# Patient Record
Sex: Female | Born: 1945 | Race: White | Hispanic: No | Smoking: Former smoker
Health system: Southern US, Community
[De-identification: ages and names within clinical notes are randomized; demographics above are authoritative.]

## PROBLEM LIST (undated history)

## (undated) DIAGNOSIS — G500 Trigeminal neuralgia: Secondary | ICD-10-CM

## (undated) DIAGNOSIS — R7303 Prediabetes: Secondary | ICD-10-CM

## (undated) DIAGNOSIS — I10 Essential (primary) hypertension: Secondary | ICD-10-CM

---

## 2018-08-02 HISTORY — PX: CEREBRAL MICROVASCULAR DECOMPRESSION: SHX1328

## 2021-12-30 ENCOUNTER — Emergency Department (HOSPITAL_COMMUNITY): Payer: Medicare Other

## 2021-12-30 ENCOUNTER — Inpatient Hospital Stay (HOSPITAL_COMMUNITY)
Admission: EM | Admit: 2021-12-30 | Discharge: 2022-01-03 | DRG: 025 | Disposition: A | Discharge status: Home / Self Care | Payer: Medicare Other | Attending: Family Medicine | Admitting: Family Medicine

## 2021-12-30 ENCOUNTER — Encounter (HOSPITAL_COMMUNITY): Payer: Self-pay | Admitting: Emergency Medicine

## 2021-12-30 DIAGNOSIS — R9431 Abnormal electrocardiogram [ECG] [EKG]: Secondary | ICD-10-CM | POA: Diagnosis present

## 2021-12-30 DIAGNOSIS — Z885 Allergy status to narcotic agent status: Secondary | ICD-10-CM

## 2021-12-30 DIAGNOSIS — Z833 Family history of diabetes mellitus: Secondary | ICD-10-CM

## 2021-12-30 DIAGNOSIS — F40240 Claustrophobia: Secondary | ICD-10-CM | POA: Diagnosis present

## 2021-12-30 DIAGNOSIS — C8519 Unspecified B-cell lymphoma, extranodal and solid organ sites: Secondary | ICD-10-CM | POA: Diagnosis not present

## 2021-12-30 DIAGNOSIS — R7303 Prediabetes: Secondary | ICD-10-CM | POA: Diagnosis present

## 2021-12-30 DIAGNOSIS — G936 Cerebral edema: Secondary | ICD-10-CM | POA: Diagnosis present

## 2021-12-30 DIAGNOSIS — Z9181 History of falling: Secondary | ICD-10-CM

## 2021-12-30 DIAGNOSIS — G9389 Other specified disorders of brain: Principal | ICD-10-CM

## 2021-12-30 DIAGNOSIS — I471 Supraventricular tachycardia: Secondary | ICD-10-CM | POA: Diagnosis present

## 2021-12-30 DIAGNOSIS — I4891 Unspecified atrial fibrillation: Secondary | ICD-10-CM | POA: Diagnosis present

## 2021-12-30 DIAGNOSIS — I10 Essential (primary) hypertension: Secondary | ICD-10-CM | POA: Diagnosis present

## 2021-12-30 DIAGNOSIS — Z20822 Contact with and (suspected) exposure to covid-19: Secondary | ICD-10-CM | POA: Diagnosis present

## 2021-12-30 DIAGNOSIS — Z8249 Family history of ischemic heart disease and other diseases of the circulatory system: Secondary | ICD-10-CM

## 2021-12-30 DIAGNOSIS — Z79899 Other long term (current) drug therapy: Secondary | ICD-10-CM

## 2021-12-30 DIAGNOSIS — I4719 Other supraventricular tachycardia: Secondary | ICD-10-CM | POA: Diagnosis present

## 2021-12-30 DIAGNOSIS — Z7983 Long term (current) use of bisphosphonates: Secondary | ICD-10-CM

## 2021-12-30 DIAGNOSIS — M25551 Pain in right hip: Secondary | ICD-10-CM | POA: Diagnosis present

## 2021-12-30 DIAGNOSIS — Z87891 Personal history of nicotine dependence: Secondary | ICD-10-CM

## 2021-12-30 DIAGNOSIS — G939 Disorder of brain, unspecified: Secondary | ICD-10-CM | POA: Diagnosis present

## 2021-12-30 DIAGNOSIS — M25511 Pain in right shoulder: Secondary | ICD-10-CM | POA: Diagnosis present

## 2021-12-30 DIAGNOSIS — Z888 Allergy status to other drugs, medicaments and biological substances status: Secondary | ICD-10-CM

## 2021-12-30 HISTORY — DX: Prediabetes: R73.03

## 2021-12-30 HISTORY — DX: Trigeminal neuralgia: G50.0

## 2021-12-30 HISTORY — DX: Essential (primary) hypertension: I10

## 2021-12-30 LAB — TROPONIN I (HIGH SENSITIVITY)
Troponin I (High Sensitivity): 9 ng/L (ref <18)
Troponin I (High Sensitivity): 14 ng/L (ref <18)

## 2021-12-30 LAB — CBC WITH DIFFERENTIAL/PLATELET
Abs Immature Granulocytes: 0.06 10*3/uL (ref 0.00–0.07)
Basophils Absolute: 0 10*3/uL (ref 0.0–0.1)
Basophils Relative: 0 %
Eosinophils Absolute: 0 10*3/uL (ref 0.0–0.5)
Eosinophils Relative: 0 %
HCT: 43.6 % (ref 36.0–46.0)
Hemoglobin: 13.6 g/dL (ref 12.0–15.0)
Immature Granulocytes: 1 %
Lymphocytes Relative: 6 %
Lymphs Abs: 0.7 10*3/uL (ref 0.7–4.0)
MCH: 27.8 pg (ref 26.0–34.0)
MCHC: 31.2 g/dL (ref 30.0–36.0)
MCV: 89 fL (ref 80.0–100.0)
Monocytes Absolute: 0.7 10*3/uL (ref 0.1–1.0)
Monocytes Relative: 6 %
Neutro Abs: 10.2 10*3/uL — ABNORMAL HIGH (ref 1.7–7.7)
Neutrophils Relative %: 87 %
Platelets: 265 10*3/uL (ref 150–400)
RBC: 4.9 MIL/uL (ref 3.87–5.11)
RDW: 12.1 % (ref 11.5–15.5)
WBC: 11.7 10*3/uL — ABNORMAL HIGH (ref 4.0–10.5)
nRBC: 0 % (ref 0.0–0.2)

## 2021-12-30 LAB — COMPREHENSIVE METABOLIC PANEL
ALT: 38 U/L (ref 0–44)
AST: 31 U/L (ref 15–41)
Albumin: 3.6 g/dL (ref 3.5–5.0)
Alkaline Phosphatase: 59 U/L (ref 38–126)
Anion gap: 10 (ref 5–15)
BUN: 13 mg/dL (ref 8–23)
CO2: 24 mmol/L (ref 22–32)
Calcium: 8.6 mg/dL — ABNORMAL LOW (ref 8.9–10.3)
Chloride: 102 mmol/L (ref 98–111)
Creatinine, Ser: 0.82 mg/dL (ref 0.44–1.00)
GFR, Estimated: >60 mL/min (ref >60)
Glucose, Bld: 152 mg/dL — ABNORMAL HIGH (ref 70–99)
Potassium: 3.6 mmol/L (ref 3.5–5.1)
Sodium: 136 mmol/L (ref 135–145)
Total Bilirubin: 1.1 mg/dL (ref 0.3–1.2)
Total Protein: 6.3 g/dL — ABNORMAL LOW (ref 6.5–8.1)

## 2021-12-30 LAB — RESP PANEL BY RT-PCR (FLU A&B, COVID) ARPGX2
Influenza A by PCR: NEGATIVE
Influenza B by PCR: NEGATIVE
SARS Coronavirus 2 by RT PCR: NEGATIVE

## 2021-12-30 LAB — CBG MONITORING, ED: Glucose-Capillary: 148 mg/dL — ABNORMAL HIGH (ref 70–99)

## 2021-12-30 LAB — CK: Total CK: 105 U/L (ref 38–234)

## 2021-12-30 IMAGING — MR MR HEAD W/O CM
11 of 12 series · 43 of 48 positions shown · non-contrast
Comparison: Prior CT from earlier the same day as well as previous
MRI from [DATE].

CLINICAL DATA: Follow-up examination for abnormal CT, possible
intracranial mass.

EXAM:
MRI HEAD WITHOUT CONTRAST
TECHNIQUE: Multiplanar, multiecho pulse sequences of the brain and surrounding
structures were obtained without intravenous contrast.

[Series 11: DWI · axial · 3.0mm · 0.88mm/px · z∈[-94,+51]mm · 7 of 100 slices shown (1 of 4)]
[im 1/100]
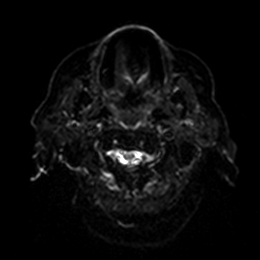
[im 17/100]
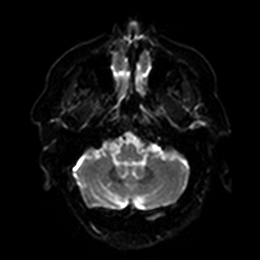
[im 34/100]
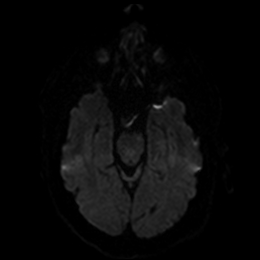
[im 50/100]
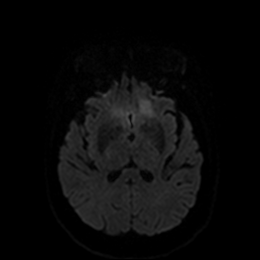
[im 67/100]
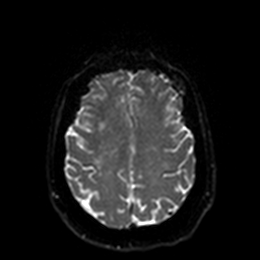
[im 83/100]
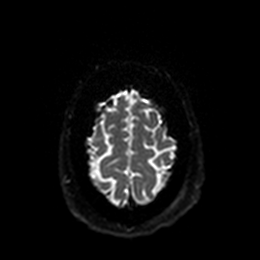
[im 100/100]
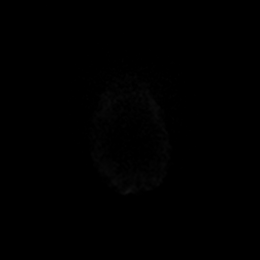

[Series 12: DWI · axial · 3.0mm · 0.88mm/px · z∈[-94,+51]mm · 4 of 50 slices shown (2 of 4)]
[im 1/50]
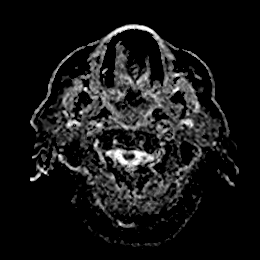
[im 17/50]
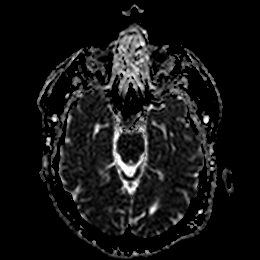
[im 33/50]
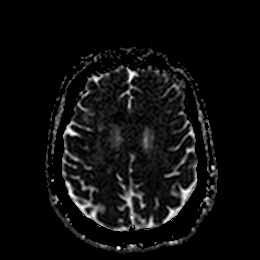
[im 50/50]
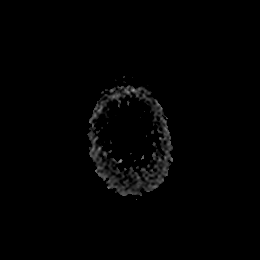

[Series 13: DWI · coronal · 4.0mm · 0.88mm/px · 5 of 68 slices shown (3 of 4)]
[im 1/68]
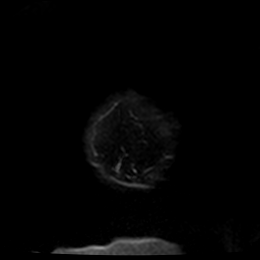
[im 17/68]
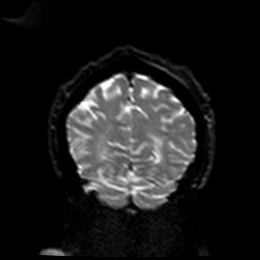
[im 34/68]
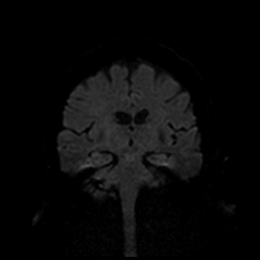
[im 51/68]
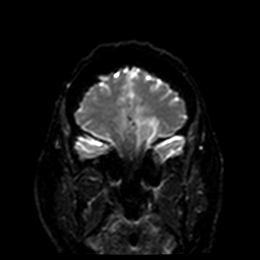
[im 68/68]
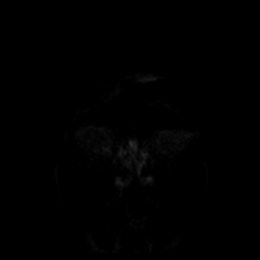

[Series 14: DWI · coronal · 4.0mm · 0.88mm/px · 3 of 34 slices shown (4 of 4)]
[im 1/34]
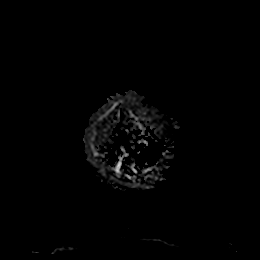
[im 17/34]
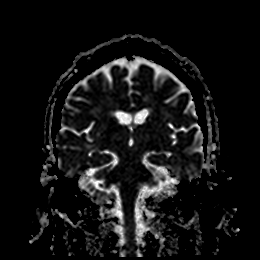
[im 34/34]
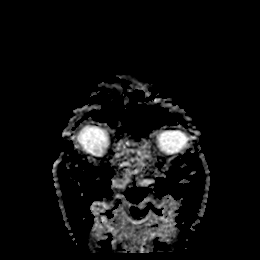

[Series 15: T1 · sagittal · 5.0mm · 0.75mm/px · 2 of 26 slices shown]
[im 1/26]
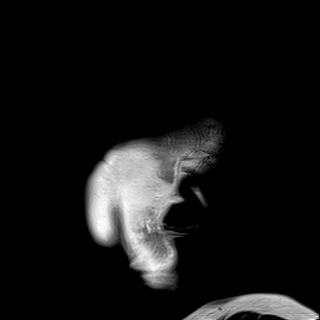
[im 26/26]
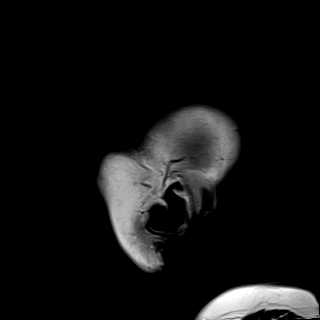

[Series 16: T2 · axial · 5.0mm · 0.72mm/px · z∈[-110,+51]mm · 2 of 28 slices shown]
[im 1/28]
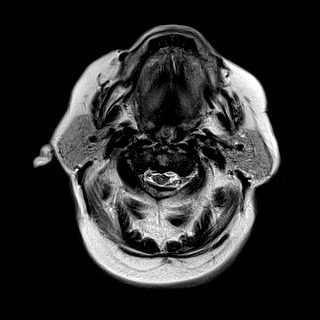
[im 28/28]
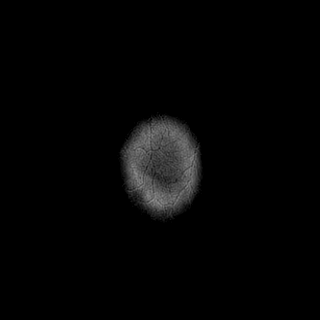

[Series 17: FLAIR · axial · 5.0mm · 0.45mm/px · z∈[-111,+51]mm · 2 of 28 slices shown]
[im 1/28]
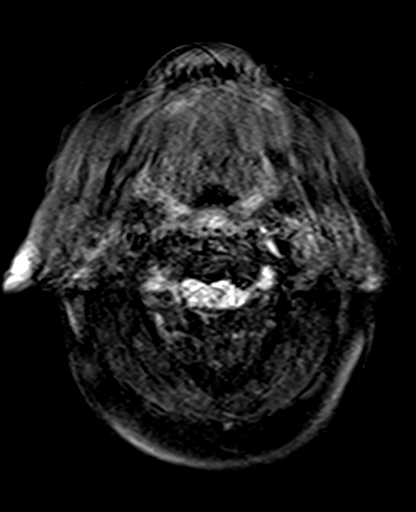
[im 28/28]
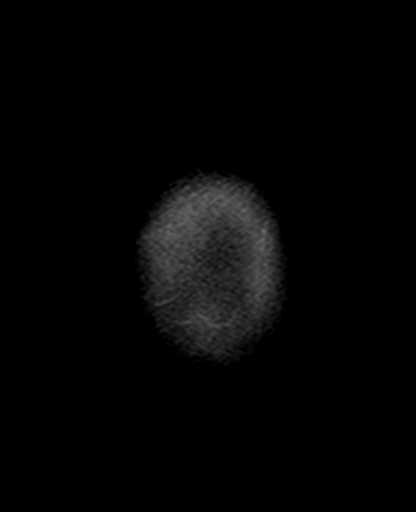

[Series 18: mag_images · axial · 3.0mm · 0.90mm/px · z∈[-116,+60]mm · 5 of 60 slices shown]
[im 1/60]
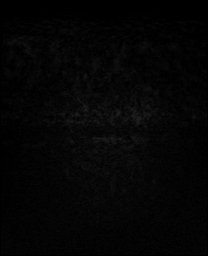
[im 15/60]
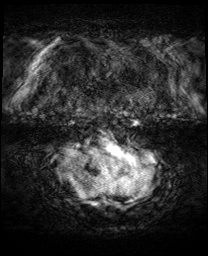
[im 30/60]
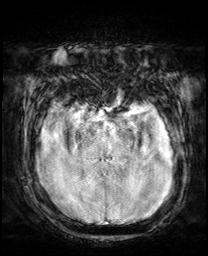
[im 45/60]
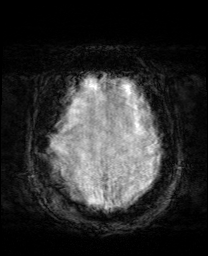
[im 60/60]
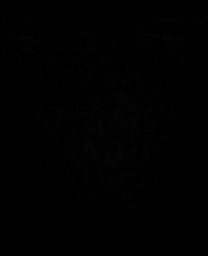

[Series 19: pha_images · axial · 3.0mm · 0.90mm/px · z∈[-96,+57]mm · 4 of 52 slices shown]
[im 1/52]
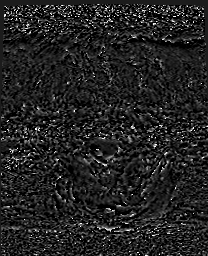
[im 18/52]
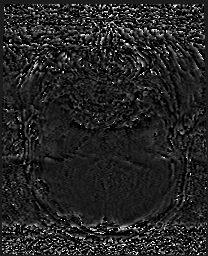
[im 35/52]
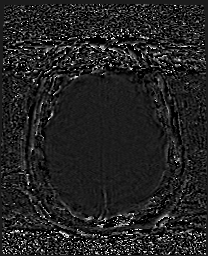
[im 52/52]
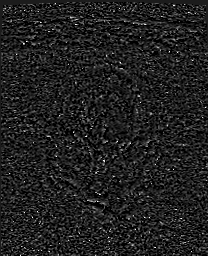

[Series 20: swi_images · axial · 3.0mm · 0.90mm/px · z∈[-116,+60]mm · 5 of 60 slices shown]
[im 1/60]
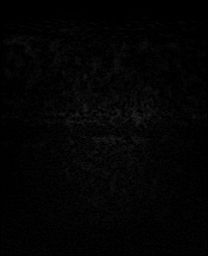
[im 15/60]
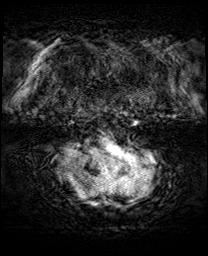
[im 30/60]
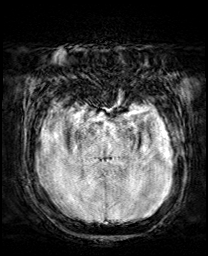
[im 45/60]
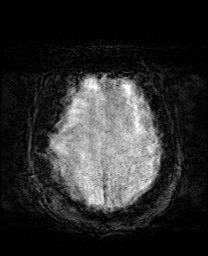
[im 60/60]
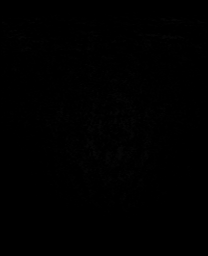

[Series 21: mip_images(sw) · axial · 24.0mm · 0.90mm/px · z∈[-106,+49]mm · 4 of 53 slices shown]
[im 1/53]
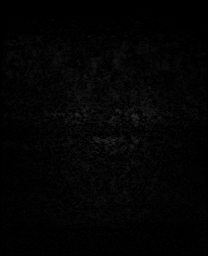
[im 18/53]
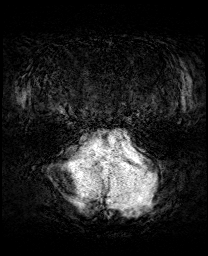
[im 35/53]
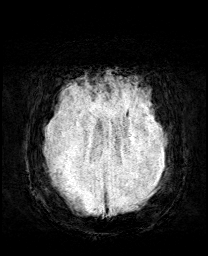
[im 53/53]
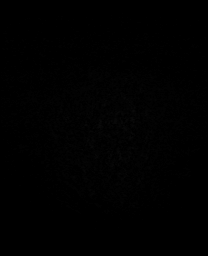

[43 of 48 positions shown; findings below may reference images not displayed]

FINDINGS: Brain: Examination technically limited as the patient was unable to
tolerate the full length of the exam, postcontrast sequences were
not performed. Additionally, images provided are moderately to
severely intermittently degraded by motion artifact.

Diffuse prominence of the CSF containing spaces compatible
generalized cerebral atrophy. Patchy and confluent T2/FLAIR
hyperintensity involving the periventricular deep white matter both
cerebral hemispheres most consistent with chronic small vessel
ischemic disease, moderate in nature. Small remote right cerebellar
infarct noted.

There is suggestion of a focal intraparenchymal abnormality centered
at the anterior/inferior left frontal lobe at the left gyrus rectus,
corresponding with abnormality on prior CT. This measures
approximately 2.3 x 1.3 cm (series 16, image 16). Surrounding
T2/FLAIR signal abnormality consistent with vasogenic edema. Edema
extends along the genu of the corpus callosum, crossing the midline
to involve the contralateral right frontal lobe (series 17, image
17). No definite intrinsic T1 hyperintensity. No visible associated
susceptibility artifact, although evaluation markedly limited by
motion. Finding is indeterminate, and could reflect an
intraparenchymal mass, possibly a primary CNS neoplasm or solitary
intracranial metastasis. A possible subacute hematoma is also
considered, although evaluation limited given artifact on this exam.

No other visible mass lesion, mass effect, or midline shift. No
hydrocephalus or extra-axial fluid collection. No other foci of
diffusion abnormality to suggest acute or subacute ischemia.
Gray-white matter differentiation otherwise maintained.

Pituitary gland suprasellar region normal. Midline structures
intact.

Vascular: Major intracranial vascular flow voids are grossly
maintained at the skull base.

Skull and upper cervical spine: Prominent degenerative
thickening/pannus formation noted at the tectorial membrane and
dens. Craniocervical junction otherwise unremarkable. Bone marrow
signal intensity within normal limits. No focal marrow replacing
lesion. Hyperostosis frontalis interna noted. Prior right
retrosigmoid craniotomy noted. No acute scalp soft tissue
abnormality.

Sinuses/Orbits: Patient status post bilateral ocular lens
replacement. Scattered mucosal thickening noted within the ethmoidal
air cells. Paranasal sinuses are otherwise clear. No mastoid
effusion.

Other: None.
IMPRESSION: 1. Technically limited exam due to motion artifact and the patient's
inability to tolerate the full length of the study. Post-contrast
sequences were not performed.
2. Approximate 2.3 x 1.3 cm intraparenchymal abnormality centered at
the anterior/inferior left frontal lobe, corresponding with
abnormality on prior CT. Surrounding vasogenic edema without midline
shift. Finding is indeterminate, and could reflect an
intraparenchymal mass, possibly a primary CNS neoplasm or solitary
intracranial metastasis. A possible evolving subacute hematoma is
also considered. Further assessment with postcontrast imaging as the
patient is able to tolerate is recommended. Additionally, a short
interval follow-up MRI to evaluate for evolutionary changes may be
helpful as well.
3. No other acute intracranial abnormality.
4. Underlying atrophy with moderate chronic small vessel ischemic
disease.

## 2021-12-30 IMAGING — CT CT HEAD W/O CM
4 series · 15 of 47 positions shown, 17 images · non-contrast
Comparison: MRI head [DATE]

CLINICAL DATA: Status post fall

EXAM:
CT HEAD WITHOUT CONTRAST
CT CERVICAL SPINE WITHOUT CONTRAST
TECHNIQUE: Multidetector CT imaging of the head and cervical spine was
performed following the standard protocol without intravenous
contrast. Multiplanar CT image reconstructions of the cervical spine
were also generated.

[Series 3: head bone · axial · 0.49mm/px · z∈[-104,-88]mm · 2 of 78 slices shown]
[im 8/78  bone]
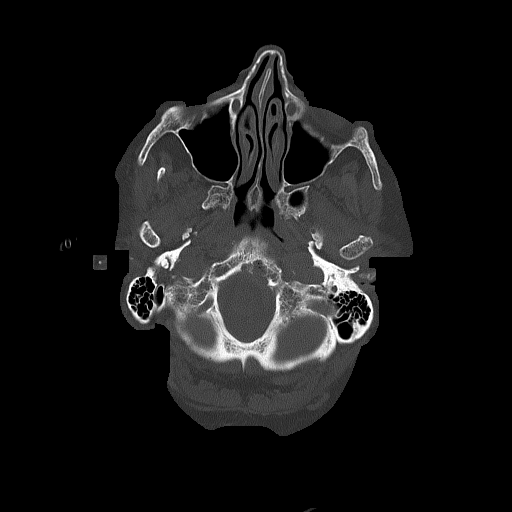
[im 16/78  bone]
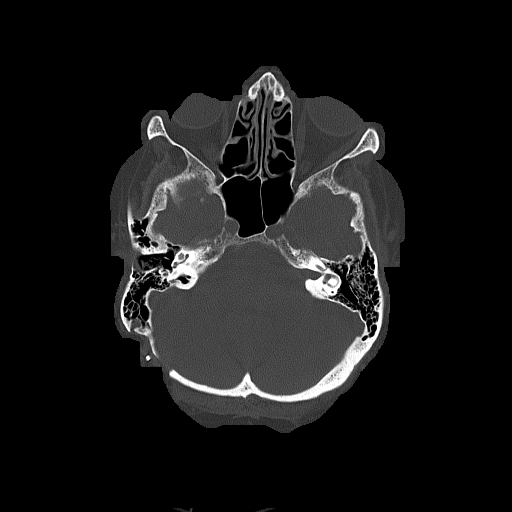

[Series 4: head without · axial · non-contrast · 0.49mm/px · z∈[-103,+17]mm · 7 of 32 slices shown, 9 images]
[im 4/32  brain]
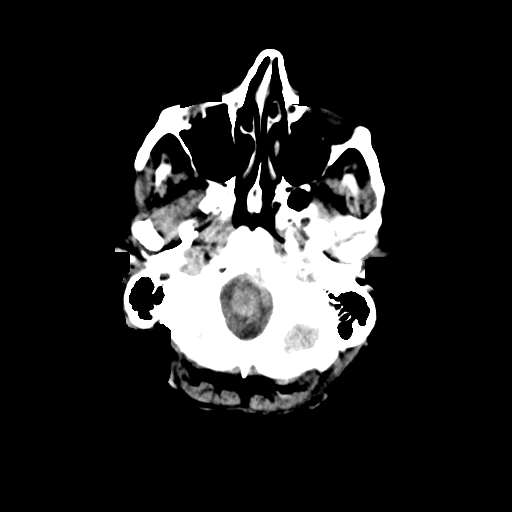
[im 4/32  bone]
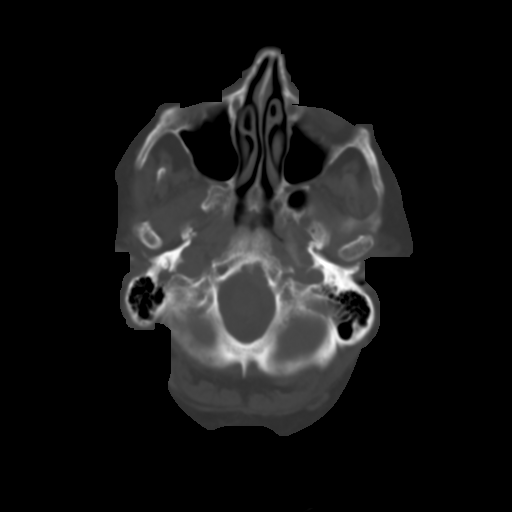
[im 8/32  brain]
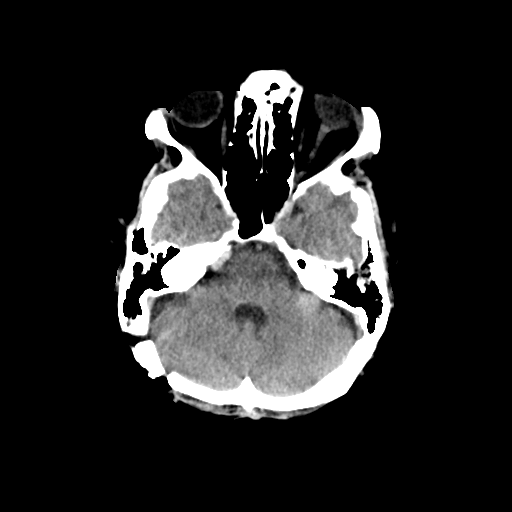
[im 12/32  brain]
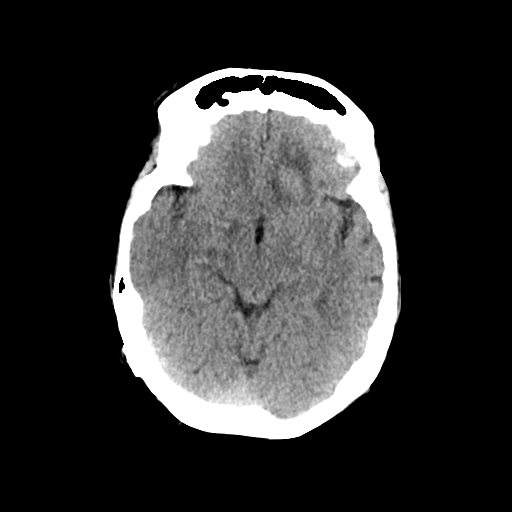
[im 16/32  brain]
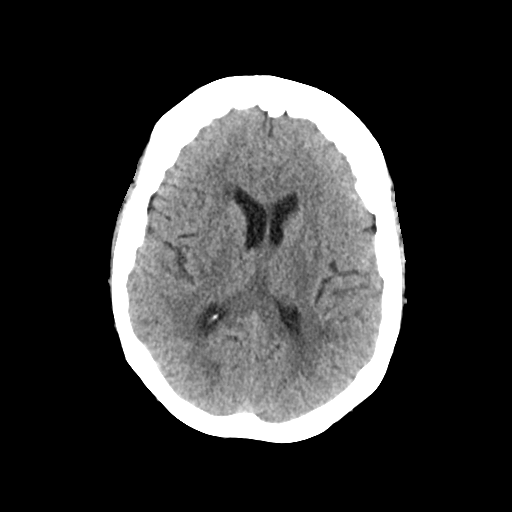
[im 20/32  brain]
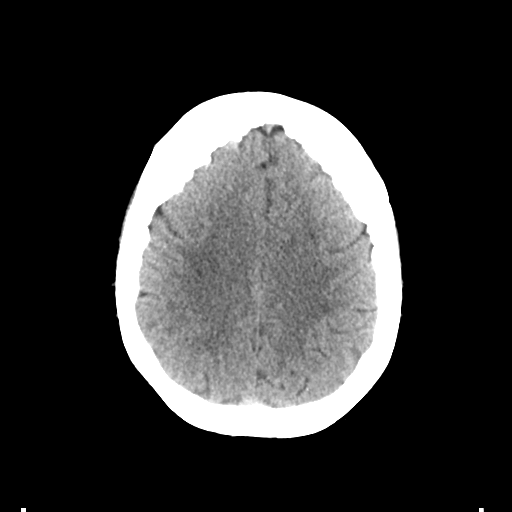
[im 20/32  bone]
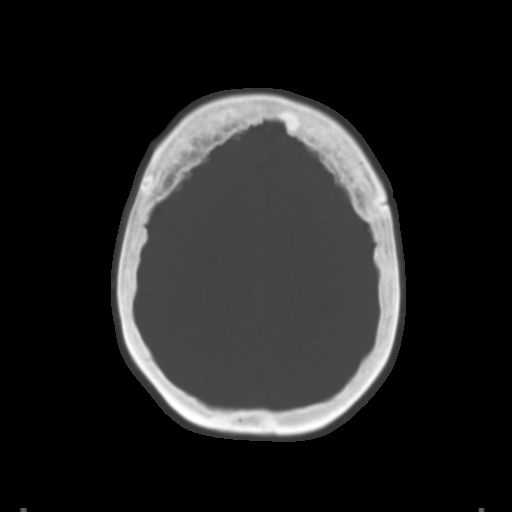
[im 24/32  brain]
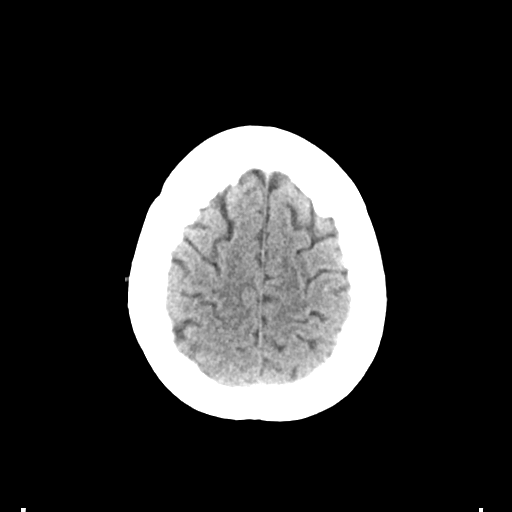
[im 28/32  brain]
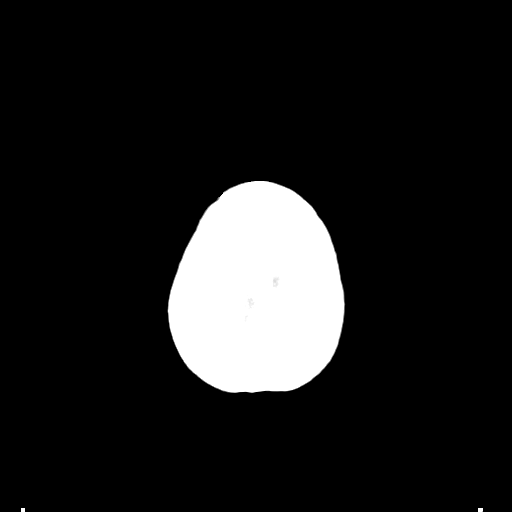

[Series 5: head without cor · coronal · non-contrast · 0.32mm/px · 3 of 67 slices shown]
[im 23/67  brain]
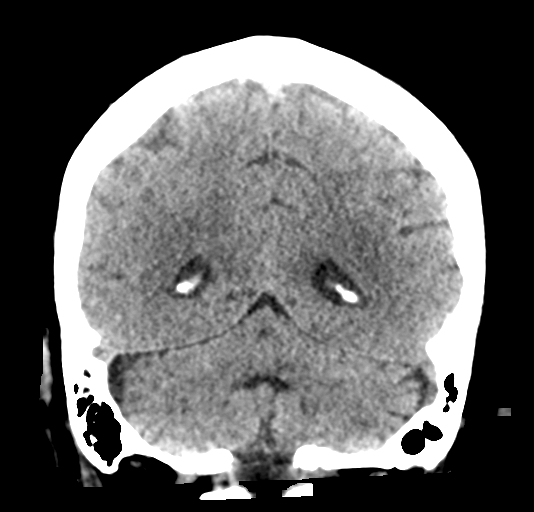
[im 30/67  brain]
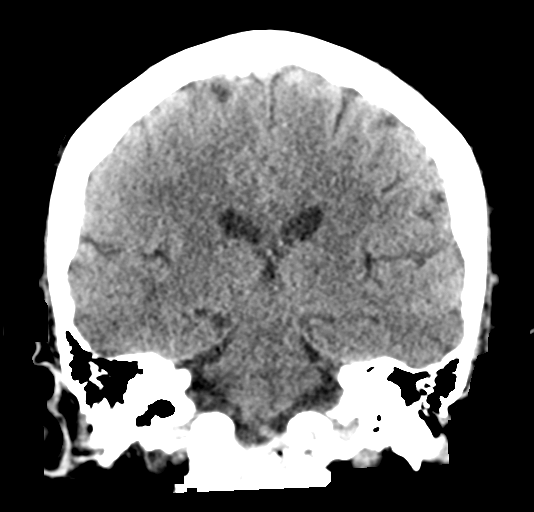
[im 37/67  brain]
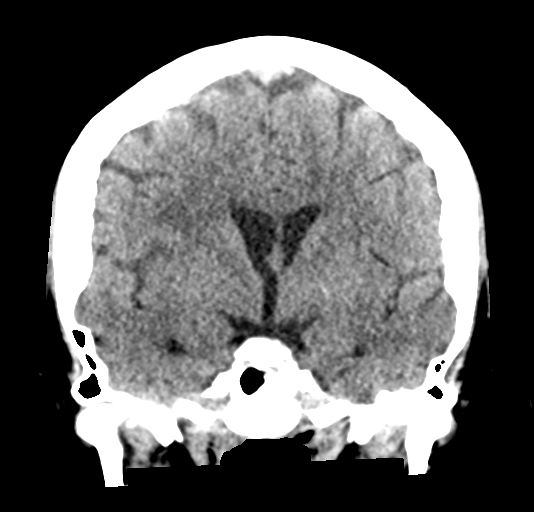

[Series 6: head without sag · sagittal · non-contrast · 0.31mm/px · 3 of 67 slices shown]
[im 23/67  brain]
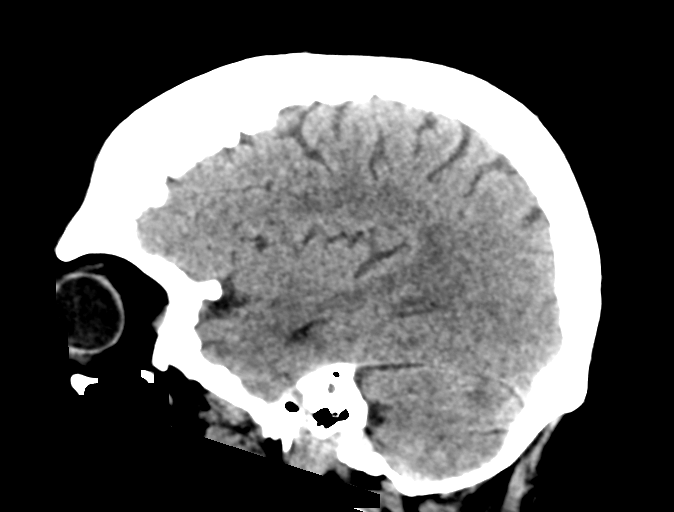
[im 34/67  brain]
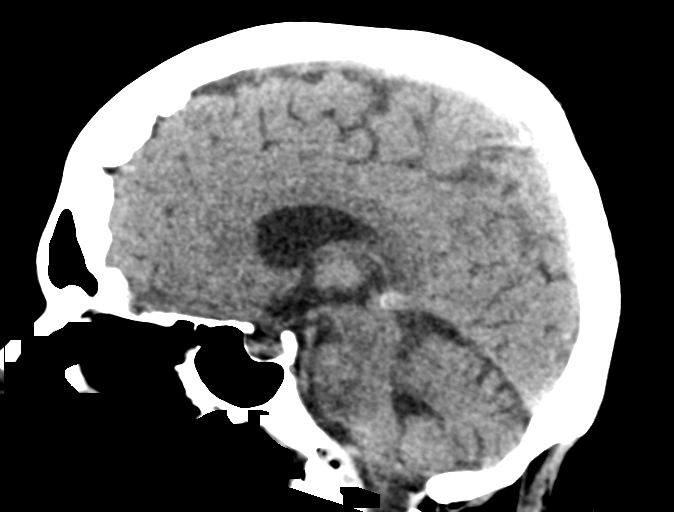
[im 45/67  brain]
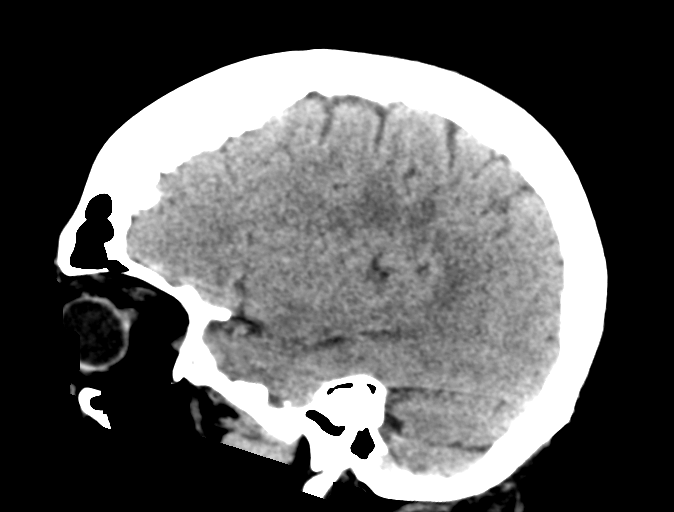

[15 of 47 positions shown; findings below may reference images not displayed]

FINDINGS: CT HEAD FINDINGS

BRAIN:
BRAIN
Patchy and confluent areas of decreased attenuation are noted
throughout the deep and periventricular white matter of the cerebral
hemispheres bilaterally, compatible with chronic microvascular
ischemic disease.

No evidence of large-territorial acute infarction. No parenchymal
hemorrhage. Suggestion of a 1.9 x 1.2 x 2cm left inferior frontal
lobe lesion with surrounding vasogenic edema. No extra-axial
collection.

No mass effect or midline shift. No hydrocephalus. Basilar cisterns
are patent.

Vascular: No hyperdense vessel.

Skull: No acute fracture or focal lesion.

Sinuses/Orbits: Paranasal sinuses and mastoid air cells are clear.
The orbits are unremarkable.

Other: None.

CT CERVICAL SPINE FINDINGS

Alignment: Normal.

Skull base and vertebrae: Pannus at the C1-C2 level with associated
erosive changes of the skull base/anterior foramen magnum.
Multilevel degenerative changes of the spine. Lytic lesion along the
base of the dens of unclear etiology. No acute fracture. No
aggressive appearing focal osseous lesion or focal pathologic
process.

Soft tissues and spinal canal: No prevertebral fluid or swelling. No
visible canal hematoma.

Upper chest: Unremarkable.

Other: None.
IMPRESSION: 1. In 1.9 x 1.2 x 2 cm left inferior frontal lobe lesion with
surrounding vasogenic edema. Recommend MRI brain with and without
contrast for further evaluation.
2. No acute displaced fracture or traumatic listhesis of the
cervical spine.
3. Lytic lesion along the base of the dens of unclear etiology.

## 2021-12-30 IMAGING — DX DG HIP (WITH OR WITHOUT PELVIS) 2-3V*L*
3 series · 3 of 3 positions shown · non-contrast
Comparison: None.

CLINICAL DATA: fall, back/hip pain

EXAM:
DG HIP (WITH OR WITHOUT PELVIS) 2-3V RIGHT; DG HIP (WITH OR WITHOUT
PELVIS) 2-3V LEFT

[hip lat]
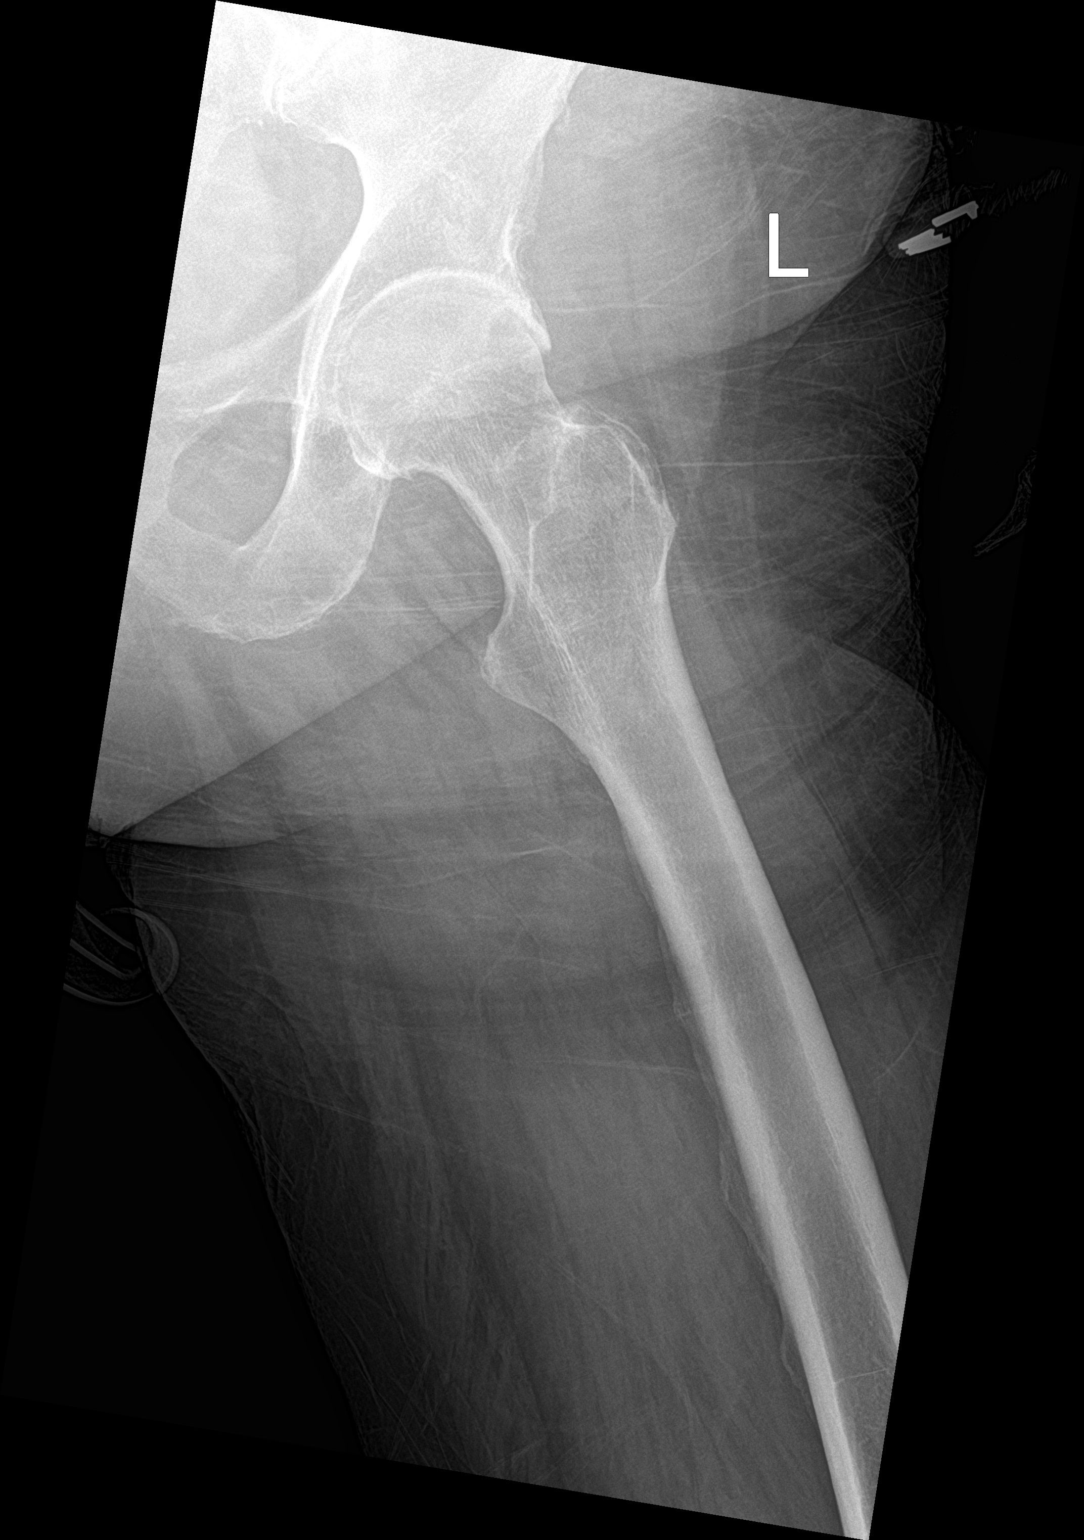

[pelvis ap]
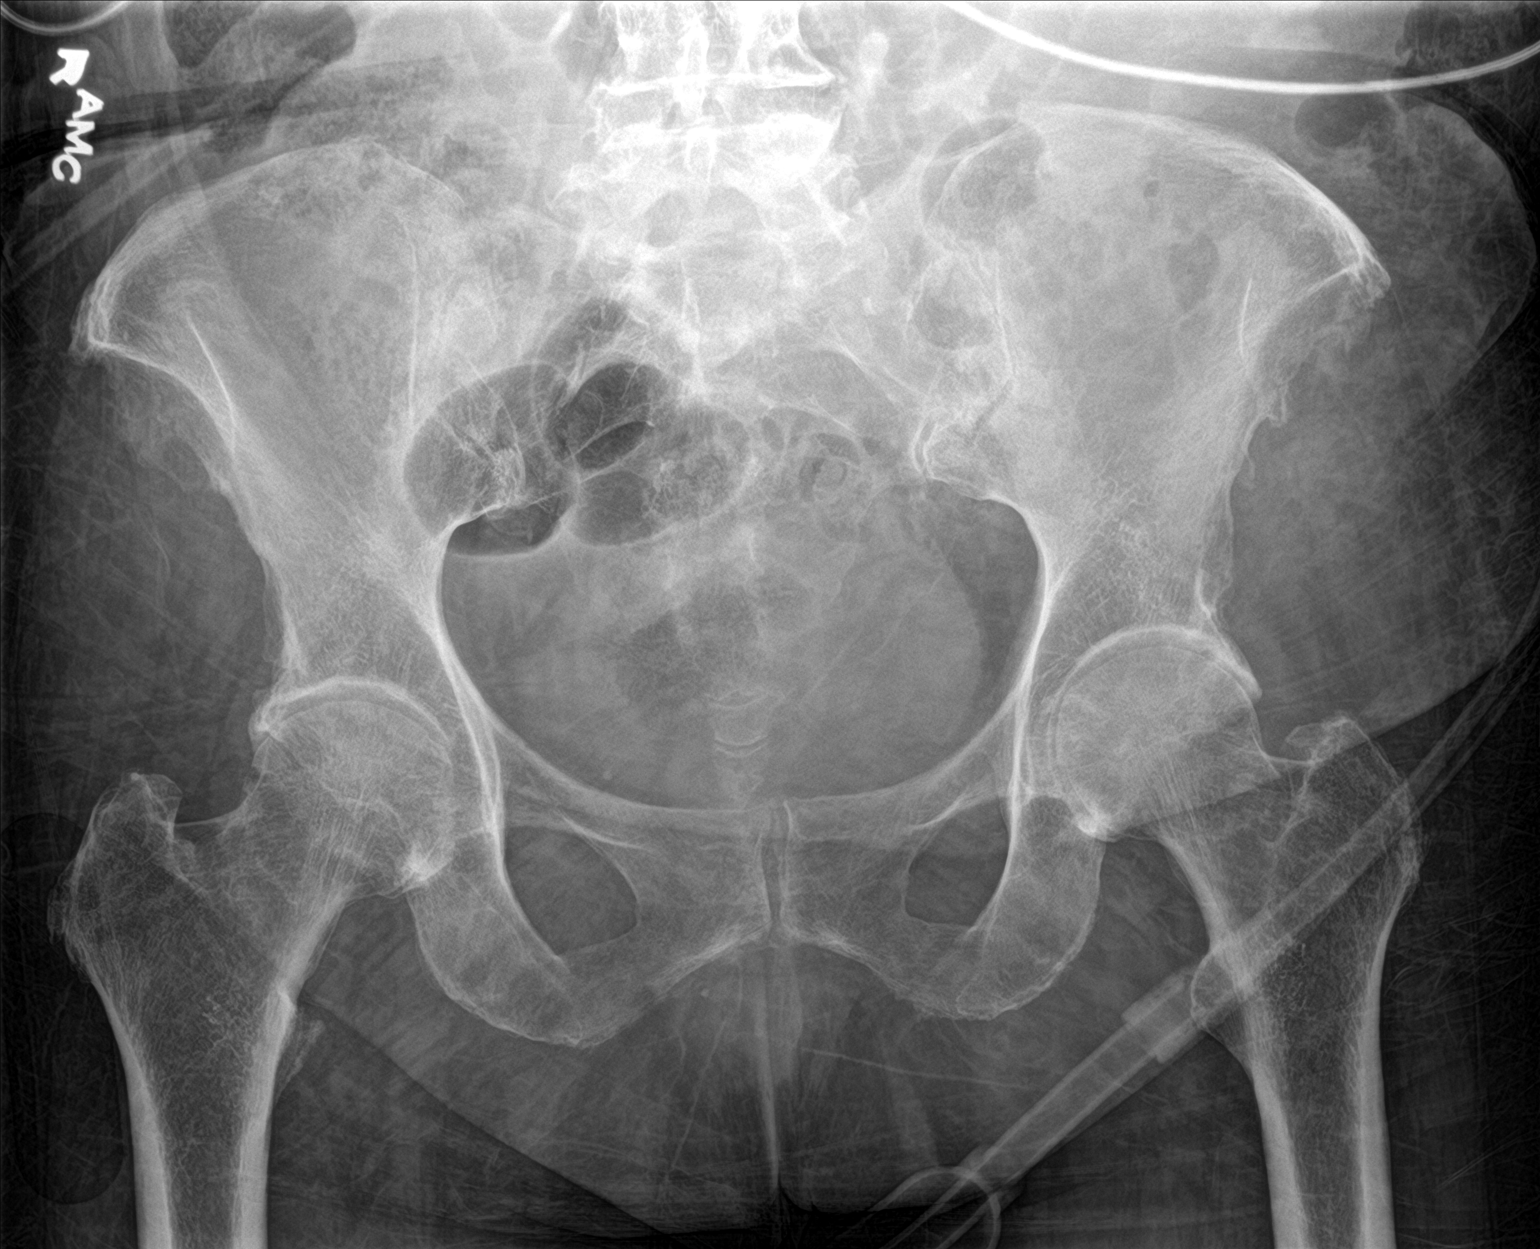

[hip ap]
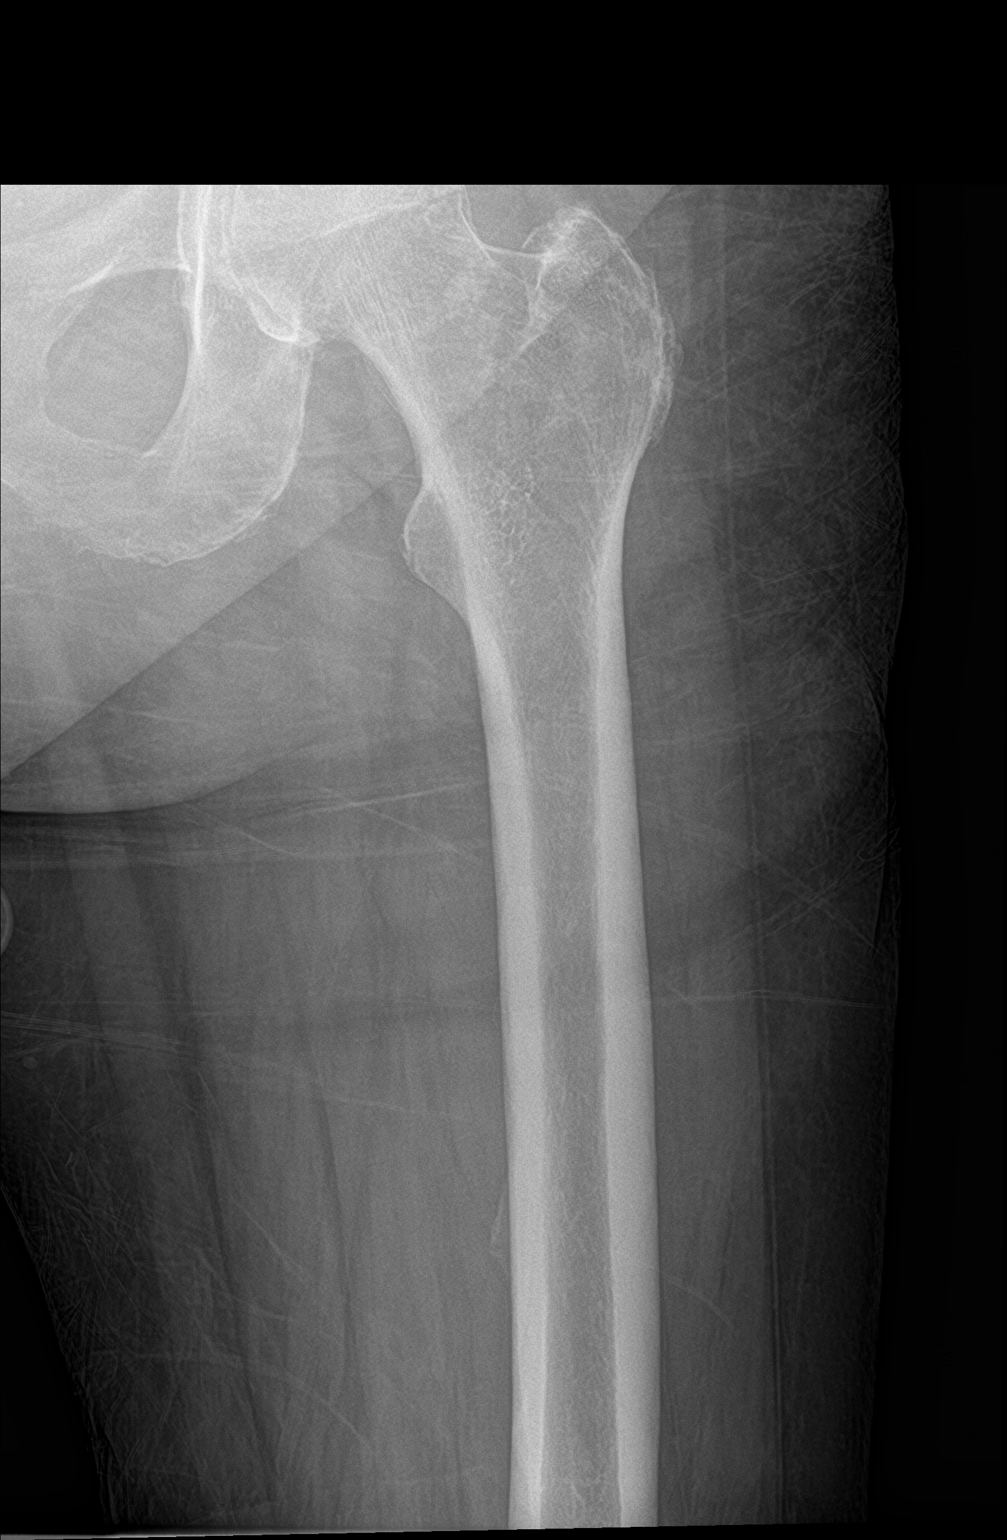

[3 of 3 positions shown; findings below may reference images not displayed]

FINDINGS: Markedly limited evaluation due to overlapping osseous structures
and overlying soft tissues. There is no evidence of hip fracture or
dislocation bilaterally. No acute displaced fracture or diastasis of
the bones of the pelvis. Visualized lower lumbar spine demonstrates
degenerative changes. Limited evaluation of the sacrum due to
overlying bowel. There is no evidence of severe arthropathy or other
focal bone abnormality.
IMPRESSION: Negative for acute traumatic injury.

## 2021-12-30 IMAGING — CT CT PELVIS W/O CM
2 of 3 series · 16 of 46 positions shown, 18 images · non-contrast
Comparison: X-ray bilateral hips [DATE]

CLINICAL DATA: Hip trauma, fracture suspected, xray done 3 status
post fall

EXAM:
CT PELVIS WITHOUT CONTRAST
TECHNIQUE: Multidetector CT imaging of the pelvis was performed following the
standard protocol without intravenous contrast.

[Series 5: pelvis 2.0 st · axial · 0.80mm/px · z∈[+776,+1018]mm · 13 of 139 slices shown, 15 images]
[im 9/139  soft-tissue]
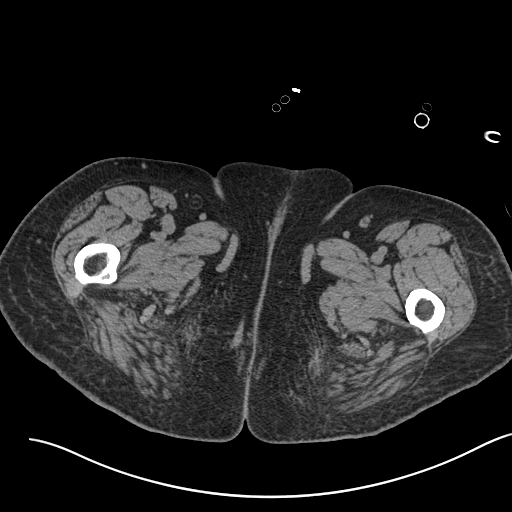
[im 9/139  bone]
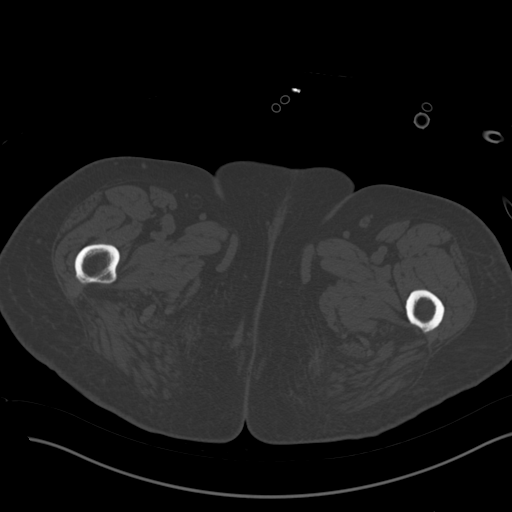
[im 18/139  soft-tissue]
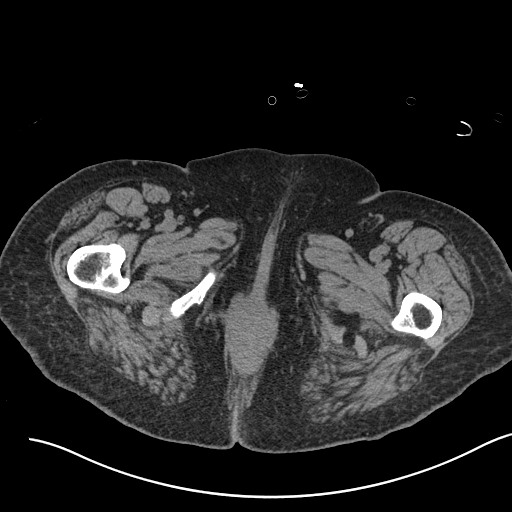
[im 27/139  soft-tissue]
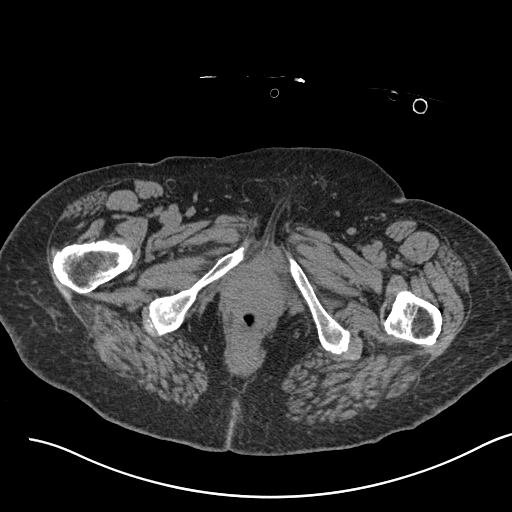
[im 41/139  soft-tissue]
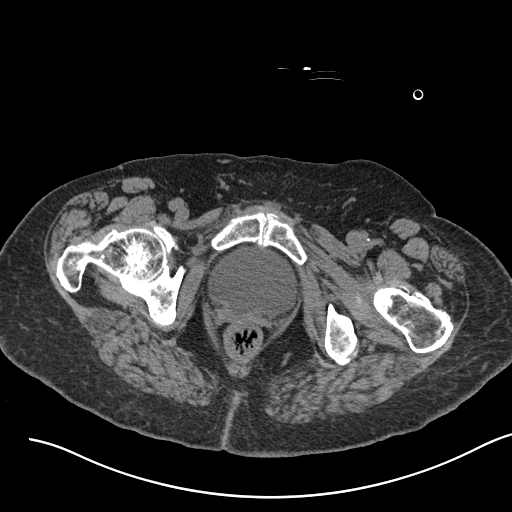
[im 49/139  soft-tissue]
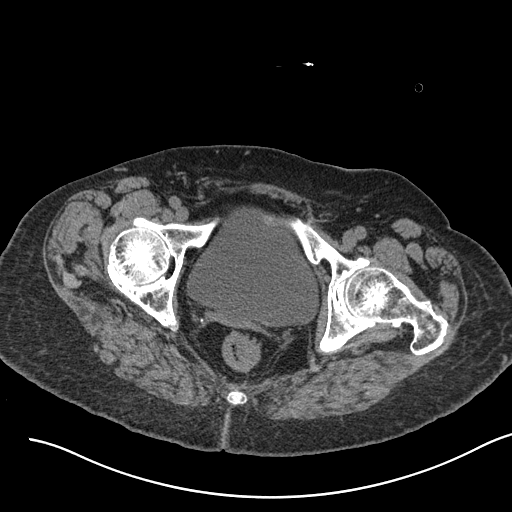
[im 58/139  soft-tissue]
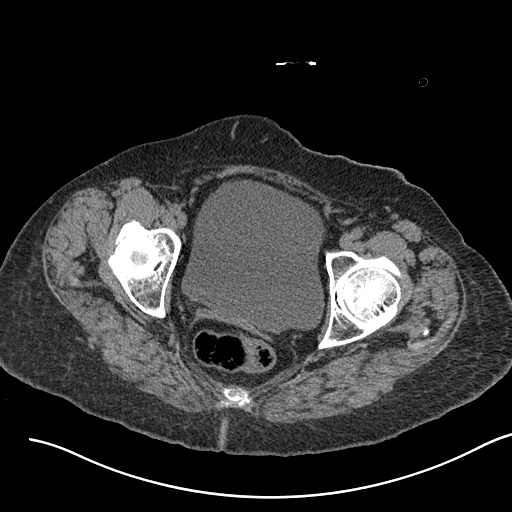
[im 72/139  soft-tissue]
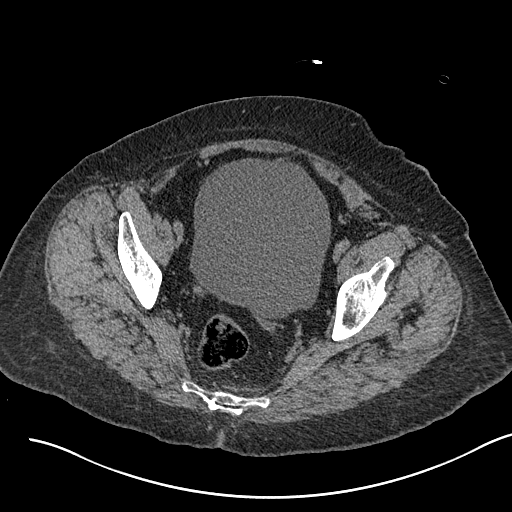
[im 81/139  soft-tissue]
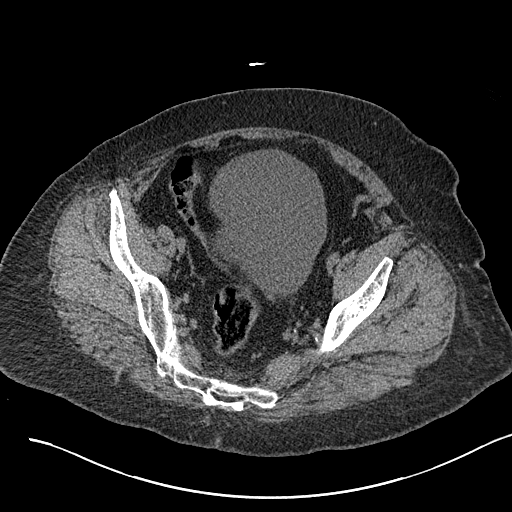
[im 90/139  soft-tissue]
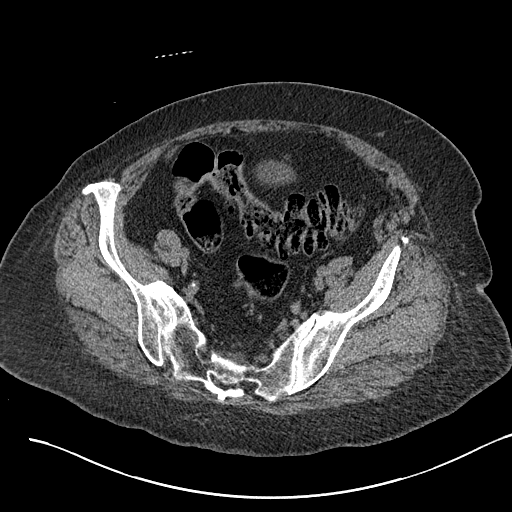
[im 90/139  bone]
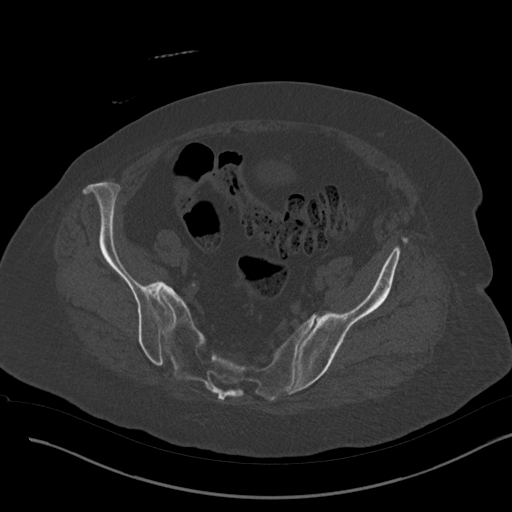
[im 98/139  soft-tissue]
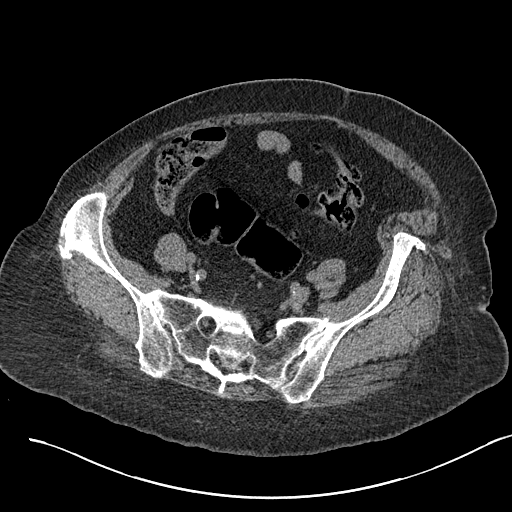
[im 112/139  soft-tissue]
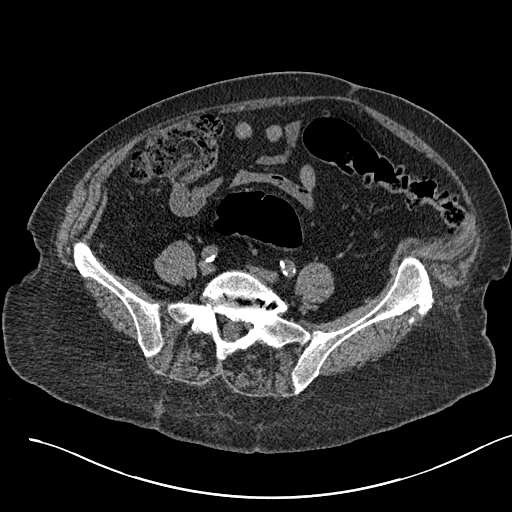
[im 121/139  soft-tissue]
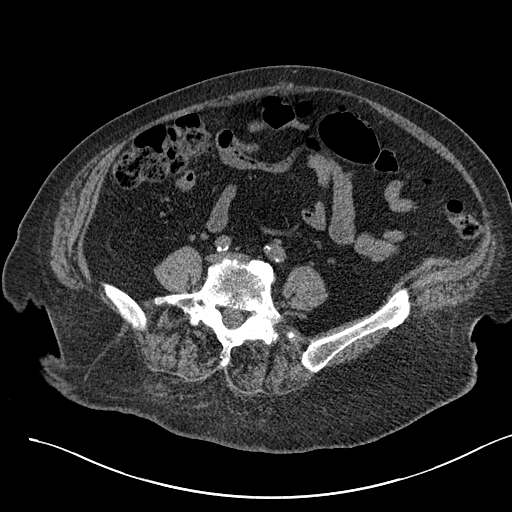
[im 130/139  soft-tissue]
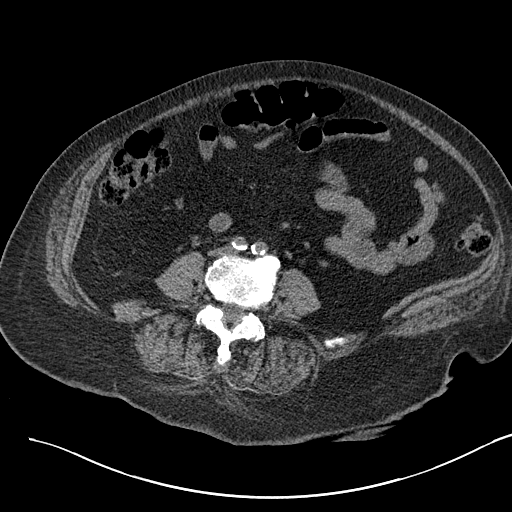

[Series 6: pelvis 2.0 cor. bone · coronal · 0.54mm/px · 3 of 138 slices shown]
[im 46/138  soft-tissue]
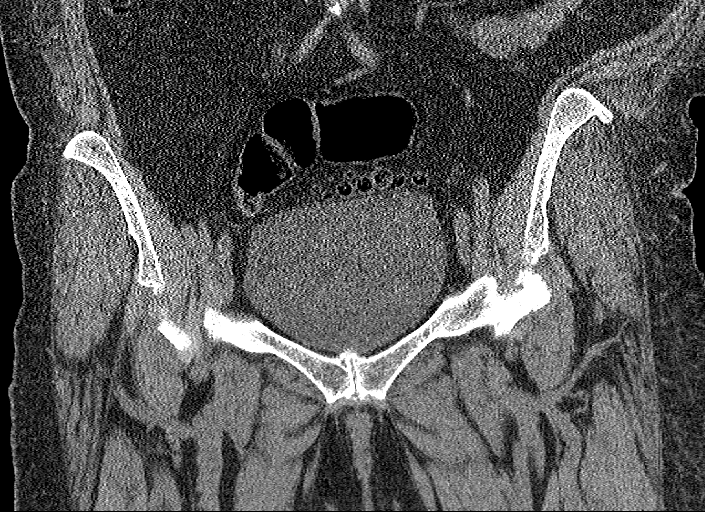
[im 61/138  soft-tissue]
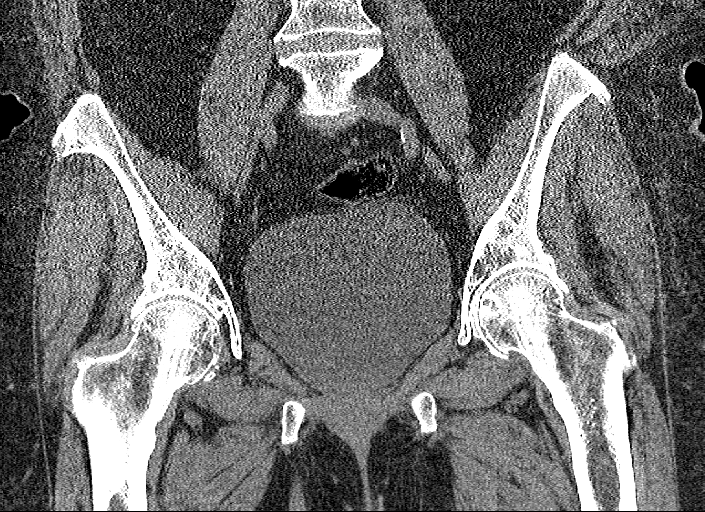
[im 77/138  soft-tissue]
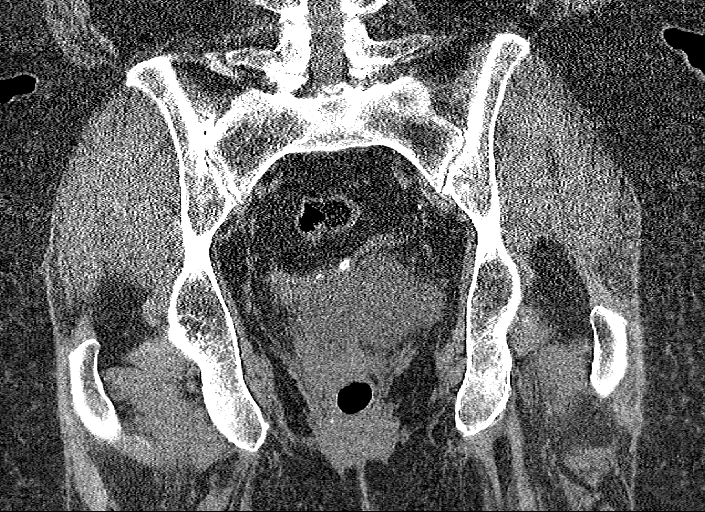

[16 of 46 positions shown; findings below may reference images not displayed]

FINDINGS: Urinary Tract:  No abnormality visualized.

Bowel: Scattered colonic diverticulosis. Otherwise unremarkable
visualized pelvic bowel loops.

Vascular/Lymphatic: Atherosclerotic plaque. No pathologically
enlarged lymph nodes. No significant vascular abnormality seen.

Reproductive:  No mass or other significant abnormality

Other: No intraperitoneal free fluid. No intraperitoneal free gas.
No organized fluid collection.

Musculoskeletal:

No large hematoma formation. Mild subcutaneus soft tissue edema
along the left hip.

No acute displaced fracture of the hips or pelvis. No pelvic bone
diastasis. Degenerative changes of visualized lower lumbar spine.
Lucency of the left acetabulum ([DATE] 2-56) likely represents a
nutrient vessel. At least mild degenerative changes of bilateral
hips. Old healed coccyx fracture with fusion to the S5 vertebral
body.
IMPRESSION: 1. Negative for acute traumatic injury.
2.  Aortic Atherosclerosis ([ZE]-[ZE]).

## 2021-12-30 IMAGING — DX DG HIP (WITH OR WITHOUT PELVIS) 2-3V*R*
2 series · 2 of 2 positions shown · non-contrast
Comparison: None.

CLINICAL DATA: fall, back/hip pain

EXAM:
DG HIP (WITH OR WITHOUT PELVIS) 2-3V RIGHT; DG HIP (WITH OR WITHOUT
PELVIS) 2-3V LEFT

[hip ap]
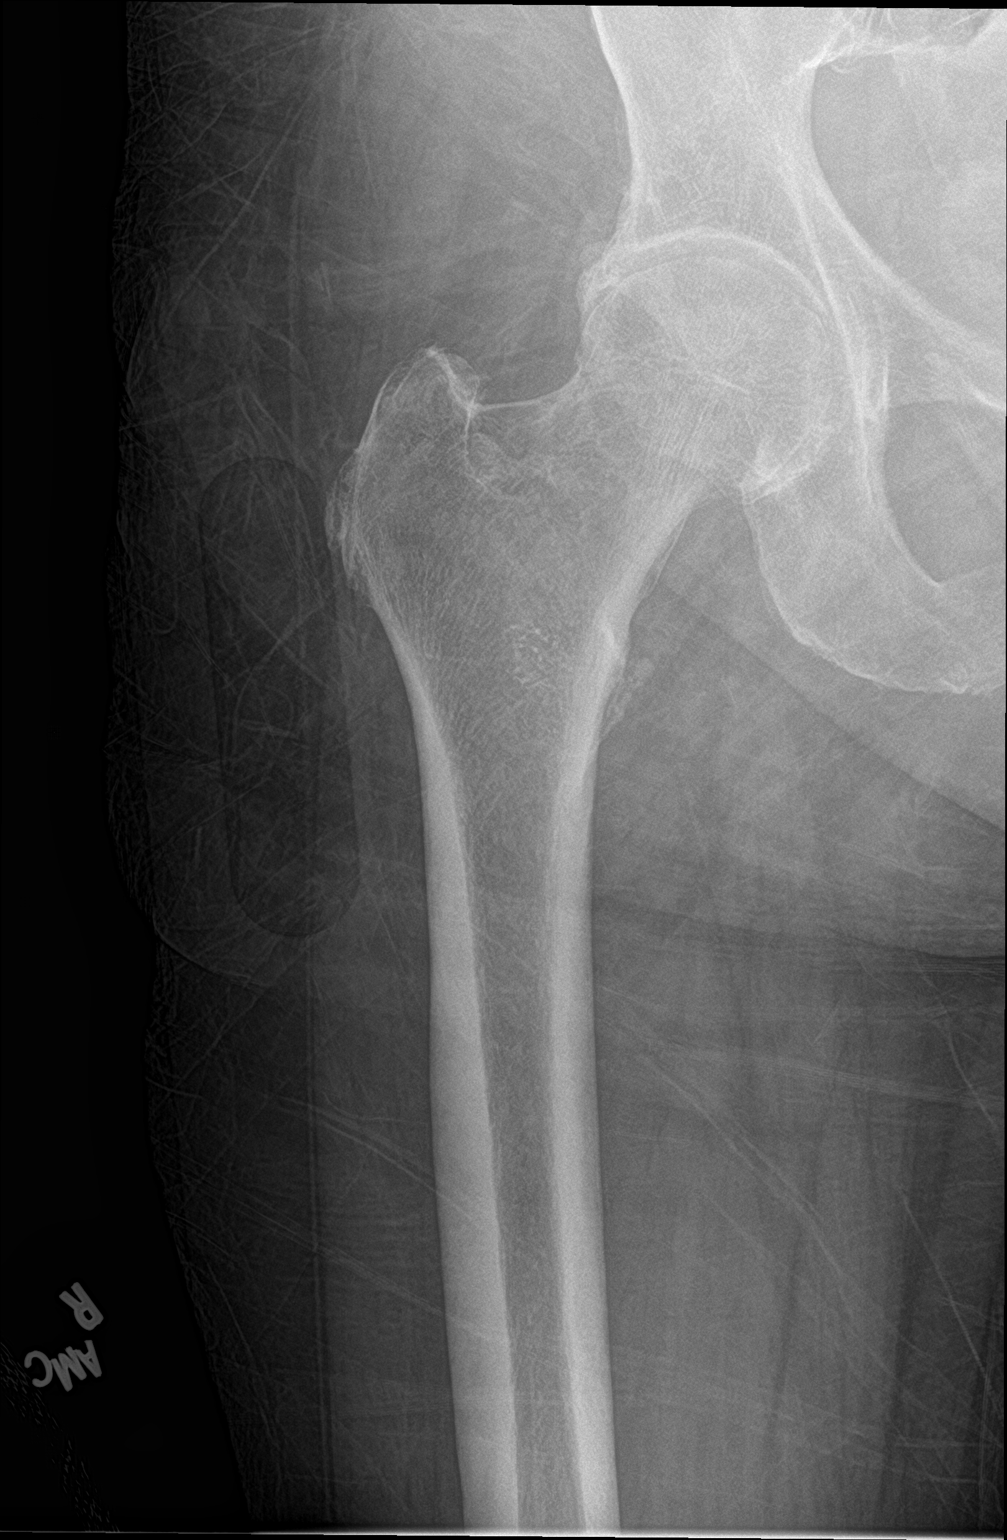

[hip lat]
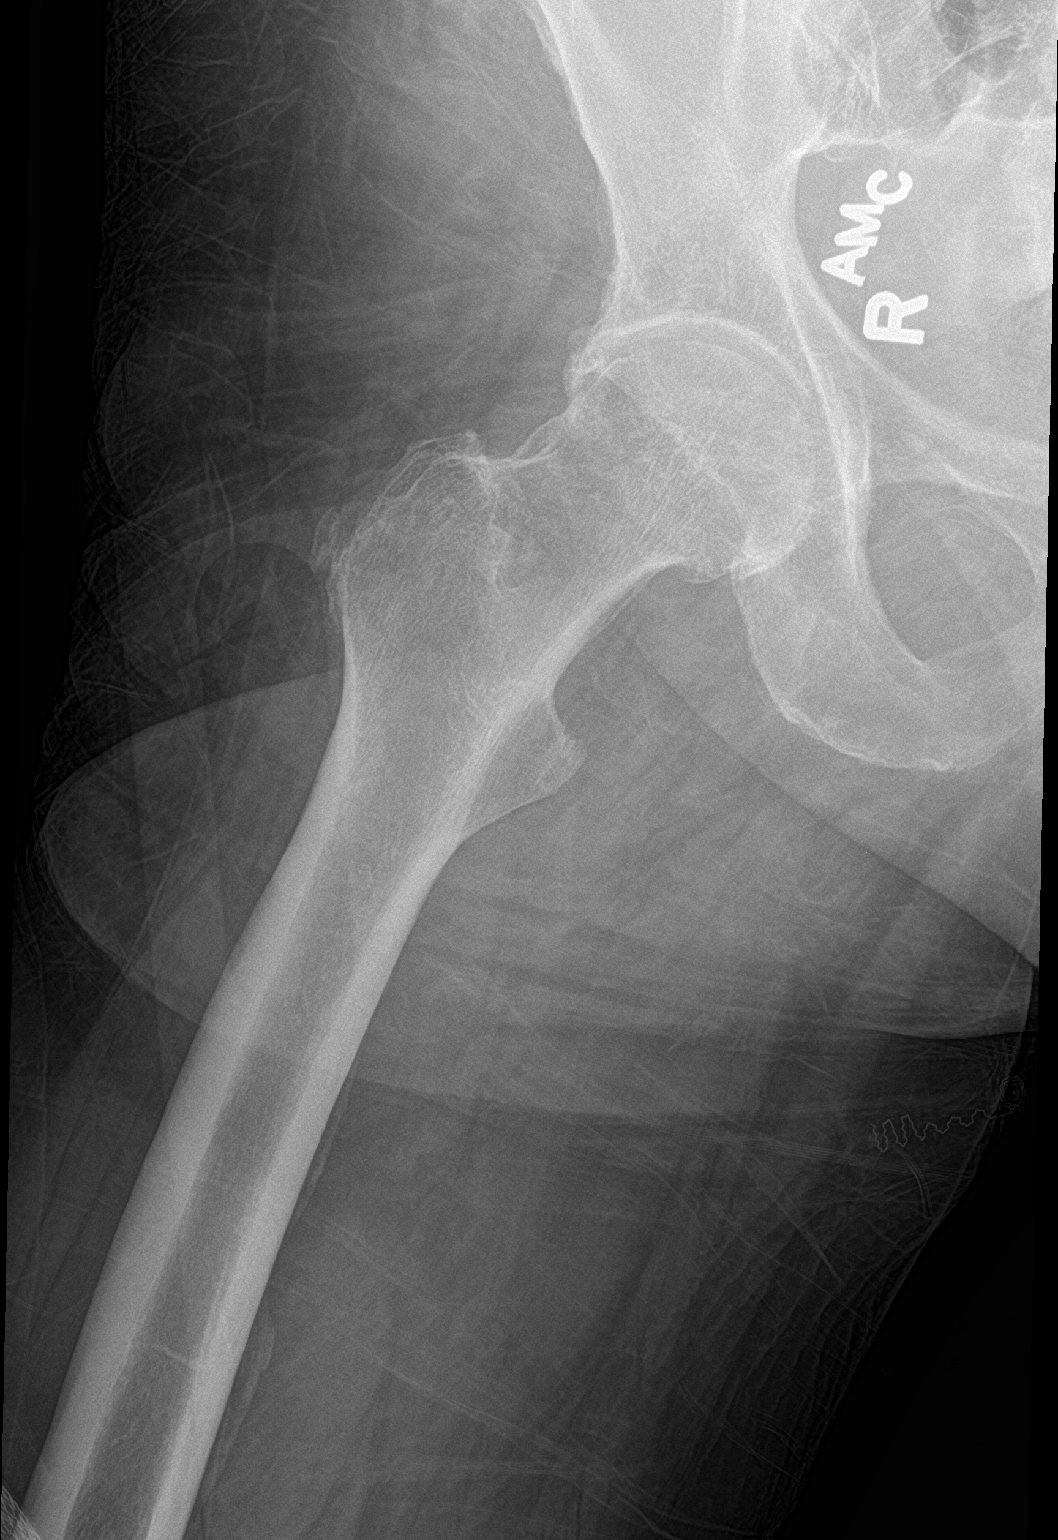

[2 of 2 positions shown; findings below may reference images not displayed]

FINDINGS: Markedly limited evaluation due to overlapping osseous structures
and overlying soft tissues. There is no evidence of hip fracture or
dislocation bilaterally. No acute displaced fracture or diastasis of
the bones of the pelvis. Visualized lower lumbar spine demonstrates
degenerative changes. Limited evaluation of the sacrum due to
overlying bowel. There is no evidence of severe arthropathy or other
focal bone abnormality.
IMPRESSION: Negative for acute traumatic injury.

## 2021-12-30 IMAGING — DX DG SCAPULA*R*
2 series · 2 of 2 positions shown · non-contrast
Comparison: None.

CLINICAL DATA: fall, back/hip pain

EXAM:
RIGHT SCAPULA - 2+ VIEWS

[scapula ap]
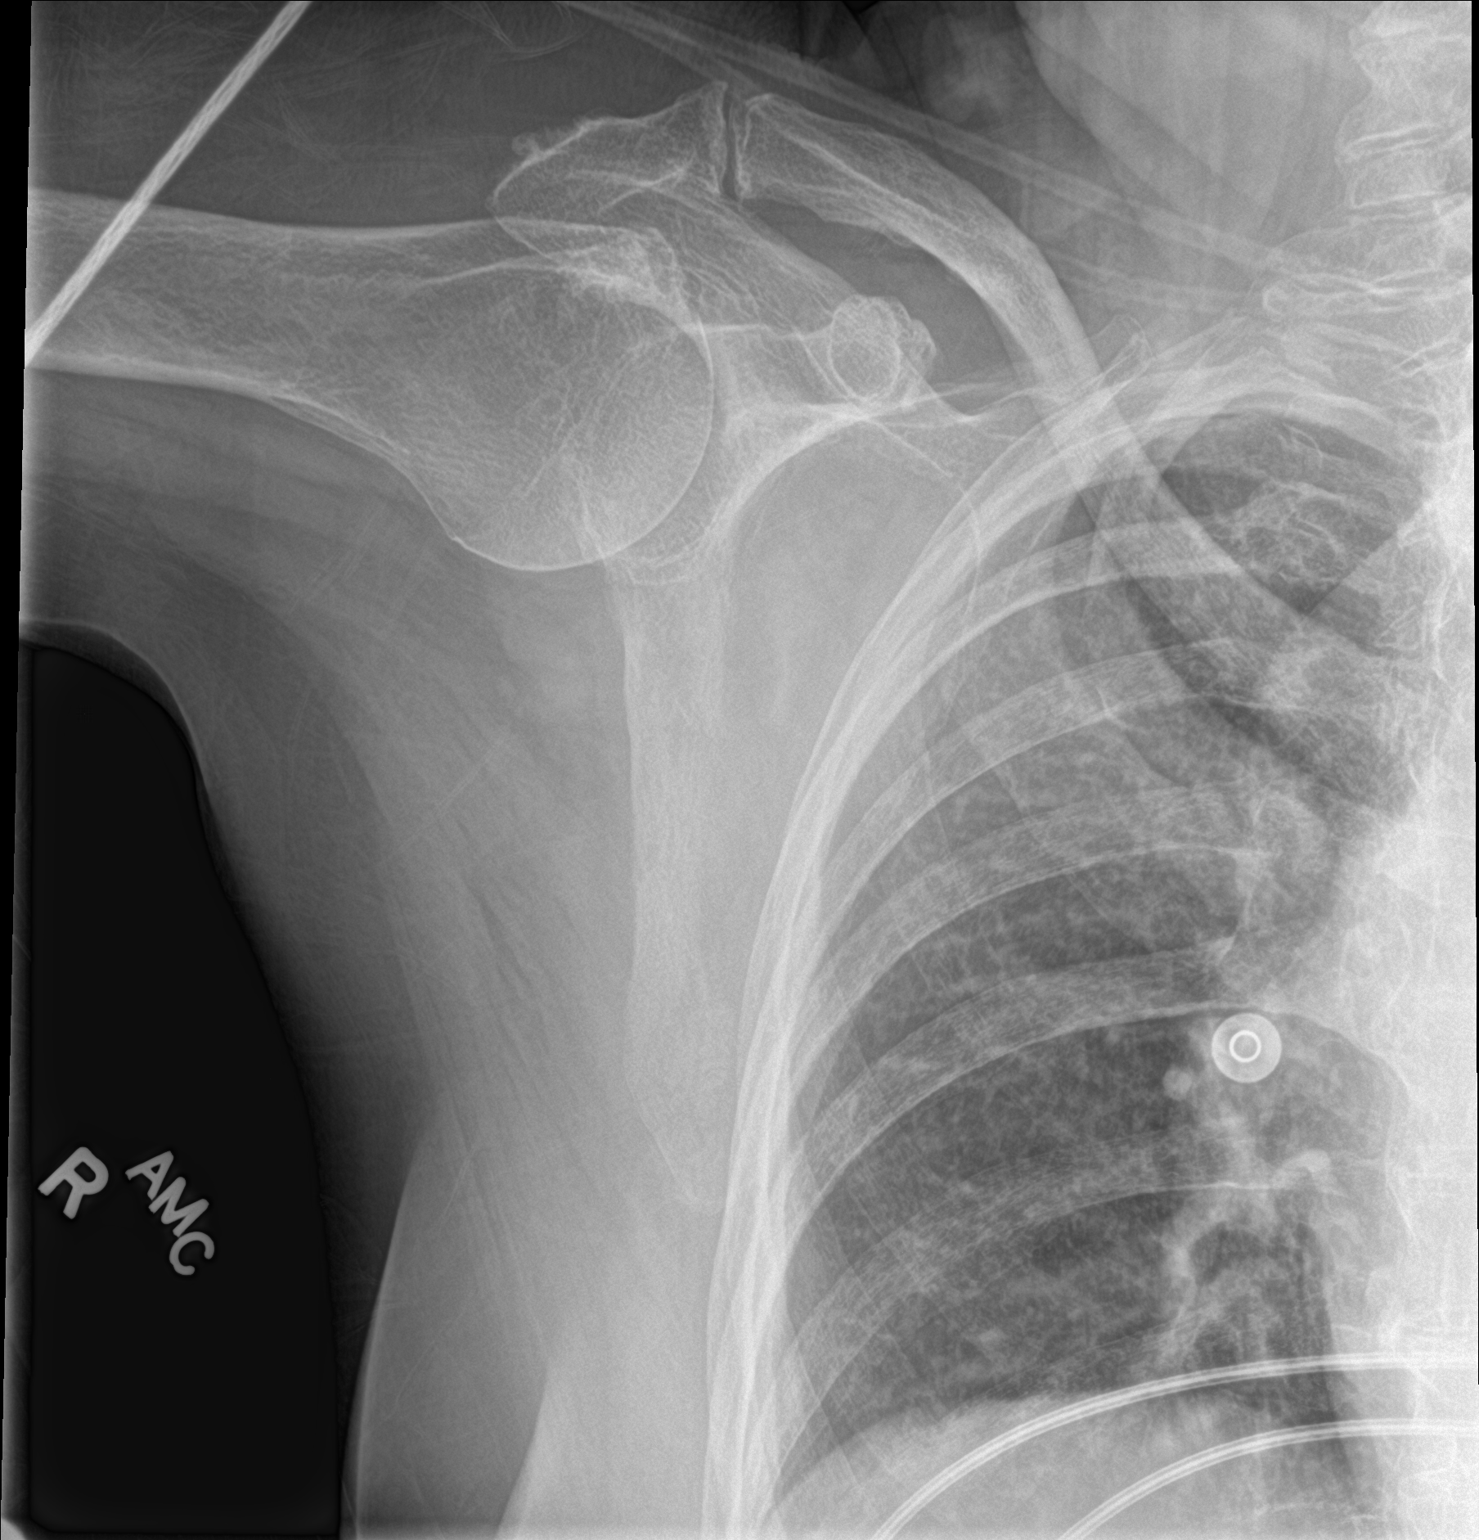

[scapula lat]
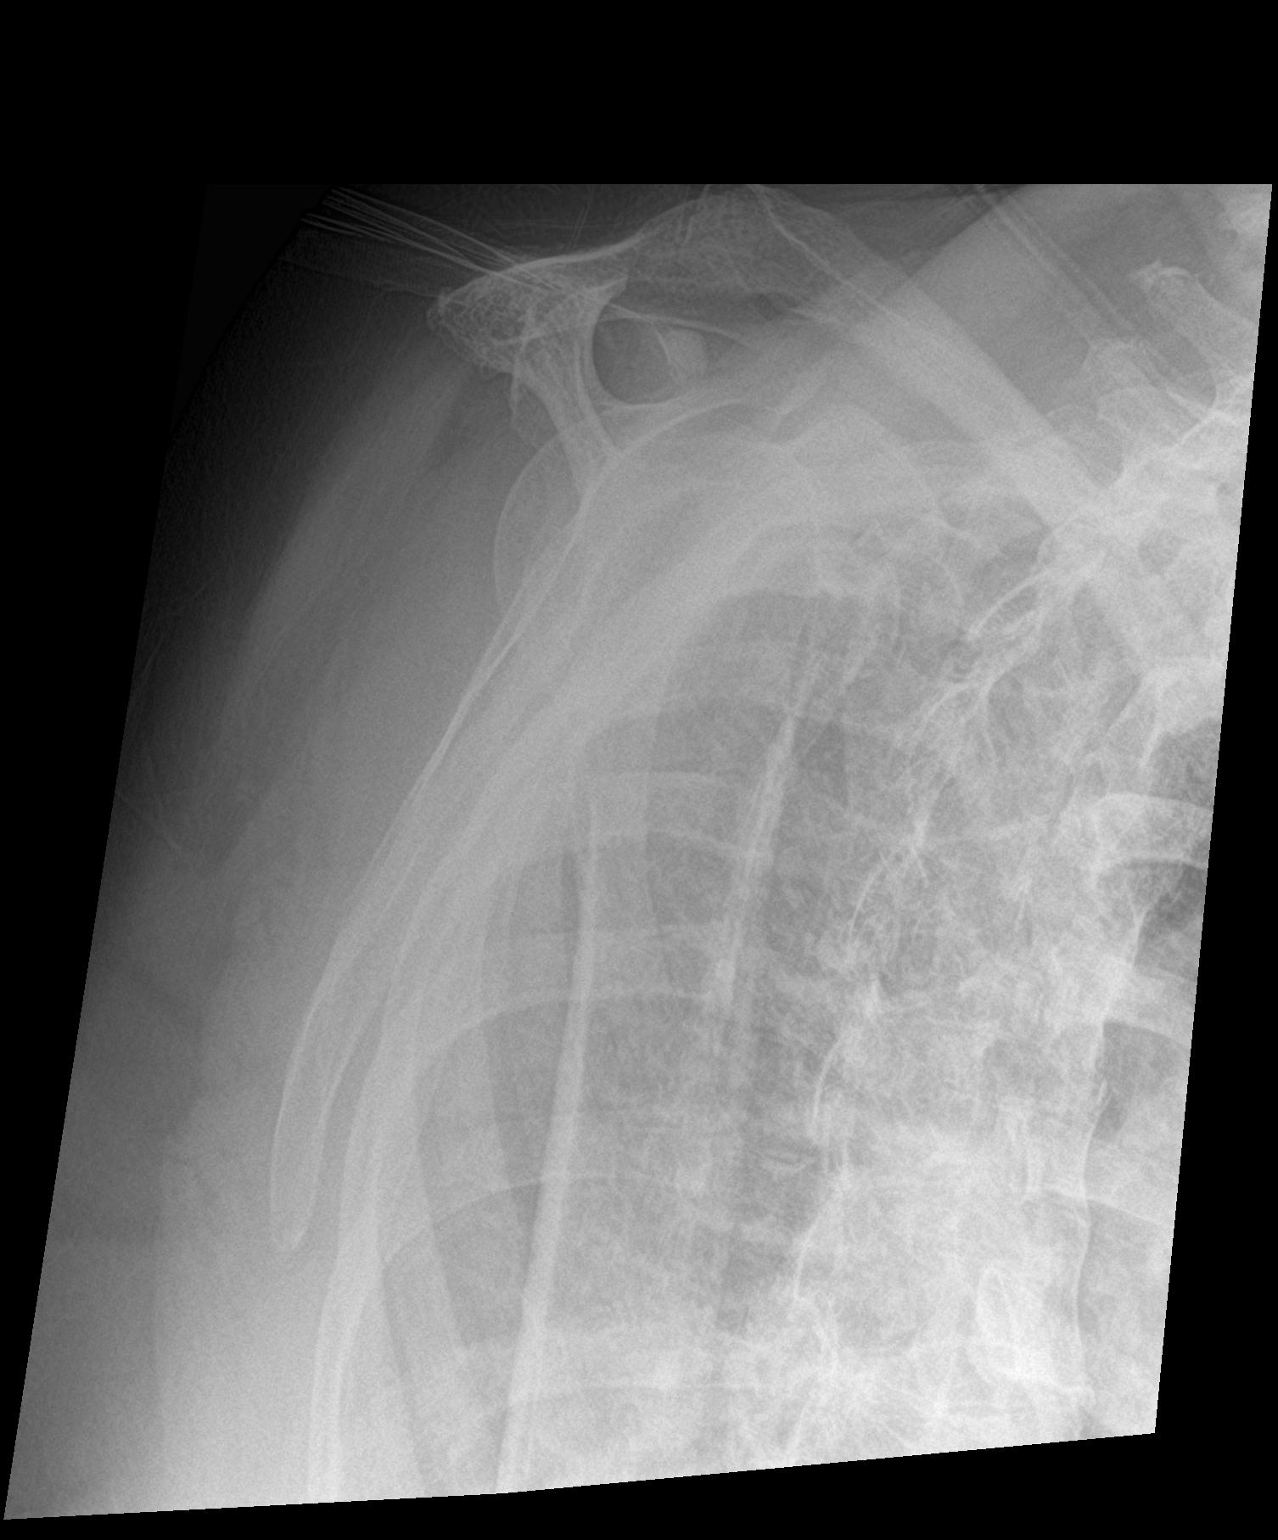

[2 of 2 positions shown; findings below may reference images not displayed]

FINDINGS: There is no evidence of fracture or other focal bone lesions.
Degenerative changes of the right acromioclavicular joint. Soft
tissues are unremarkable.
IMPRESSION: Negative.

## 2021-12-30 IMAGING — DX DG CHEST 2V
2 series · 2 of 2 positions shown · non-contrast
Comparison: Chest x-ray [DATE].

CLINICAL DATA: Fall with back pain.

EXAM:
CHEST - 2 VIEW

[chest ap]
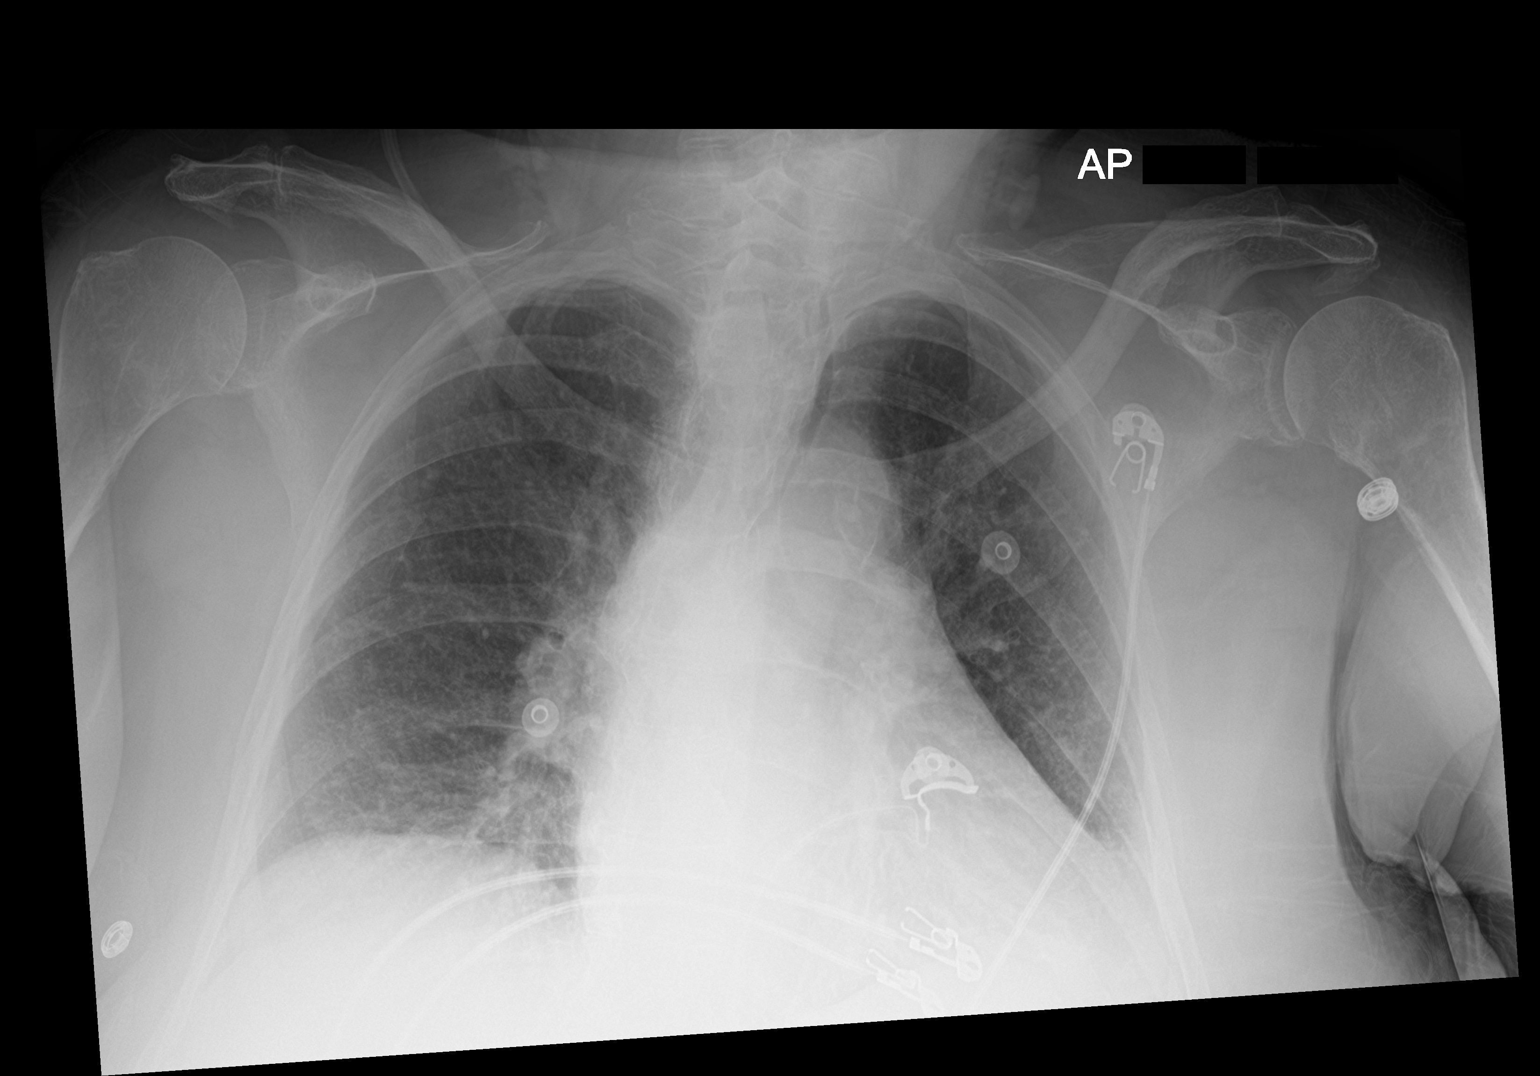

[chest lat]
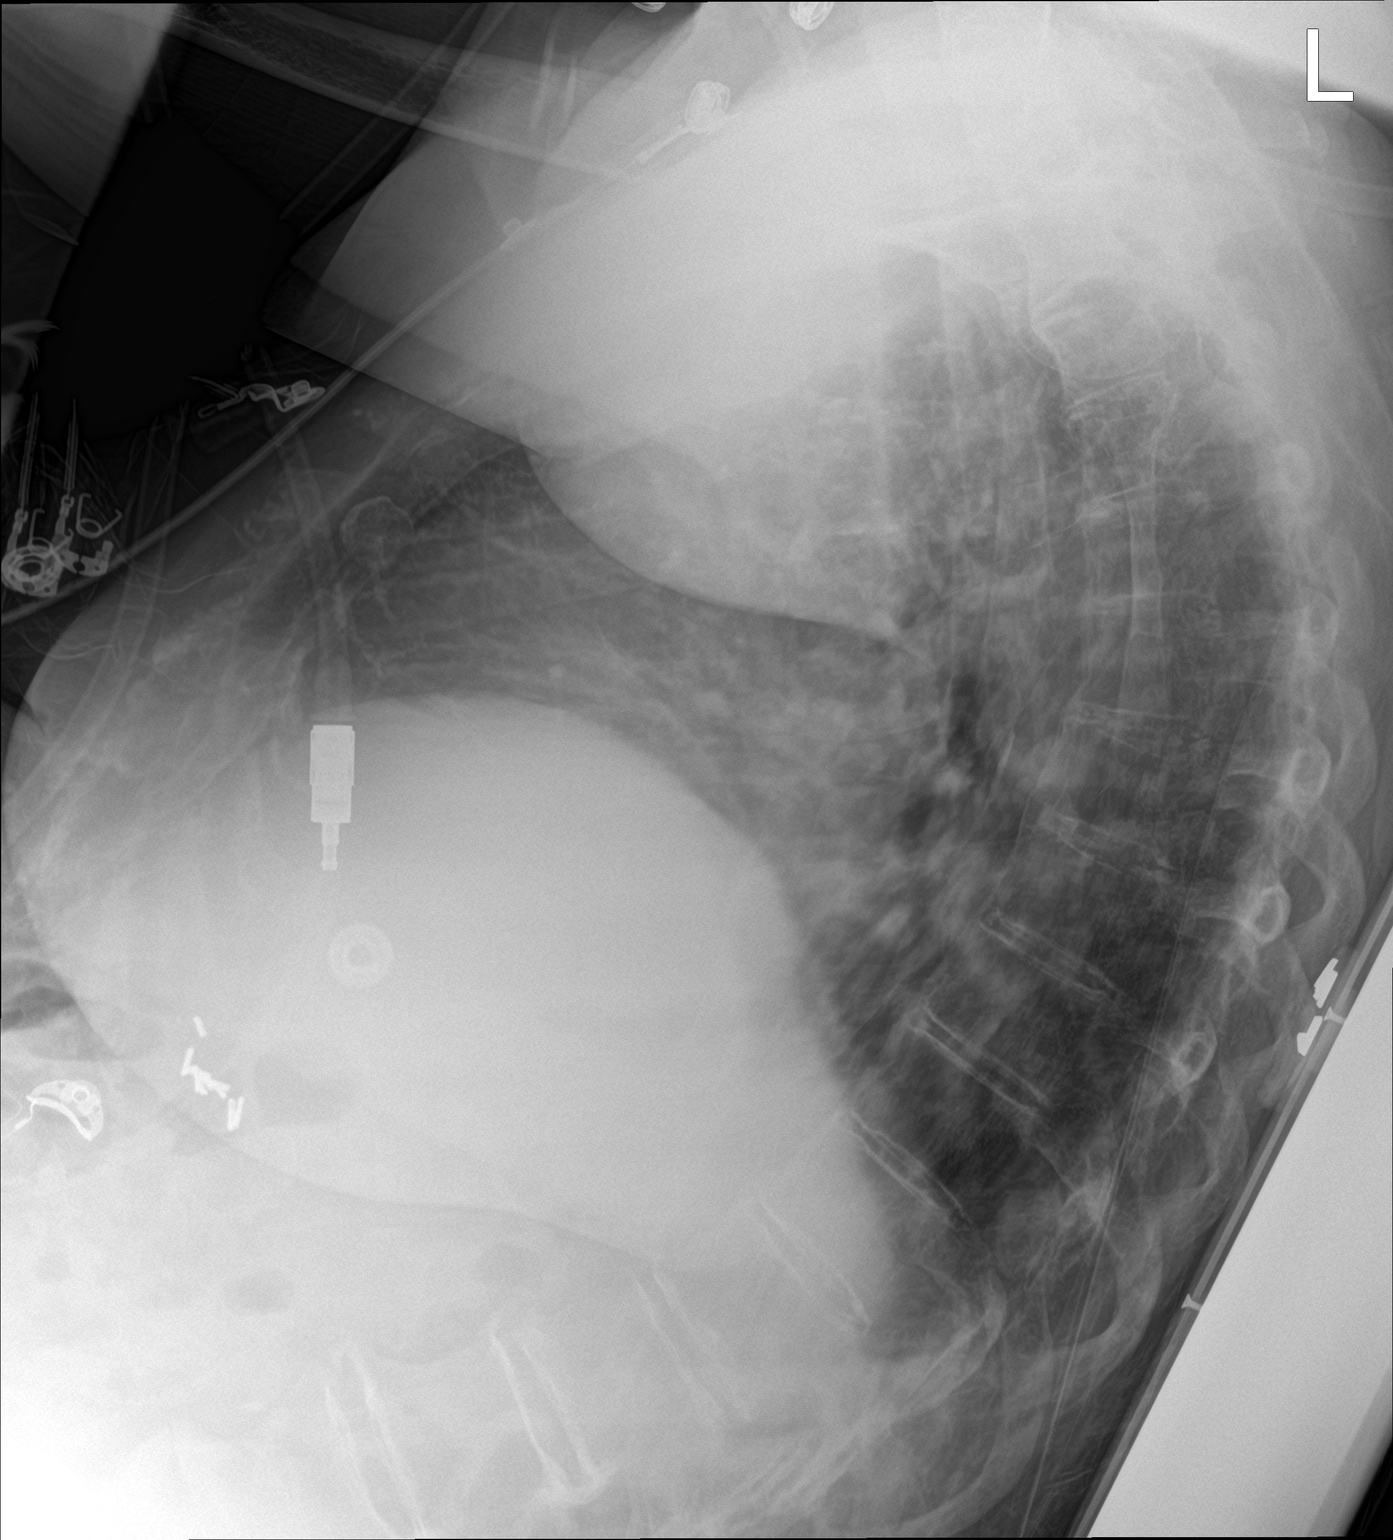

[2 of 2 positions shown; findings below may reference images not displayed]

FINDINGS: The heart size and mediastinal contours are within normal limits.
Both lungs are clear. The visualized skeletal structures are
unremarkable. There are surgical clips in the upper abdomen.
IMPRESSION: No active cardiopulmonary disease.

## 2021-12-30 MED ORDER — ONDANSETRON HCL 4 MG/2ML IJ SOLN
4.0000 mg | Freq: Once | INTRAMUSCULAR | Status: AC
  Administered 2021-12-30: 4 mg via INTRAVENOUS
  Filled 2021-12-30: qty 2

## 2021-12-30 MED ORDER — SODIUM CHLORIDE 0.9 % IV BOLUS
500.0000 mL | Freq: Once | INTRAVENOUS | Status: AC
  Administered 2021-12-30: 500 mL via INTRAVENOUS

## 2021-12-30 MED ORDER — FENTANYL CITRATE PF 50 MCG/ML IJ SOSY
50.0000 ug | PREFILLED_SYRINGE | Freq: Once | INTRAMUSCULAR | Status: AC
  Administered 2021-12-30: 50 ug via INTRAVENOUS
  Filled 2021-12-30: qty 1

## 2021-12-30 MED ORDER — LORAZEPAM 2 MG/ML IJ SOLN
1.0000 mg | Freq: Once | INTRAMUSCULAR | Status: AC | PRN
  Administered 2021-12-31: 1 mg via INTRAVENOUS
  Filled 2021-12-30: qty 1

## 2021-12-30 NOTE — ED Triage Notes (Signed)
Pt arrives via EMS from home with a fall around 1030 today. Pt fell into the shower when getting off the commode. Denies LOC or blood thinners. Pt had to crawl to get to a phone. Pt endorses left hip and sacral pain. Ccollar on. Shortening to left leg. 150 mcg fentanyl given.

## 2021-12-30 NOTE — ED Notes (Signed)
RN and NT ambulated pt in hallway. Pt had no difficulty w/ ambulation.

## 2021-12-30 NOTE — ED Notes (Signed)
Patient transported to CT 

## 2021-12-30 NOTE — ED Provider Notes (Signed)
11:05 PM Assumed care from Dr. Regenia Skeeter, please see their note for full history, physical and decision making until this point. In brief this is a 76 y.o. 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 Pending rest of MRI's and reeval w/ NSG discussion.   Patient with significant difficulty obtaining the MRI.  Second [place] to claustrophobia.  Tried Ativan which did not work.  Discussed the actual findings with neurosurgery and not able to determine the etiology of the abnormality based on the MRI as it is.  Needs a sedated MRI.  He also recommends against anticoagulation at this time till its clear exactly what this is and whether or not she will need a biopsy.  Will consult hospitalist.  Family and patient aware.   Labs, studies and imaging reviewed by myself and considered in medical decision making if ordered. Imaging interpreted by radiology.  Labs Reviewed  COMPREHENSIVE METABOLIC PANEL - Abnormal; Notable for the following components:      Result Value   Glucose, Bld 152 (*)    Calcium 8.6 (*)    Total Protein 6.3 (*)    All other components within normal limits  CBC WITH DIFFERENTIAL/PLATELET - Abnormal; Notable for the following components:   WBC 11.7 (*)    Neutro Abs 10.2 (*)    All other components within normal limits  CBG MONITORING, ED - Abnormal; Notable for the following components:   Glucose-Capillary 148 (*)    All other components within normal limits  RESP PANEL BY RT-PCR (FLU A&B, COVID) ARPGX2  CK  URINALYSIS, ROUTINE W REFLEX MICROSCOPIC  TROPONIN I (HIGH SENSITIVITY)  TROPONIN I (HIGH SENSITIVITY)  TROPONIN I (HIGH SENSITIVITY)    CT PELVIS WO CONTRAST  Final Result    CT Head Wo Contrast  Final Result    CT Cervical Spine Wo Contrast  Final Result    DG Chest 2 View  Final Result    DG Hip Unilat W or Wo Pelvis 2-3 Views Left  Final Result    DG Hip Unilat W or Wo Pelvis 2-3 Views Right  Final  Result    DG Scapula Right  Final Result    MR Cervical Spine W or Wo Contrast    (Results Pending)  MR BRAIN WO CONTRAST    (Results Pending)  MR BRAIN W CONTRAST    (Results Pending)    No follow-ups on file.    Lanea Vankirk, Corene Cornea, MD 12/31/21 [phone]

## 2021-12-30 NOTE — ED Notes (Signed)
Regenia Skeeter MD removed c-collar

## 2021-12-30 NOTE — ED Notes (Signed)
Patient transported to MRI 

## 2021-12-30 NOTE — ED Provider Notes (Signed)
[facility] EMERGENCY DEPARTMENT Provider Note   CSN: [id number] Arrival date & time: 12/30/21  1637     History  Chief Complaint  Patient presents with   Isabel Gibbs is a 76 y.o. female.  HPI 76 year old female presents with a chief complaint of fall and inability to get up.  She fell while trying to sit on the commode.  She is not sure exactly why she fell but thinks she might of just missed or gotten off balance.  She did not pass out and did not have any preceding symptoms.  She thinks she did hit her head though she did not hit it hard.  No neck pain.  She is primarily complaining of 8 out of 10 left hip/buttocks pain.  Is also having some pain in her right scapula and right lateral hip.  No chest pain.  However for the last couple weeks has been feeling generalized leg weakness.  She has generally not been feeling well.  No palpitations or chest pain.  Some trouble breathing and some dysuria for the last week.  No coughing.  She denies any prior history of A. fib.   Home Medications Prior to Admission medications   Medication Sig Start Date End Date Taking? Authorizing Provider  acetaminophen (TYLENOL) 500 MG tablet Take 500-1,000 mg by mouth 2 (two) times daily as needed for mild pain.   Yes [provider]  alendronate (FOSAMAX) 70 MG tablet Take 70 mg by mouth every Friday. 11/14/21  Yes [provider]  gabapentin (NEURONTIN) 300 MG capsule Take 900 mg by mouth See admin instructions. Take 900 mg by mouth in the evening and at bedtime   Yes [provider]  irbesartan (AVAPRO) 150 MG tablet Take 150 mg by mouth at bedtime.   Yes [provider]  SYSTANE ULTRA PF 0.4-0.3 % SOLN Place 1 drop into both eyes 3 (three) times daily as needed (for dryness).   Yes [provider]  traMADol (ULTRAM) 50 MG tablet Take 50 mg by mouth See admin instructions. Take 50 mg by mouth in the morning and evening   Yes [provider]  Vitamin D, Ergocalciferol, (DRISDOL) 1.25 MG (50000 UNIT) CAPS capsule Take 50,000 Units by mouth every Wednesday.   Yes [provider]  VOLTAREN 1 % GEL Apply 2 g topically 4 (four) times daily as needed (for bilateral knee pain).   Yes [provider]      Allergies    Codeine, Lisinopril, and Hydrocodone-acetaminophen    Review of Systems   Review of Systems  Constitutional:  Negative for fever.  Respiratory:  Positive for shortness of breath. Negative for cough.   Cardiovascular:  Negative for chest pain, palpitations and leg swelling.  Gastrointestinal:  Negative for abdominal pain.  Genitourinary:  Positive for dysuria.  Musculoskeletal:  Positive for arthralgias.  Neurological:  Positive for weakness. Negative for headaches.   Physical Exam Updated Vital Signs BP (!) 119/56    Pulse 76    Temp 99 F (37.2 C) (Oral)    Resp 16    Ht 5\' 7"  (1.702 m)    Wt 83.5 kg    SpO2 96%    BMI 28.82 kg/m  Physical Exam Vitals and nursing note reviewed.  Constitutional:      General: She is not in acute distress.    Appearance: She is well-developed. She is not ill-appearing or diaphoretic.     Interventions: Cervical  collar in place.  HENT:     Head: Normocephalic and atraumatic.     Right Ear: External ear normal.     Left Ear: External ear normal.     Nose: Nose normal.  Eyes:     General:        Right eye: No discharge.        Left eye: No discharge.  Cardiovascular:     Rate and Rhythm: Tachycardia present. Rhythm irregular.     Heart sounds: Normal heart sounds.  Pulmonary:     Effort: Pulmonary effort is normal.     Breath sounds: Rales (mild, bibasilar) present.  Abdominal:     General: There is no distension.     Palpations: Abdomen is soft.     Tenderness: There is no abdominal tenderness.  Musculoskeletal:     Cervical back: No tenderness or bony tenderness. No spinous process tenderness or muscular tenderness.     Thoracic back:  Tenderness present. No bony tenderness.     Lumbar back: No bony tenderness.       Back:     Right hip: Tenderness (lateral) present.     Left hip: Tenderness (lateral) present. Decreased range of motion.     Right upper leg: No tenderness.     Left upper leg: No tenderness.     Right knee: No tenderness.     Left knee: No tenderness.  Skin:    General: Skin is warm and dry.  Neurological:     Mental Status: She is alert.  Psychiatric:        Mood and Affect: Mood is not anxious.    ED Results / Procedures / Treatments   Labs (all labs ordered are listed, but only abnormal results are displayed) Labs Reviewed  COMPREHENSIVE METABOLIC PANEL - Abnormal; Notable for the following components:      Result Value   Glucose, Bld 152 (*)    Calcium 8.6 (*)    Total Protein 6.3 (*)    All other components within normal limits  CBC WITH DIFFERENTIAL/PLATELET - Abnormal; Notable for the following components:   WBC 11.7 (*)    Neutro Abs 10.2 (*)    All other components within normal limits  CBG MONITORING, ED - Abnormal; Notable for the following components:   Glucose-Capillary 148 (*)    All other components within normal limits  RESP PANEL BY RT-PCR (FLU A&B, COVID) ARPGX2  CK  URINALYSIS, ROUTINE W REFLEX MICROSCOPIC  TROPONIN I (HIGH SENSITIVITY)  TROPONIN I (HIGH SENSITIVITY)  TROPONIN I (HIGH SENSITIVITY)    EKG EKG Interpretation  Date/Time:  Monday December 30 2021 16:56:15 EST Ventricular Rate:  83 PR Interval:  154 QRS Duration: 78 QT Interval:  453 QTC Calculation: 533 R Axis:   19 Text Interpretation: Atrial fibrillation nonspecific T waves No old tracing to compare Confirmed by Sherwood Gambler [phone]) on 12/30/2021 5:37:45 PM  Radiology DG Chest 2 View  Result Date: 12/30/2021 CLINICAL DATA:  Fall with back pain. EXAM: CHEST - 2 VIEW COMPARISON:  Chest x-ray 02/23/2017. FINDINGS: The heart size and mediastinal contours are within normal limits. Both lungs are  clear. The visualized skeletal structures are unremarkable. There are surgical clips in the upper abdomen. IMPRESSION: No active cardiopulmonary disease. Electronically Signed   By: Ronney Asters M.D.   On: 12/30/2021 19:10   DG Scapula Right  Result Date: 12/30/2021 CLINICAL DATA:  fall, back/hip pain EXAM: RIGHT SCAPULA - 2+ VIEWS COMPARISON:  None.  FINDINGS: There is no evidence of fracture or other focal bone lesions. Degenerative changes of the right acromioclavicular joint. Soft tissues are unremarkable. IMPRESSION: Negative. Electronically Signed   By: Iven Finn M.D.   On: 12/30/2021 19:08   CT Head Wo Contrast  Result Date: 12/30/2021 CLINICAL DATA:  Status post fall EXAM: CT HEAD WITHOUT CONTRAST CT CERVICAL SPINE WITHOUT CONTRAST TECHNIQUE: Multidetector CT imaging of the head and cervical spine was performed following the standard protocol without intravenous contrast. Multiplanar CT image reconstructions of the cervical spine were also generated. COMPARISON:  MRI head 07/13/2018 FINDINGS: CT HEAD FINDINGS BRAIN: BRAIN Patchy and confluent areas of decreased attenuation are noted throughout the deep and periventricular white matter of the cerebral hemispheres bilaterally, compatible with chronic microvascular ischemic disease. No evidence of large-territorial acute infarction. No parenchymal hemorrhage. Suggestion of a 1.9 x 1.2 x 2cm left inferior frontal lobe lesion with surrounding vasogenic edema. No extra-axial collection. No mass effect or midline shift. No hydrocephalus. Basilar cisterns are patent. Vascular: No hyperdense vessel. Skull: No acute fracture or focal lesion. Sinuses/Orbits: Paranasal sinuses and mastoid air cells are clear. The orbits are unremarkable. Other: None. CT CERVICAL SPINE FINDINGS Alignment: Normal. Skull base and vertebrae: Pannus at the C1-C2 level with associated erosive changes of the skull base/anterior foramen magnum. Multilevel degenerative changes of the  spine. Lytic lesion along the base of the dens of unclear etiology. No acute fracture. No aggressive appearing focal osseous lesion or focal pathologic process. Soft tissues and spinal canal: No prevertebral fluid or swelling. No visible canal hematoma. Upper chest: Unremarkable. Other: None. IMPRESSION: 1. In 1.9 x 1.2 x 2 cm left inferior frontal lobe lesion with surrounding vasogenic edema. Recommend MRI brain with and without contrast for further evaluation. 2. No acute displaced fracture or traumatic listhesis of the cervical spine. 3. Lytic lesion along the base of the dens of unclear etiology. Electronically Signed   By: Iven Finn M.D.   On: 12/30/2021 20:22   CT Cervical Spine Wo Contrast  Result Date: 12/30/2021 CLINICAL DATA:  Status post fall EXAM: CT HEAD WITHOUT CONTRAST CT CERVICAL SPINE WITHOUT CONTRAST TECHNIQUE: Multidetector CT imaging of the head and cervical spine was performed following the standard protocol without intravenous contrast. Multiplanar CT image reconstructions of the cervical spine were also generated. COMPARISON:  MRI head 07/13/2018 FINDINGS: CT HEAD FINDINGS BRAIN: BRAIN Patchy and confluent areas of decreased attenuation are noted throughout the deep and periventricular white matter of the cerebral hemispheres bilaterally, compatible with chronic microvascular ischemic disease. No evidence of large-territorial acute infarction. No parenchymal hemorrhage. Suggestion of a 1.9 x 1.2 x 2cm left inferior frontal lobe lesion with surrounding vasogenic edema. No extra-axial collection. No mass effect or midline shift. No hydrocephalus. Basilar cisterns are patent. Vascular: No hyperdense vessel. Skull: No acute fracture or focal lesion. Sinuses/Orbits: Paranasal sinuses and mastoid air cells are clear. The orbits are unremarkable. Other: None. CT CERVICAL SPINE FINDINGS Alignment: Normal. Skull base and vertebrae: Pannus at the C1-C2 level with associated erosive changes of  the skull base/anterior foramen magnum. Multilevel degenerative changes of the spine. Lytic lesion along the base of the dens of unclear etiology. No acute fracture. No aggressive appearing focal osseous lesion or focal pathologic process. Soft tissues and spinal canal: No prevertebral fluid or swelling. No visible canal hematoma. Upper chest: Unremarkable. Other: None. IMPRESSION: 1. In 1.9 x 1.2 x 2 cm left inferior frontal lobe lesion with surrounding vasogenic edema. Recommend MRI brain with  and without contrast for further evaluation. 2. No acute displaced fracture or traumatic listhesis of the cervical spine. 3. Lytic lesion along the base of the dens of unclear etiology. Electronically Signed   By: Iven Finn M.D.   On: 12/30/2021 20:22   CT PELVIS WO CONTRAST  Result Date: 12/30/2021 CLINICAL DATA:  Hip trauma, fracture suspected, xray done 3 status post fall EXAM: CT PELVIS WITHOUT CONTRAST TECHNIQUE: Multidetector CT imaging of the pelvis was performed following the standard protocol without intravenous contrast. COMPARISON:  X-ray bilateral hips 12/30/2021 FINDINGS: Urinary Tract:  No abnormality visualized. Bowel: Scattered colonic diverticulosis. Otherwise unremarkable visualized pelvic bowel loops. Vascular/Lymphatic: Atherosclerotic plaque. No pathologically enlarged lymph nodes. No significant vascular abnormality seen. Reproductive:  No mass or other significant abnormality Other: No intraperitoneal free fluid. No intraperitoneal free gas. No organized fluid collection. Musculoskeletal: No large hematoma formation. Mild subcutaneus soft tissue edema along the left hip. No acute displaced fracture of the hips or pelvis. No pelvic bone diastasis. Degenerative changes of visualized lower lumbar spine. Lucency of the left acetabulum (6:50 2-56) likely represents a nutrient vessel. At least mild degenerative changes of bilateral hips. Old healed coccyx fracture with fusion to the S5 vertebral  body. IMPRESSION: 1. Negative for acute traumatic injury. 2.  Aortic Atherosclerosis (ICD10-I70.0). Electronically Signed   By: Iven Finn M.D.   On: 12/30/2021 21:43   DG Hip Unilat W or Wo Pelvis 2-3 Views Left  Result Date: 12/30/2021 CLINICAL DATA:  fall, back/hip pain EXAM: DG HIP (WITH OR WITHOUT PELVIS) 2-3V RIGHT; DG HIP (WITH OR WITHOUT PELVIS) 2-3V LEFT COMPARISON:  None. FINDINGS: Markedly limited evaluation due to overlapping osseous structures and overlying soft tissues. There is no evidence of hip fracture or dislocation bilaterally. No acute displaced fracture or diastasis of the bones of the pelvis. Visualized lower lumbar spine demonstrates degenerative changes. Limited evaluation of the sacrum due to overlying bowel. There is no evidence of severe arthropathy or other focal bone abnormality. IMPRESSION: Negative for acute traumatic injury. Electronically Signed   By: Iven Finn M.D.   On: 12/30/2021 19:09   DG Hip Unilat W or Wo Pelvis 2-3 Views Right  Result Date: 12/30/2021 CLINICAL DATA:  fall, back/hip pain EXAM: DG HIP (WITH OR WITHOUT PELVIS) 2-3V RIGHT; DG HIP (WITH OR WITHOUT PELVIS) 2-3V LEFT COMPARISON:  None. FINDINGS: Markedly limited evaluation due to overlapping osseous structures and overlying soft tissues. There is no evidence of hip fracture or dislocation bilaterally. No acute displaced fracture or diastasis of the bones of the pelvis. Visualized lower lumbar spine demonstrates degenerative changes. Limited evaluation of the sacrum due to overlying bowel. There is no evidence of severe arthropathy or other focal bone abnormality. IMPRESSION: Negative for acute traumatic injury. Electronically Signed   By: Iven Finn M.D.   On: 12/30/2021 19:09    Procedures Procedures    Medications Ordered in ED Medications  sodium chloride 0.9 % bolus 500 mL (has no administration in time range)  LORazepam (ATIVAN) injection 1 mg (has no administration in time  range)  sodium chloride 0.9 % bolus 500 mL (0 mLs Intravenous Stopped 12/30/21 1859)  ondansetron (ZOFRAN) injection 4 mg (4 mg Intravenous Given 12/30/21 1739)  fentaNYL (SUBLIMAZE) injection 50 mcg (50 mcg Intravenous Given 12/30/21 1739)    ED Course/ Medical Decision Making/ A&P  Medical Decision Making  Patient is complex.  From a generalized weakness standpoint causing her to fall today, I wonder if new onset and previously undiagnosed atrial fibrillation is the cause.  On cardiac monitoring she is in Afib on my view.  Her ECG also shows atrial fibrillation with some nonspecific T waves.  I do not have an old to compare this to.  I have reviewed her lab work and interpreted it, and there is no significant abnormality besides a mild leukocytosis and mild hyperglycemia.  I do not think there is an obvious cause for her generalized weakness and her lab work.  Her troponins are normal though it did slightly rise from the first of the second.  We will obtain a third.  From a injury perspective, the x-rays have been personally reviewed by myself and I do not see any obvious fractures and agree with radiology interpretations.  CT pelvis was obtained given her pain and there is no obvious fractures there either.  Her head was scanned via head CT given the fall and minor head injury.  I have reviewed these images as well and agree with radiology that there is a mass in the left frontal lobe.  Discussed this with patient and son at the bedside.  We will get MRI brain and based on the CT cervical spine findings of a lytic lesion we will also get MRI C-spine.  She tolerated the initial part of the brain MRI but then got a little anxious and urinated and so the MRI staff brought her back.  Unfortunately she will need further imaging and will be sent back.  Imaging results are pending.  She was able to get up and walk on her own without assistance.  I think it will depend on family  comfort with going home as well as MRI results as to whether she needs to be admitted.  Care to Dr. Dayna Barker with MRI and final disposition pending.  Of note, based on her history of hypertension, her age, and her female sex Chad2DS2-VASc score of 4.         Final Clinical Impression(s) / ED Diagnoses Final diagnoses:  None    Rx / DC Orders ED Discharge Orders     None         Sherwood Gambler, MD 12/30/21 2322

## 2021-12-31 ENCOUNTER — Emergency Department (HOSPITAL_COMMUNITY): Payer: Medicare Other

## 2021-12-31 ENCOUNTER — Encounter (HOSPITAL_COMMUNITY): Payer: Self-pay | Admitting: Emergency Medicine

## 2021-12-31 ENCOUNTER — Other Ambulatory Visit: Payer: Self-pay

## 2021-12-31 DIAGNOSIS — G939 Disorder of brain, unspecified: Secondary | ICD-10-CM | POA: Diagnosis not present

## 2021-12-31 DIAGNOSIS — I4891 Unspecified atrial fibrillation: Secondary | ICD-10-CM | POA: Diagnosis not present

## 2021-12-31 DIAGNOSIS — I10 Essential (primary) hypertension: Secondary | ICD-10-CM | POA: Diagnosis present

## 2021-12-31 DIAGNOSIS — R9431 Abnormal electrocardiogram [ECG] [EKG]: Secondary | ICD-10-CM | POA: Diagnosis not present

## 2021-12-31 DIAGNOSIS — I471 Supraventricular tachycardia: Secondary | ICD-10-CM | POA: Diagnosis present

## 2021-12-31 DIAGNOSIS — R7303 Prediabetes: Secondary | ICD-10-CM | POA: Insufficient documentation

## 2021-12-31 LAB — CBC
HCT: 35.4 % — ABNORMAL LOW (ref 36.0–46.0)
Hemoglobin: 11.2 g/dL — ABNORMAL LOW (ref 12.0–15.0)
MCH: 28.2 pg (ref 26.0–34.0)
MCHC: 31.6 g/dL (ref 30.0–36.0)
MCV: 89.2 fL (ref 80.0–100.0)
Platelets: 217 10*3/uL (ref 150–400)
RBC: 3.97 MIL/uL (ref 3.87–5.11)
RDW: 12.1 % (ref 11.5–15.5)
WBC: 7.1 10*3/uL (ref 4.0–10.5)
nRBC: 0 % (ref 0.0–0.2)

## 2021-12-31 LAB — BASIC METABOLIC PANEL
Anion gap: 6 (ref 5–15)
BUN: 11 mg/dL (ref 8–23)
CO2: 23 mmol/L (ref 22–32)
Calcium: 7.1 mg/dL — ABNORMAL LOW (ref 8.9–10.3)
Chloride: 108 mmol/L (ref 98–111)
Creatinine, Ser: 0.68 mg/dL (ref 0.44–1.00)
GFR, Estimated: >60 mL/min (ref >60)
Glucose, Bld: 104 mg/dL — ABNORMAL HIGH (ref 70–99)
Potassium: 3.9 mmol/L (ref 3.5–5.1)
Sodium: 137 mmol/L (ref 135–145)

## 2021-12-31 LAB — TROPONIN I (HIGH SENSITIVITY): Troponin I (High Sensitivity): 17 ng/L (ref <18)

## 2021-12-31 LAB — MAGNESIUM: Magnesium: 1.6 mg/dL — ABNORMAL LOW (ref 1.7–2.4)

## 2021-12-31 LAB — TSH: TSH: 0.871 u[IU]/mL (ref 0.350–4.500)

## 2021-12-31 IMAGING — MR MR CERVICAL SPINE W/O CM
5 of 7 series · 30 of 48 positions shown · non-contrast
Comparison: Prior CT from [DATE].

CLINICAL DATA: Initial evaluation for possible bone lesion.

EXAM:
MRI CERVICAL SPINE WITHOUT CONTRAST
TECHNIQUE: Multiplanar, multisequence MR imaging of the cervical spine was
performed. No intravenous contrast was administered.

[Series 10: T2 · sagittal · 3.0mm · 0.69mm/px · 3 of 17 slices shown (1 of 2)]
[im 1/17]
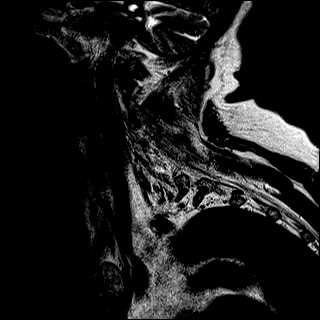
[im 9/17]
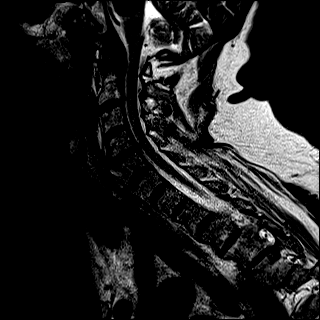
[im 17/17]
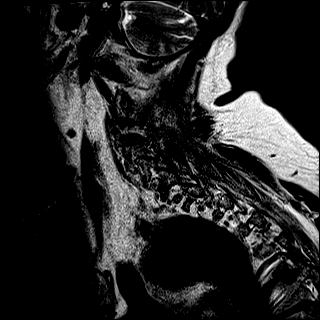

[Series 11: T1 · sagittal · 3.0mm · 0.69mm/px · 4 of 17 slices shown (1 of 2)]
[im 1/17]
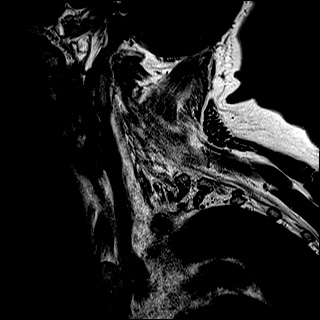
[im 6/17]
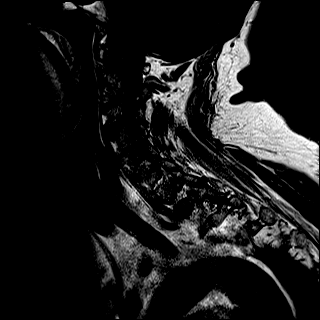
[im 11/17]
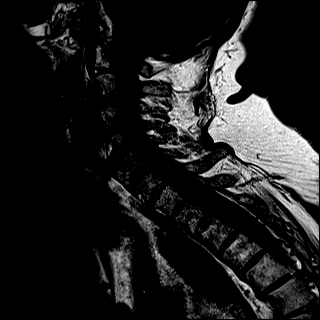
[im 17/17]
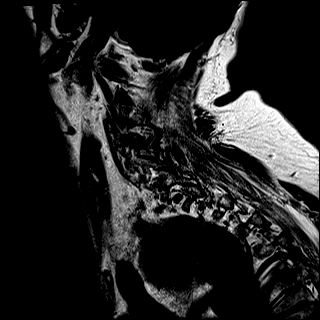

[Series 12: STIR · sagittal · 3.0mm · 0.86mm/px · 1 of 17 slices shown]
[im 1/17]
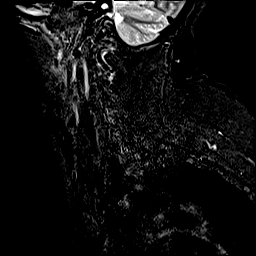

[Series 14: T2 · axial · 3.0mm · 0.78mm/px · z∈[-24,+124]mm · 11 of 47 slices shown (2 of 2)]
[im 1/47]
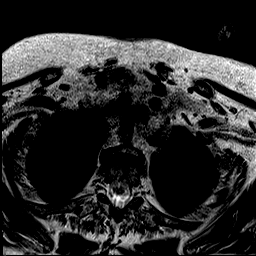
[im 5/47]
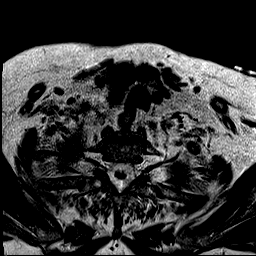
[im 10/47]
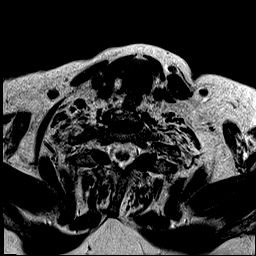
[im 14/47]
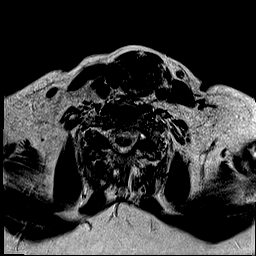
[im 19/47]
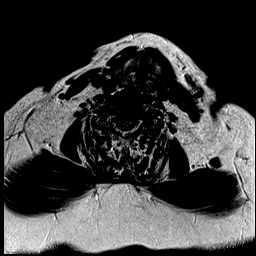
[im 24/47]
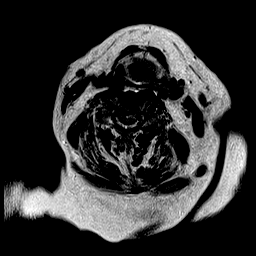
[im 28/47]
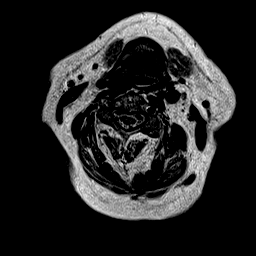
[im 33/47]
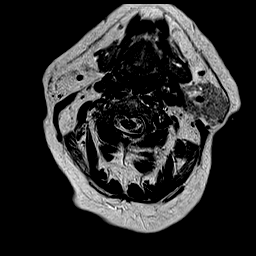
[im 37/47]
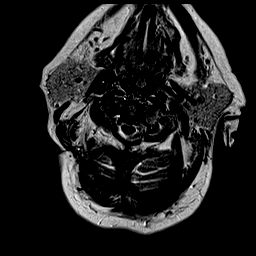
[im 42/47]
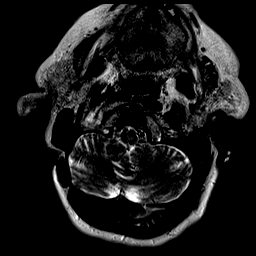
[im 47/47]
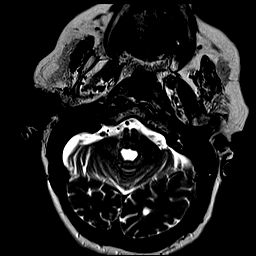

[Series 15: T1 · axial · 3.0mm · 0.39mm/px · z∈[-25,+124]mm · 11 of 47 slices shown (2 of 2)]
[im 1/47]
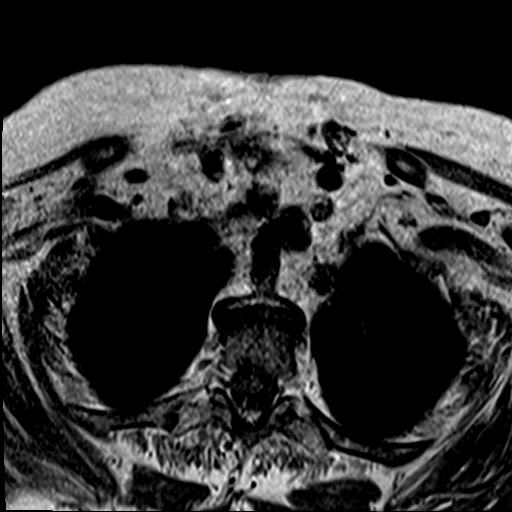
[im 5/47]
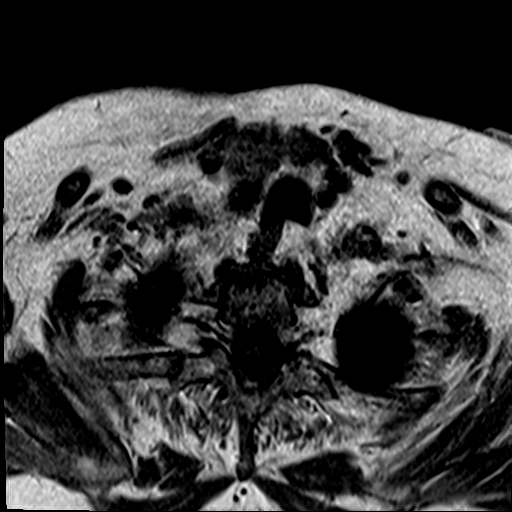
[im 10/47]
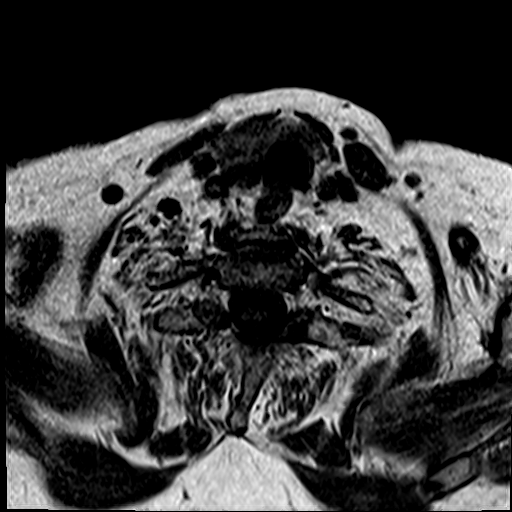
[im 14/47]
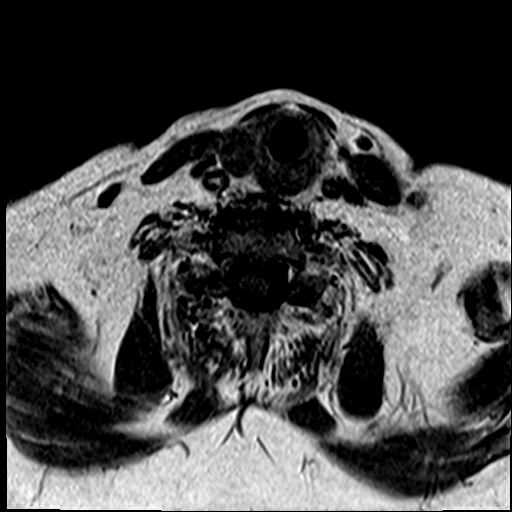
[im 19/47]
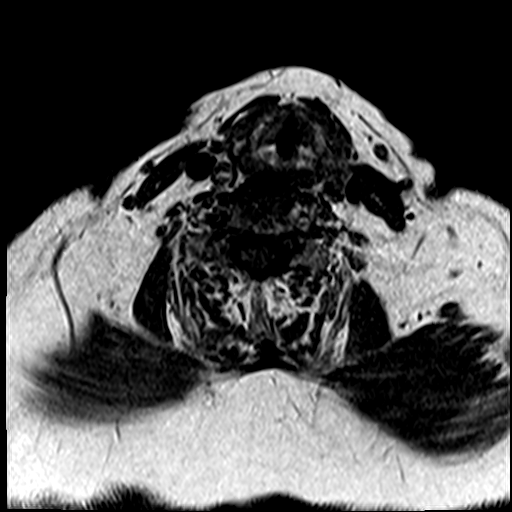
[im 24/47]
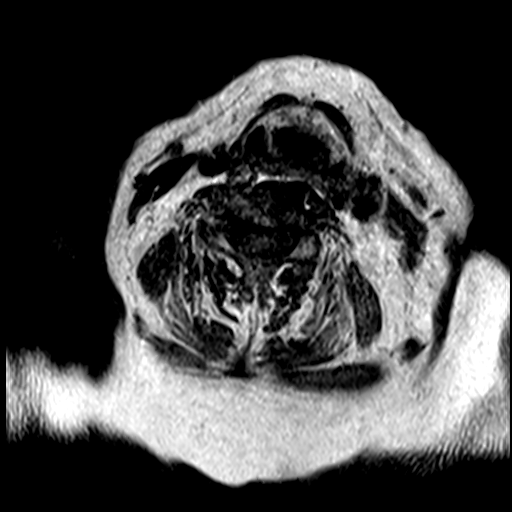
[im 28/47]
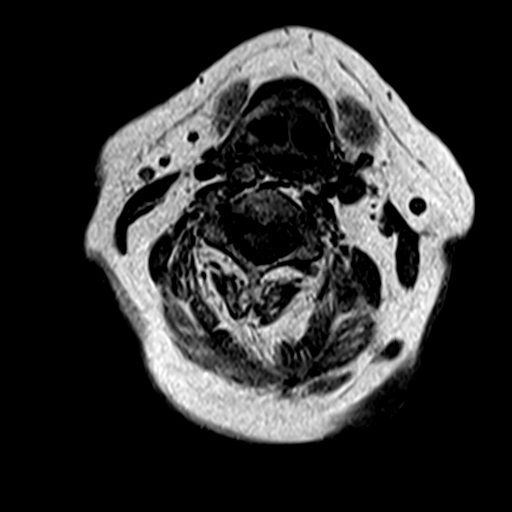
[im 33/47]
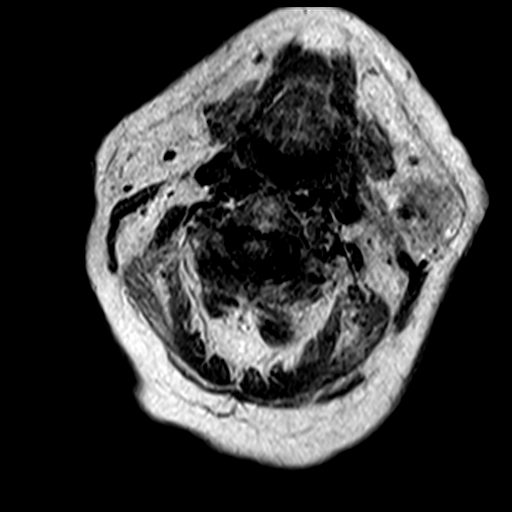
[im 37/47]
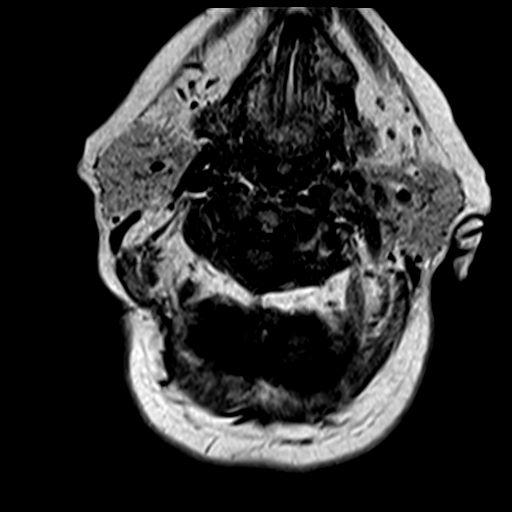
[im 42/47]
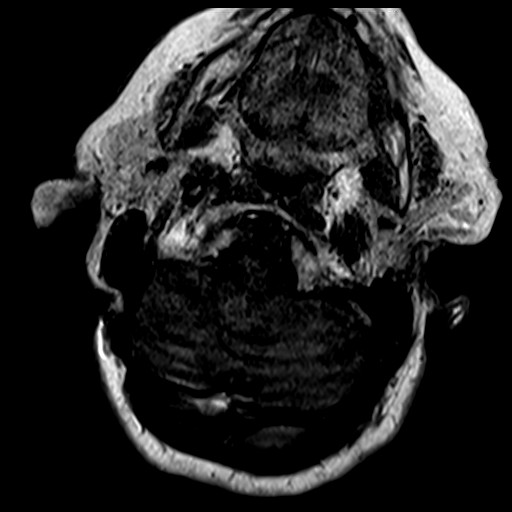
[im 47/47]
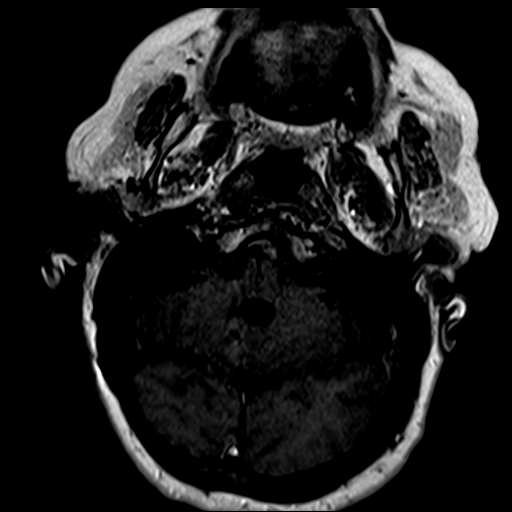

[30 of 48 positions shown; findings below may reference images not displayed]

FINDINGS: Alignment: Examination degraded by motion artifact. Additionally,
patient was unable to tolerate the full length of the exam. No
postcontrast images were performed.

Vertebral bodies normally aligned with preservation of the normal
cervical lordosis. No listhesis.

Vertebrae: Vertebral body height maintained without acute or chronic
fracture. Bone marrow signal intensity within normal limits. 6 mm
T1/T2 hyperintense lesion seen at the base of the dens, consistent
with a small benign hemangioma. This corresponds with lesion noted
on prior CT. No other worrisome osseous lesions seen elsewhere
within the cervical spine. No abnormal marrow edema.

Cord: Normal signal and morphology.

Posterior Fossa, vertebral arteries, paraspinal tissues: Visualized
brain and posterior fossa within normal limits. Degenerative
thickening about the tectorial membrane with pannus formation noted.
No significant stenosis at the craniocervical junction. Paraspinous
and prevertebral soft tissues within normal limits. Normal flow
voids seen within the vertebral arteries bilaterally. Few small
thyroid nodules noted, largest of which measures 12 mm on the right,
of doubtful significance given size and patient age, no follow-up
imaging recommended (ref: [HOSPITAL]. [DATE]): 143-50).

Disc levels:

C2-C3: Shallow left eccentric disc osteophyte complex mildly
flattens the ventral thecal sac. Minimal flattening of the left
ventral cord without cord signal changes. Mild ligamentum flavum
hypertrophy. Resultant mild spinal stenosis. Foramina remain patent.

C3-C4: Small central disc protrusion indents the ventral thecal sac
(series 13, image 22). No more than mild spinal stenosis without
frank cord impingement. Left-sided uncovertebral and facet
hypertrophy with resultant mild to moderate left C4 foraminal
stenosis. Right neural foramen remains patent.

C4-C5: Right paracentral disc protrusion indents the right ventral
thecal sac (series 13, image 27). Minimal flattening of the ventral
cord without cord signal changes. Mild spinal stenosis. Superimposed
uncovertebral and facet hypertrophy with resultant moderate left C5
foraminal narrowing. Right neural foramina remains patent.

C5-C6: Small central disc protrusion indents the ventral thecal sac.
No significant spinal stenosis or cord deformity. Superimposed mild
facet hypertrophy. Foramina remain patent.

C6-C7: Anterior endplate spurring without significant disc bulge.
Mild facet hypertrophy. No stenosis.

C7-T1:  Negative interspace.  Mild facet hypertrophy.  No stenosis.

Visualized upper thoracic spine demonstrates no significant finding.
IMPRESSION: 1. 6 mm benign hemangioma at the base of the dens corresponding with
abnormality noted on prior CT. No other worrisome osseous lesions
within the cervical spine.
2. Multilevel cervical spondylosis with resultant mild spinal
stenosis at C2-3 through C4-5.
3. Multifactorial degenerative changes with resultant mild to
moderate left C4 and C5 foraminal stenosis.

## 2021-12-31 IMAGING — MR MRI BRAIN WO CONTRAST
4 of 6 series · 30 of 48 positions shown · non-contrast
Comparison: Prior head CT from [DATE].

CLINICAL DATA: Initial evaluation for acute delirium, altered
mental status.

EXAM:
MRI HEAD WITHOUT CONTRAST
TECHNIQUE: Multiplanar, multiecho pulse sequences of the brain and surrounding
structures were obtained without intravenous contrast.

[Series 17: mag_images · axial · 3.0mm · 0.94mm/px · z∈[+130,+293]mm · 8 of 56 slices shown]
[im 1/56]
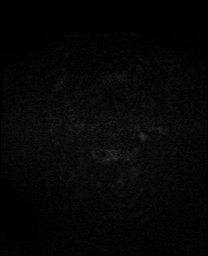
[im 8/56]
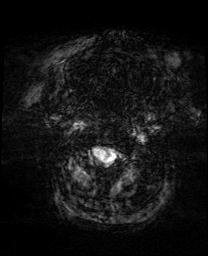
[im 16/56]
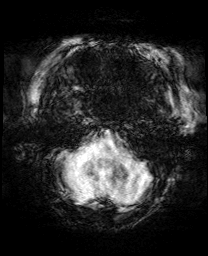
[im 24/56]
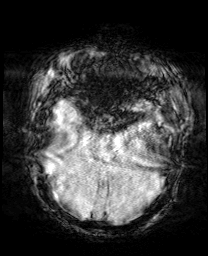
[im 32/56]
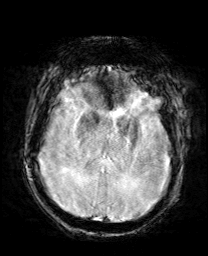
[im 40/56]
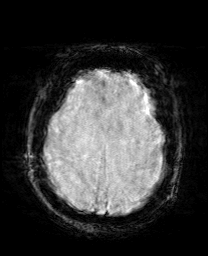
[im 48/56]
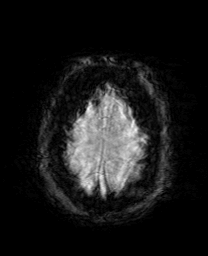
[im 56/56]
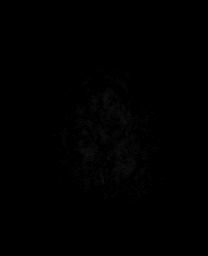

[Series 18: pha_images · axial · 3.0mm · 0.94mm/px · z∈[+145,+290]mm · 7 of 50 slices shown]
[im 1/50]
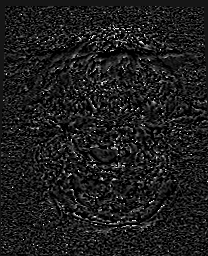
[im 9/50]
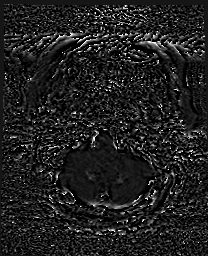
[im 17/50]
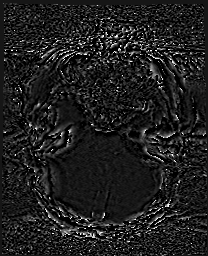
[im 25/50]
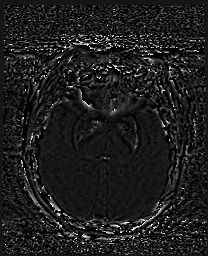
[im 33/50]
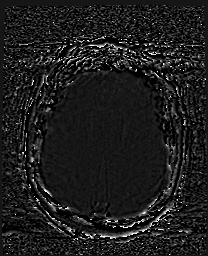
[im 41/50]
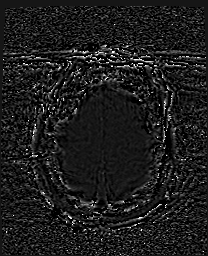
[im 50/50]
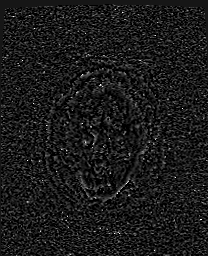

[Series 19: swi_images · axial · 3.0mm · 0.94mm/px · z∈[+130,+293]mm · 8 of 56 slices shown]
[im 1/56]
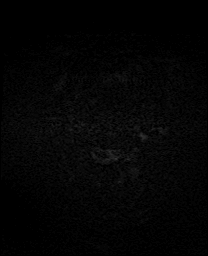
[im 8/56]
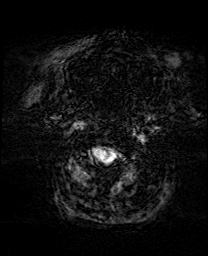
[im 16/56]
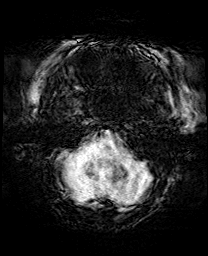
[im 24/56]
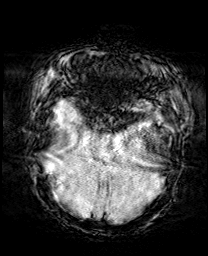
[im 32/56]
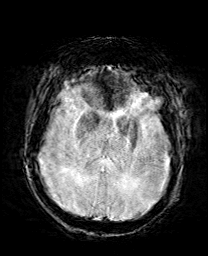
[im 40/56]
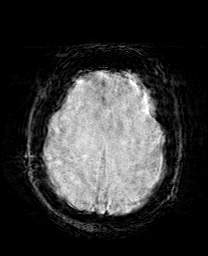
[im 48/56]
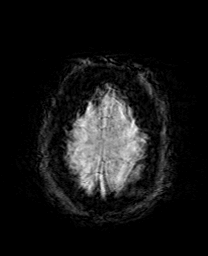
[im 56/56]
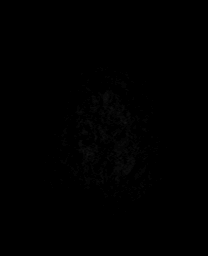

[Series 20: mip_images(sw) · axial · 24.0mm · 0.94mm/px · z∈[+140,+282]mm · 7 of 49 slices shown]
[im 1/49]
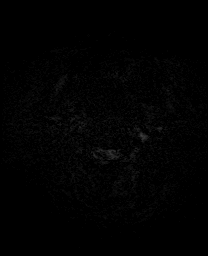
[im 9/49]
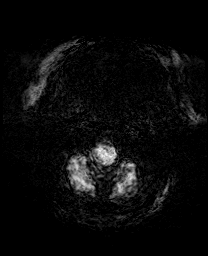
[im 17/49]
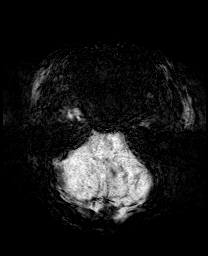
[im 25/49]
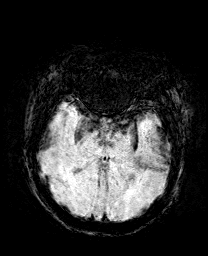
[im 33/49]
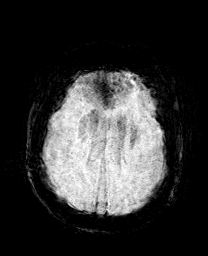
[im 41/49]
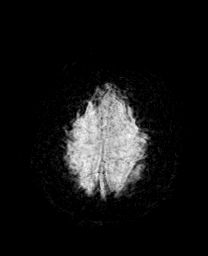
[im 49/49]
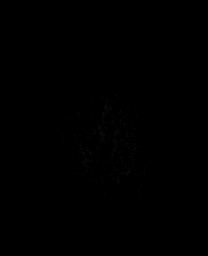

[30 of 48 positions shown; findings below may reference images not displayed]

FINDINGS: Brain: Examination mildly degraded by motion.

Diffuse prominence of the CSF containing spaces compatible
generalized cerebral atrophy. Minimal T2/FLAIR hyperintensity noted
involving the periventricular white matter and pons, most
characteristic of chronic microvascular ischemic disease, mild for
age.

Subtle 9 mm focus of diffusion abnormality seen at level vein the
left periatrial white matter (series 9, image 85). Associated signal
loss on corresponding ADC map (series 10, image 29). Finding
consistent with a small acute ischemic infarct. Probable additional
subcentimeter cortical infarct noted at the left occipital pole
posteriorly (series 9, image 72). No associated hemorrhage or mass
effect about these infarcts.

Gray-white matter differentiation otherwise maintained. No other
evidence for chronic cortical infarction. No acute intracranial
hemorrhage.

Subtle 5 mm T2 hypointense lesion seen involving the periventricular
white matter adjacent to the frontal horn of the right lateral
ventricle (series 19, image 20). This lesion blooms on
susceptibility sequence, and corresponds with hyperdensity on prior
CT. Appearance is most characteristic of a small cavernoma.

No other mass lesion, mass effect or midline shift. No hydrocephalus
or extra-axial fluid collection. Partially empty sella noted.
Midline structures intact.

Vascular: Major intracranial vascular flow voids are grossly
maintained at the skull base.

Skull and upper cervical spine: Craniocervical junction within
normal limits. Diffusely decreased T1 weighted signal seen
throughout the calvarium, nonspecific, but most commonly related to
anemia, smoking or obesity. No concerning focal marrow replacing
lesion. No scalp soft tissue abnormality.

Sinuses/Orbits: Globes and orbital soft tissues demonstrate no acute
finding. Paranasal sinuses are largely clear. No mastoid effusion.

Other: None.
IMPRESSION: 1. Two small subcentimeter acute ischemic nonhemorrhagic infarcts
involving the left periatrial white matter and left occipital pole
as above.
2. 5 mm T2 hypointense lesion involving the periventricular white
matter adjacent to the frontal horn of the right lateral ventricle,
most characteristic of a small cavernoma. Finding corresponds with
hyperdensity noted on prior CT.
3. Underlying mild chronic microvascular ischemic disease.

## 2021-12-31 MED ORDER — LIDOCAINE 5 % EX PTCH
1.0000 | MEDICATED_PATCH | CUTANEOUS | Status: DC
  Administered 2021-12-31 – 2022-01-02 (×3): 1 via TRANSDERMAL
  Filled 2021-12-31 (×3): qty 1

## 2021-12-31 MED ORDER — GABAPENTIN 300 MG PO CAPS
900.0000 mg | ORAL_CAPSULE | Freq: Two times a day (BID) | ORAL | Status: DC
  Administered 2021-12-31 – 2022-01-01 (×3): 900 mg via ORAL
  Filled 2021-12-31 (×3): qty 3

## 2021-12-31 MED ORDER — SODIUM CHLORIDE 0.9% FLUSH
3.0000 mL | Freq: Two times a day (BID) | INTRAVENOUS | Status: DC
  Administered 2022-01-01 – 2022-01-02 (×3): 3 mL via INTRAVENOUS

## 2021-12-31 MED ORDER — TRAMADOL HCL 50 MG PO TABS
50.0000 mg | ORAL_TABLET | Freq: Two times a day (BID) | ORAL | Status: DC
  Administered 2021-12-31 – 2022-01-03 (×3): 50 mg via ORAL
  Filled 2021-12-31 (×3): qty 1

## 2021-12-31 MED ORDER — IRBESARTAN 150 MG PO TABS
150.0000 mg | ORAL_TABLET | Freq: Every day | ORAL | Status: DC
  Administered 2021-12-31 – 2022-01-01 (×2): 150 mg via ORAL
  Filled 2021-12-31 (×3): qty 1

## 2021-12-31 MED ORDER — DICLOFENAC SODIUM 1 % EX GEL: 2.0000 g | Freq: Four times a day (QID) | CUTANEOUS | Status: DC | PRN

## 2021-12-31 MED ORDER — SENNOSIDES-DOCUSATE SODIUM 8.6-50 MG PO TABS: 1.0000 | ORAL_TABLET | Freq: Every evening | ORAL | Status: DC | PRN

## 2021-12-31 MED ORDER — POLYVINYL ALCOHOL 1.4 % OP SOLN: 1.0000 [drp] | Freq: Three times a day (TID) | OPHTHALMIC | Status: DC | PRN

## 2021-12-31 MED ORDER — ACETAMINOPHEN 650 MG RE SUPP: 650.0000 mg | Freq: Four times a day (QID) | RECTAL | Status: DC | PRN

## 2021-12-31 MED ORDER — MAGNESIUM SULFATE 2 GM/50ML IV SOLN
2.0000 g | Freq: Once | INTRAVENOUS | Status: AC
  Administered 2021-12-31: 2 g via INTRAVENOUS
  Filled 2021-12-31: qty 50

## 2021-12-31 MED ORDER — POTASSIUM CHLORIDE CRYS ER 20 MEQ PO TBCR
20.0000 meq | EXTENDED_RELEASE_TABLET | Freq: Once | ORAL | Status: AC
  Administered 2021-12-31: 20 meq via ORAL
  Filled 2021-12-31: qty 1

## 2021-12-31 MED ORDER — LACTATED RINGERS IV SOLN: INTRAVENOUS | Status: AC

## 2021-12-31 MED ORDER — LACTATED RINGERS IV SOLN: INTRAVENOUS | Status: DC

## 2021-12-31 MED ORDER — SODIUM CHLORIDE 0.9 % IV SOLN: INTRAVENOUS | Status: DC

## 2021-12-31 MED ORDER — ACETAMINOPHEN 325 MG PO TABS
650.0000 mg | ORAL_TABLET | Freq: Four times a day (QID) | ORAL | Status: DC | PRN
  Administered 2021-12-31 – 2022-01-02 (×3): 650 mg via ORAL
  Filled 2021-12-31 (×3): qty 2

## 2021-12-31 NOTE — ED Notes (Signed)
Pt ambulated 1 ast w/ rolling walker to bathroom

## 2021-12-31 NOTE — ED Notes (Signed)
Patient ambulated in hall, using walker, no issue.

## 2021-12-31 NOTE — H&P (Signed)
History and Physical    Isabel Gibbs DJM:[id number] DOB: [birth year] DOA: 12/30/2021  PCP: Karleen Hampshire., MD   Patient coming from: Home   Chief Complaint: Fall with hip pain   HPI: Isabel Gibbs is a very pleasant 76 y.o. female with medical history significant for hypertension, trigeminal neuralgia, and prediabetes, now presenting to the emergency department with hip pain after a fall at home.  The patient reports that she has had some general weakness and malaise over the past couple weeks, was going to sit down on the commode yesterday at approximately 10:30 AM, but somehow missed the seat and ended up falling.  She was experiencing severe pain in her buttock and left hip, was unable to stand due to this, and eventually crawled to a phone.  She believes she may have hit her head, but not hard, and denies any loss of consciousness.  She denies any chest pain but has had occasional palpitation and symptoms feels that her heart is beating too slowly over the past month or more.  She denies headache, nausea, or focal numbness or weakness.  ED Course: Upon arrival to the ED, patient is found to be afebrile and with stable vitals.  EKG features atrial fibrillation with QTC 589 ms.  Chest x-ray negative for acute findings.  Plain radiographs of the pelvis and bilateral hips are negative.  X-ray of the right scapula negative.  CT of the pelvis negative.  Head CT concerning for left inferior frontal lobe lesion with surrounding vasogenic edema and CT cervical spine notable for lytic lesion along the base of the dens.  MRI was attempted with Ativan but the patient was unable to tolerate the study despite this.  Neurosurgery was consulted by the ED physician, indicated that MRI will be needed to formulate plan, and recommended admission for MRI under sedation.  Review of Systems:  All other systems reviewed and apart from HPI, are negative.  Past Medical History:  Diagnosis Date   Hypertension    Prediabetes     Trigeminal neuralgia of right side of face     Past Surgical History:  Procedure Laterality Date   CEREBRAL MICROVASCULAR DECOMPRESSION Right 08/02/2018   [facility]    Social History:   reports that she quit smoking about 23 years ago. Her smoking use included cigarettes. She has never used smokeless tobacco. No history on file for alcohol use and drug use.  Allergies  Allergen Reactions   Codeine Other (See Comments)    Severe GI upset    Lisinopril Cough        Hydrocodone-Acetaminophen Nausea Only    Family History  Problem Relation Age of Onset   Heart failure Brother    Diabetes Brother    Yves Dill Parkinson White syndrome Son      Prior to Admission medications   Medication Sig Start Date End Date Taking? Authorizing Provider  acetaminophen (TYLENOL) 500 MG tablet Take 500-1,000 mg by mouth 2 (two) times daily as needed for mild pain.   Yes [provider]  alendronate (FOSAMAX) 70 MG tablet Take 70 mg by mouth every Friday. 11/14/21  Yes [provider]  gabapentin (NEURONTIN) 300 MG capsule Take 900 mg by mouth See admin instructions. Take 900 mg by mouth in the evening and at bedtime   Yes [provider]  irbesartan (AVAPRO) 150 MG tablet Take 150 mg by mouth at bedtime.   Yes [provider]  SYSTANE ULTRA PF 0.4-0.3 % SOLN Place 1 drop  into both eyes 3 (three) times daily as needed (for dryness).   Yes [provider]  traMADol (ULTRAM) 50 MG tablet Take 50 mg by mouth See admin instructions. Take 50 mg by mouth in the morning and evening   Yes [provider]  Vitamin D, Ergocalciferol, (DRISDOL) 1.25 MG (50000 UNIT) CAPS capsule Take 50,000 Units by mouth every Wednesday.   Yes [provider]  VOLTAREN 1 % GEL Apply 2 g topically 4 (four) times daily as needed (for bilateral knee pain).   Yes [provider]    Physical Exam: Vitals:   12/31/21 0000 12/31/21 0100 12/31/21 0456 12/31/21  0615  BP: (!) 134/47 (!) 120/47 (!) 127/49 (!) 127/53  Pulse: 73 77 86 80  Resp: 20 19 18 18   Temp:      TempSrc:      SpO2: 98% 98% 98% 95%  Weight:      Height:        Constitutional: NAD, calm  Eyes: PERTLA, lids and conjunctivae normal ENMT: Mucous membranes are moist. Posterior pharynx clear of any exudate or lesions.   Neck: supple, no masses  Respiratory:  no wheezing, no crackles. No accessory muscle use.  Cardiovascular: S1 & S2 heard, regular rate and rhythm. No extremity edema.  Abdomen: No distension, no tenderness, soft. Bowel sounds active.  Musculoskeletal: no clubbing / cyanosis. No joint deformity upper and lower extremities.   Skin: no significant rashes, lesions, ulcers. Warm, dry, well-perfused. Neurologic: CN 2-12 grossly intact. Strength 5/5 in all 4 limbs. Alert and fully oriented.  Psychiatric: Very pleasant. Cooperative.    Labs and Imaging on Admission: I have personally reviewed following labs and imaging studies  CBC: Recent Labs  Lab 12/30/21 1732  WBC 11.7*  NEUTROABS 10.2*  HGB 13.6  HCT 43.6  MCV 89.0  PLT 017   Basic Metabolic Panel: Recent Labs  Lab 12/30/21 1732  NA 136  K 3.6  CL 102  CO2 24  GLUCOSE 152*  BUN 13  CREATININE 0.82  CALCIUM 8.6*   GFR: Estimated Creatinine Clearance: 63.9 mL/min (by C-G formula based on SCr of 0.82 mg/dL). Liver Function Tests: Recent Labs  Lab 12/30/21 1732  AST 31  ALT 38  ALKPHOS 59  BILITOT 1.1  PROT 6.3*  ALBUMIN 3.6   No results for input(s): LIPASE, AMYLASE in the last 168 hours. No results for input(s): AMMONIA in the last 168 hours. Coagulation Profile: No results for input(s): INR, PROTIME in the last 168 hours. Cardiac Enzymes: Recent Labs  Lab 12/30/21 1732  CKTOTAL 105   BNP (last 3 results) No results for input(s): PROBNP in the last 8760 hours. HbA1C: No results for input(s): HGBA1C in the last 72 hours. CBG: Recent Labs  Lab 12/30/21 1741  GLUCAP 148*    Lipid Profile: No results for input(s): CHOL, HDL, LDLCALC, TRIG, CHOLHDL, LDLDIRECT in the last 72 hours. Thyroid Function Tests: No results for input(s): TSH, T4TOTAL, FREET4, T3FREE, THYROIDAB in the last 72 hours. Anemia Panel: No results for input(s): VITAMINB12, FOLATE, FERRITIN, TIBC, IRON, RETICCTPCT in the last 72 hours. Urine analysis: No results found for: COLORURINE, APPEARANCEUR, LABSPEC, PHURINE, GLUCOSEU, HGBUR, BILIRUBINUR, KETONESUR, PROTEINUR, UROBILINOGEN, NITRITE, LEUKOCYTESUR Sepsis Labs: @LABRCNTIP (procalcitonin:4,lacticidven:4) ) Recent Results (from the past 240 hour(s))  Resp Panel by RT-PCR (Flu A&B, Covid) Nasopharyngeal Swab     Status: None   Collection Time: 12/30/21  5:28 PM   Specimen: Nasopharyngeal Swab; Nasopharyngeal(NP) swabs in vial transport medium  Result Value Ref Range Status   SARS Coronavirus 2 by RT PCR NEGATIVE NEGATIVE Final    Comment: (NOTE) SARS-CoV-2 target nucleic acids are NOT DETECTED.  The SARS-CoV-2 RNA is generally detectable in upper respiratory specimens during the acute phase of infection. The lowest concentration of SARS-CoV-2 viral copies this assay can detect is 138 copies/mL. A negative result does not preclude SARS-Cov-2 infection and should not be used as the sole basis for treatment or other patient management decisions. A negative result may occur with  improper specimen collection/handling, submission of specimen other than nasopharyngeal swab, presence of viral mutation(s) within the areas targeted by this assay, and inadequate number of viral copies(<138 copies/mL). A negative result must be combined with clinical observations, patient history, and epidemiological information. The expected result is Negative.  Fact Sheet for Patients:  EntrepreneurPulse.com.au  Fact Sheet for Healthcare Providers:  IncredibleEmployment.be  This test is no t yet approved or cleared by  the [place] FDA and  has been authorized for detection and/or diagnosis of SARS-CoV-2 by FDA under an Emergency Use Authorization (EUA). This EUA will remain  in effect (meaning this test can be used) for the duration of the COVID-19 declaration under Section 564(b)(1) of the Act, 21 U.S.C.section 360bbb-3(b)(1), unless the authorization is terminated  or revoked sooner.       Influenza A by PCR NEGATIVE NEGATIVE Final   Influenza B by PCR NEGATIVE NEGATIVE Final    Comment: (NOTE) The Xpert Xpress SARS-CoV-2/FLU/RSV plus assay is intended as an aid in the diagnosis of influenza from Nasopharyngeal swab specimens and should not be used as a sole basis for treatment. Nasal washings and aspirates are unacceptable for Xpert Xpress SARS-CoV-2/FLU/RSV testing.  Fact Sheet for Patients: EntrepreneurPulse.com.au  Fact Sheet for Healthcare Providers: IncredibleEmployment.be  This test is not yet approved or cleared by the [place] FDA and has been authorized for detection and/or diagnosis of SARS-CoV-2 by FDA under an Emergency Use Authorization (EUA). This EUA will remain in effect (meaning this test can be used) for the duration of the COVID-19 declaration under Section 564(b)(1) of the Act, 21 U.S.C. section 360bbb-3(b)(1), unless the authorization is terminated or revoked.  Performed at [facility], [place] [address], [place], [place] [address]      Radiological Exams on Admission: DG Chest 2 View  Result Date: 12/30/2021 CLINICAL DATA:  Fall with back pain. EXAM: CHEST - 2 VIEW COMPARISON:  Chest x-ray 02/23/2017. FINDINGS: The heart size and mediastinal contours are within normal limits. Both lungs are clear. The visualized skeletal structures are unremarkable. There are surgical clips in the upper abdomen. IMPRESSION: No active cardiopulmonary disease. Electronically Signed   By: Ronney Asters M.D.   On: 12/30/2021 19:10   DG  Scapula Right  Result Date: 12/30/2021 CLINICAL DATA:  fall, back/hip pain EXAM: RIGHT SCAPULA - 2+ VIEWS COMPARISON:  None. FINDINGS: There is no evidence of fracture or other focal bone lesions. Degenerative changes of the right acromioclavicular joint. Soft tissues are unremarkable. IMPRESSION: Negative. Electronically Signed   By: Iven Finn M.D.   On: 12/30/2021 19:08   CT Head Wo Contrast  Result Date: 12/30/2021 CLINICAL DATA:  Status post fall EXAM: CT HEAD WITHOUT CONTRAST CT CERVICAL SPINE WITHOUT CONTRAST TECHNIQUE: Multidetector CT imaging of the head and cervical spine was performed following the standard protocol without intravenous contrast. Multiplanar CT image reconstructions of the cervical spine were also generated. COMPARISON:  MRI head 07/13/2018 FINDINGS: CT HEAD FINDINGS  BRAIN: BRAIN Patchy and confluent areas of decreased attenuation are noted throughout the deep and periventricular white matter of the cerebral hemispheres bilaterally, compatible with chronic microvascular ischemic disease. No evidence of large-territorial acute infarction. No parenchymal hemorrhage. Suggestion of a 1.9 x 1.2 x 2cm left inferior frontal lobe lesion with surrounding vasogenic edema. No extra-axial collection. No mass effect or midline shift. No hydrocephalus. Basilar cisterns are patent. Vascular: No hyperdense vessel. Skull: No acute fracture or focal lesion. Sinuses/Orbits: Paranasal sinuses and mastoid air cells are clear. The orbits are unremarkable. Other: None. CT CERVICAL SPINE FINDINGS Alignment: Normal. Skull base and vertebrae: Pannus at the C1-C2 level with associated erosive changes of the skull base/anterior foramen magnum. Multilevel degenerative changes of the spine. Lytic lesion along the base of the dens of unclear etiology. No acute fracture. No aggressive appearing focal osseous lesion or focal pathologic process. Soft tissues and spinal canal: No prevertebral fluid or swelling. No  visible canal hematoma. Upper chest: Unremarkable. Other: None. IMPRESSION: 1. In 1.9 x 1.2 x 2 cm left inferior frontal lobe lesion with surrounding vasogenic edema. Recommend MRI brain with and without contrast for further evaluation. 2. No acute displaced fracture or traumatic listhesis of the cervical spine. 3. Lytic lesion along the base of the dens of unclear etiology. Electronically Signed   By: Iven Finn M.D.   On: 12/30/2021 20:22   CT Cervical Spine Wo Contrast  Result Date: 12/30/2021 CLINICAL DATA:  Status post fall EXAM: CT HEAD WITHOUT CONTRAST CT CERVICAL SPINE WITHOUT CONTRAST TECHNIQUE: Multidetector CT imaging of the head and cervical spine was performed following the standard protocol without intravenous contrast. Multiplanar CT image reconstructions of the cervical spine were also generated. COMPARISON:  MRI head 07/13/2018 FINDINGS: CT HEAD FINDINGS BRAIN: BRAIN Patchy and confluent areas of decreased attenuation are noted throughout the deep and periventricular white matter of the cerebral hemispheres bilaterally, compatible with chronic microvascular ischemic disease. No evidence of large-territorial acute infarction. No parenchymal hemorrhage. Suggestion of a 1.9 x 1.2 x 2cm left inferior frontal lobe lesion with surrounding vasogenic edema. No extra-axial collection. No mass effect or midline shift. No hydrocephalus. Basilar cisterns are patent. Vascular: No hyperdense vessel. Skull: No acute fracture or focal lesion. Sinuses/Orbits: Paranasal sinuses and mastoid air cells are clear. The orbits are unremarkable. Other: None. CT CERVICAL SPINE FINDINGS Alignment: Normal. Skull base and vertebrae: Pannus at the C1-C2 level with associated erosive changes of the skull base/anterior foramen magnum. Multilevel degenerative changes of the spine. Lytic lesion along the base of the dens of unclear etiology. No acute fracture. No aggressive appearing focal osseous lesion or focal pathologic  process. Soft tissues and spinal canal: No prevertebral fluid or swelling. No visible canal hematoma. Upper chest: Unremarkable. Other: None. IMPRESSION: 1. In 1.9 x 1.2 x 2 cm left inferior frontal lobe lesion with surrounding vasogenic edema. Recommend MRI brain with and without contrast for further evaluation. 2. No acute displaced fracture or traumatic listhesis of the cervical spine. 3. Lytic lesion along the base of the dens of unclear etiology. Electronically Signed   By: Iven Finn M.D.   On: 12/30/2021 20:22   CT PELVIS WO CONTRAST  Result Date: 12/30/2021 CLINICAL DATA:  Hip trauma, fracture suspected, xray done 3 status post fall EXAM: CT PELVIS WITHOUT CONTRAST TECHNIQUE: Multidetector CT imaging of the pelvis was performed following the standard protocol without intravenous contrast. COMPARISON:  X-ray bilateral hips 12/30/2021 FINDINGS: Urinary Tract:  No abnormality visualized. Bowel: Scattered colonic  diverticulosis. Otherwise unremarkable visualized pelvic bowel loops. Vascular/Lymphatic: Atherosclerotic plaque. No pathologically enlarged lymph nodes. No significant vascular abnormality seen. Reproductive:  No mass or other significant abnormality Other: No intraperitoneal free fluid. No intraperitoneal free gas. No organized fluid collection. Musculoskeletal: No large hematoma formation. Mild subcutaneus soft tissue edema along the left hip. No acute displaced fracture of the hips or pelvis. No pelvic bone diastasis. Degenerative changes of visualized lower lumbar spine. Lucency of the left acetabulum (6:50 2-56) likely represents a nutrient vessel. At least mild degenerative changes of bilateral hips. Old healed coccyx fracture with fusion to the S5 vertebral body. IMPRESSION: 1. Negative for acute traumatic injury. 2.  Aortic Atherosclerosis (ICD10-I70.0). Electronically Signed   By: Iven Finn M.D.   On: 12/30/2021 21:43   MR BRAIN WO CONTRAST  Result Date: 12/31/2021 CLINICAL  DATA:  Follow-up examination for brain lesion. EXAM: MRI HEAD WITHOUT CONTRAST TECHNIQUE: Multiplanar, multiecho pulse sequences of the brain and surrounding structures were obtained without intravenous contrast. COMPARISON:  Prior MRI from 12/30/2021. FINDINGS: Examination was performed as a re-attempt at obtaining postcontrast images of the previously identified left frontal lobe abnormality. Unfortunately, the patient was unable to tolerate the exam, with no postcontrast imaging performed. Axial SWI and T1 weighted sequences only were performed. Additionally, the provided images are moderately degraded by motion artifact. Given the technical limitations, no new findings are seen within the brain. The left frontal lobe lesion is again not well seen and incompletely assessed. IMPRESSION: Technically limited exam due to the patient's inability to tolerate the exam. Unfortunately, postcontrast images of the brain were again unable to be obtained. No new findings identified. Electronically Signed   By: Jeannine Boga M.D.   On: 12/31/2021 03:16   MR BRAIN WO CONTRAST  Result Date: 12/30/2021 CLINICAL DATA:  Follow-up examination for abnormal CT, possible intracranial mass. EXAM: MRI HEAD WITHOUT CONTRAST TECHNIQUE: Multiplanar, multiecho pulse sequences of the brain and surrounding structures were obtained without intravenous contrast. COMPARISON:  Prior CT from earlier the same day as well as previous MRI from 07/13/2018. FINDINGS: Brain: Examination technically limited as the patient was unable to tolerate the full length of the exam, postcontrast sequences were not performed. Additionally, images provided are moderately to severely intermittently degraded by motion artifact. Diffuse prominence of the CSF containing spaces compatible generalized cerebral atrophy. Patchy and confluent T2/FLAIR hyperintensity involving the periventricular deep white matter both cerebral hemispheres most consistent with  chronic small vessel ischemic disease, moderate in nature. Small remote right cerebellar infarct noted. There is suggestion of a focal intraparenchymal abnormality centered at the anterior/inferior left frontal lobe at the left gyrus rectus, corresponding with abnormality on prior CT. This measures approximately 2.3 x 1.3 cm (series 16, image 16). Surrounding T2/FLAIR signal abnormality consistent with vasogenic edema. Edema extends along the genu of the corpus callosum, crossing the midline to involve the contralateral right frontal lobe (series 17, image 17). No definite intrinsic T1 hyperintensity. No visible associated susceptibility artifact, although evaluation markedly limited by motion. Finding is indeterminate, and could reflect an intraparenchymal mass, possibly a primary CNS neoplasm or solitary intracranial metastasis. A possible subacute hematoma is also considered, although evaluation limited given artifact on this exam. No other visible mass lesion, mass effect, or midline shift. No hydrocephalus or extra-axial fluid collection. No other foci of diffusion abnormality to suggest acute or subacute ischemia. Gray-white matter differentiation otherwise maintained. Pituitary gland suprasellar region normal. Midline structures intact. Vascular: Major intracranial vascular flow voids are grossly  maintained at the skull base. Skull and upper cervical spine: Prominent degenerative thickening/pannus formation noted at the tectorial membrane and dens. Craniocervical junction otherwise unremarkable. Bone marrow signal intensity within normal limits. No focal marrow replacing lesion. Hyperostosis frontalis interna noted. Prior right retrosigmoid craniotomy noted. No acute scalp soft tissue abnormality. Sinuses/Orbits: Patient status post bilateral ocular lens replacement. Scattered mucosal thickening noted within the ethmoidal air cells. Paranasal sinuses are otherwise clear. No mastoid effusion. Other: None.  IMPRESSION: 1. Technically limited exam due to motion artifact and the patient's inability to tolerate the full length of the study. Post-contrast sequences were not performed. 2. Approximate 2.3 x 1.3 cm intraparenchymal abnormality centered at the anterior/inferior left frontal lobe, corresponding with abnormality on prior CT. Surrounding vasogenic edema without midline shift. Finding is indeterminate, and could reflect an intraparenchymal mass, possibly a primary CNS neoplasm or solitary intracranial metastasis. A possible evolving subacute hematoma is also considered. Further assessment with postcontrast imaging as the patient is able to tolerate is recommended. Additionally, a short interval follow-up MRI to evaluate for evolutionary changes may be helpful as well. 3. No other acute intracranial abnormality. 4. Underlying atrophy with moderate chronic small vessel ischemic disease. Electronically Signed   By: Jeannine Boga M.D.   On: 12/30/2021 23:25   MR CERVICAL SPINE WO CONTRAST  Result Date: 12/31/2021 CLINICAL DATA:  Initial evaluation for possible bone lesion. EXAM: MRI CERVICAL SPINE WITHOUT CONTRAST TECHNIQUE: Multiplanar, multisequence MR imaging of the cervical spine was performed. No intravenous contrast was administered. COMPARISON:  Prior CT from 12/30/2021. FINDINGS: Alignment: Examination degraded by motion artifact. Additionally, patient was unable to tolerate the full length of the exam. No postcontrast images were performed. Vertebral bodies normally aligned with preservation of the normal cervical lordosis. No listhesis. Vertebrae: Vertebral body height maintained without acute or chronic fracture. Bone marrow signal intensity within normal limits. 6 mm T1/T2 hyperintense lesion seen at the base of the dens, consistent with a small benign hemangioma. This corresponds with lesion noted on prior CT. No other worrisome osseous lesions seen elsewhere within the cervical spine. No  abnormal marrow edema. Cord: Normal signal and morphology. Posterior Fossa, vertebral arteries, paraspinal tissues: Visualized brain and posterior fossa within normal limits. Degenerative thickening about the tectorial membrane with pannus formation noted. No significant stenosis at the craniocervical junction. Paraspinous and prevertebral soft tissues within normal limits. Normal flow voids seen within the vertebral arteries bilaterally. Few small thyroid nodules noted, largest of which measures 12 mm on the right, of doubtful significance given size and patient age, no follow-up imaging recommended (ref: J Am Coll Radiol. 2015 Feb;12(2): 143-50). Disc levels: C2-C3: Shallow left eccentric disc osteophyte complex mildly flattens the ventral thecal sac. Minimal flattening of the left ventral cord without cord signal changes. Mild ligamentum flavum hypertrophy. Resultant mild spinal stenosis. Foramina remain patent. C3-C4: Small central disc protrusion indents the ventral thecal sac (series 13, image 22). No more than mild spinal stenosis without frank cord impingement. Left-sided uncovertebral and facet hypertrophy with resultant mild to moderate left C4 foraminal stenosis. Right neural foramen remains patent. C4-C5: Right paracentral disc protrusion indents the right ventral thecal sac (series 13, image 27). Minimal flattening of the ventral cord without cord signal changes. Mild spinal stenosis. Superimposed uncovertebral and facet hypertrophy with resultant moderate left C5 foraminal narrowing. Right neural foramina remains patent. C5-C6: Small central disc protrusion indents the ventral thecal sac. No significant spinal stenosis or cord deformity. Superimposed mild facet hypertrophy. Foramina remain patent. C6-C7:  Anterior endplate spurring without significant disc bulge. Mild facet hypertrophy. No stenosis. C7-T1:  Negative interspace.  Mild facet hypertrophy.  No stenosis. Visualized upper thoracic spine  demonstrates no significant finding. IMPRESSION: 1. 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 No other worrisome osseous lesions within the cervical spine. 2. 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 3. Multifactorial degenerative changes with resultant mild to moderate left C4 and C5 foraminal stenosis. Electronically Signed   By: Jeannine Boga M.D.   On: 12/31/2021 03:10   DG Hip Unilat W or Wo Pelvis 2-3 Views Left  Result Date: 12/30/2021 CLINICAL DATA:  fall, back/hip pain EXAM: DG HIP (WITH OR WITHOUT PELVIS) 2-3V RIGHT; DG HIP (WITH OR WITHOUT PELVIS) 2-3V LEFT COMPARISON:  None. FINDINGS: Markedly limited evaluation due to overlapping osseous structures and overlying soft tissues. There is no evidence of hip fracture or dislocation bilaterally. No acute displaced fracture or diastasis of the bones of the pelvis. Visualized lower lumbar spine demonstrates degenerative changes. Limited evaluation of the sacrum due to overlying bowel. There is no evidence of severe arthropathy or other focal bone abnormality. IMPRESSION: Negative for acute traumatic injury. Electronically Signed   By: Iven Finn M.D.   On: 12/30/2021 19:09   DG Hip Unilat W or Wo Pelvis 2-3 Views Right  Result Date: 12/30/2021 CLINICAL DATA:  fall, back/hip pain EXAM: DG HIP (WITH OR WITHOUT PELVIS) 2-3V RIGHT; DG HIP (WITH OR WITHOUT PELVIS) 2-3V LEFT COMPARISON:  None. FINDINGS: Markedly limited evaluation due to overlapping osseous structures and overlying soft tissues. There is no evidence of hip fracture or dislocation bilaterally. No acute displaced fracture or diastasis of the bones of the pelvis. Visualized lower lumbar spine demonstrates degenerative changes. Limited evaluation of the sacrum due to overlying bowel. There is no evidence of severe arthropathy or other focal bone abnormality. IMPRESSION: Negative for  acute traumatic injury. Electronically Signed   By: Iven Finn M.D.   On: 12/30/2021 19:09    EKG: Independently reviewed. Atrial fibrillation, QTc 589 ms.   Assessment/Plan   1. Brain lesion; dens lesion   - Presents with hip pain after a fall and found to have brain lesion on CT  - Neurosurgery consulted by ED and she will need MRI before plan can be formulated, was unable to tolerate MRI despite Ativan and neurosurgery recommending MRI with sedation    2. Atrial fibrillation  - In rate-controlled a fib in ED, appears to be new  - CHADS-VASc at least 26 (age x2, gender, HTN)  - Hold anticoagulation pending neurosurgery consult, check TSH    3. Prolonged QT interval  - QTc 589 ms in ED  - Check magnesium level, supplement potassium, avoid QT-prolonging medications, repeat EKG tomorrow    4. Hypertension  - Continue ARB  5. 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

## 2021-12-31 NOTE — Progress Notes (Signed)
Patient seen and examined.  Admission HPI reviewed.  She is still complaining of left hip pain.  Will order lidocaine patch.  She was not able to tolerate MRI due to claustrophobia.  I have placed order for MRI without contrast under anesthesia. Further  the recommendation depending on MRI results.  For A. fib, appears to be a new diagnosis for patient.  Hold on anticoagulation pending neurosurgery consult and MRI results.  Patient might require brain biopsy.  She is currently rate controlled. Hypomagnesemia; Replete IV.

## 2021-12-31 NOTE — Consult Note (Signed)
Neurosurgery Consultation  Reason for Consult: Brain mass Referring Physician: Regenia Skeeter  CC: Fall  HPI: This is a 76 y.o. woman that presents after a fall off her toilet, no paroxysmal changes in mentation or post-ictal period, no presyncopal symptoms. Main complaint was L buttock pain but that's improving. Trauma workup discovered a new L F mass with edema, she is right handed, she does endorse a few weeks of not feeling like herself but again nothing specific or paroxysmal. No new weakness, numbness, or parasthesias, no change in speech. No recent use of anti-platelet or anti-coagulant medications. 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   ROS: A 14 point ROS was performed and is negative except as noted in the HPI.   PMHx:  Past Medical History:  Diagnosis Date   Hypertension    Prediabetes    Trigeminal neuralgia of right side of face    FamHx:  Family History  Problem Relation Age of Onset   Heart failure Brother    Diabetes Brother    Yves Dill Parkinson White syndrome Son    SocHx:  reports that she quit smoking about 23 years ago. Her smoking use included cigarettes. She has never used smokeless tobacco. No history on file for alcohol use and drug use.  Exam: Vital signs in last 24 hours: Temp:  [99 F (37.2 C)] 99 F (37.2 C) (01/02 1646) Pulse Rate:  [68-93] 80 (01/03 0615) Resp:  [14-27] 18 (01/03 0615) BP: (109-159)/(47-95) 127/53 (01/03 0615) SpO2:  [88 %-99 %] 95 % (01/03 0615) Weight:  [83.5 kg] 83.5 kg (01/02 1645) General: Awake, alert, cooperative, lying in bed in NAD Head: Normocephalic and atruamatic HEENT: Neck supple Pulmonary: breathing room air comfortably, no evidence of increased work of breathing Cardiac: RRR Abdomen: S NT ND Extremities: Warm and well perfused x4 Neuro: AOx3, PERRL, EOMI, FS, speech fluent with normal content Strength 5/5 x4, SILTx4, no drift   Assessment and Plan: 76 y.o. woman p/w progressive weakness for a  few weeks, then fall off toilet yesterday with severe buttock pain. 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 MRI is unfortunately not diagnostic due to artifact, able to appreciate edema on the FSE T2 but not much else. Diffusion sequences are diagnostic quality, does not look like an abscess on DWI.  -rpt MRI pending, depending on those results, will likely require a biopsy and/or resection depending on location. Discussed this with her, she is aware of the brain mass.  -please call with any concerns or questions  Judith Part, MD 12/31/21 7:46 AM [facility]

## 2022-01-01 ENCOUNTER — Encounter (HOSPITAL_COMMUNITY)
Admission: EM | Disposition: A | Discharge status: Home / Self Care | Payer: Self-pay | Source: Home / Self Care | Attending: Family Medicine

## 2022-01-01 ENCOUNTER — Observation Stay (HOSPITAL_COMMUNITY): Payer: Medicare Other | Admitting: Certified Registered Nurse Anesthetist

## 2022-01-01 ENCOUNTER — Observation Stay (HOSPITAL_COMMUNITY): Payer: Medicare Other

## 2022-01-01 DIAGNOSIS — G939 Disorder of brain, unspecified: Secondary | ICD-10-CM | POA: Diagnosis not present

## 2022-01-01 DIAGNOSIS — I4891 Unspecified atrial fibrillation: Secondary | ICD-10-CM | POA: Diagnosis not present

## 2022-01-01 DIAGNOSIS — I10 Essential (primary) hypertension: Secondary | ICD-10-CM | POA: Diagnosis not present

## 2022-01-01 DIAGNOSIS — R9431 Abnormal electrocardiogram [ECG] [EKG]: Secondary | ICD-10-CM | POA: Diagnosis not present

## 2022-01-01 HISTORY — PX: RADIOLOGY WITH ANESTHESIA: SHX6223

## 2022-01-01 LAB — HEMOGLOBIN A1C
Hgb A1c MFr Bld: 5.9 % — ABNORMAL HIGH (ref 4.8–5.6)
Mean Plasma Glucose: 122.63 mg/dL

## 2022-01-01 IMAGING — CT CT HEAD W/O CM
3 of 4 series · 15 of 47 positions shown, 18 images · non-contrast
Comparison: [DATE] CT head, correlation is also made with MRI
brain [DATE]

CLINICAL DATA: BrainLAB CT for operative planning.

EXAM:
CT HEAD WITHOUT CONTRAST
TECHNIQUE: Contiguous axial images were obtained from the base of the skull
through the vertex without intravenous contrast.

[Series 3: axial thins · axial · 0.48mm/px · z∈[-528,-368]mm · 9 of 192 slices shown, 12 images]
[im 16/192  brain]
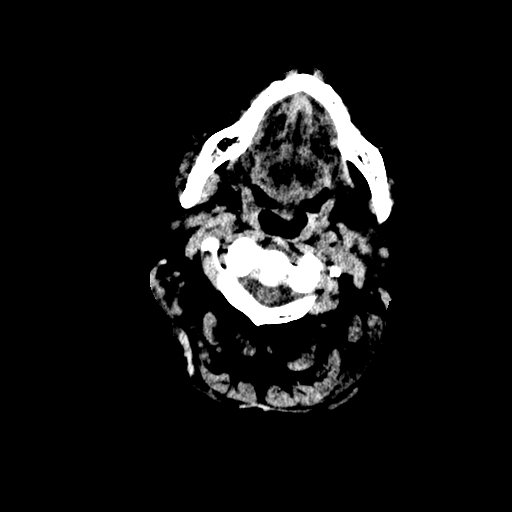
[im 16/192  bone]
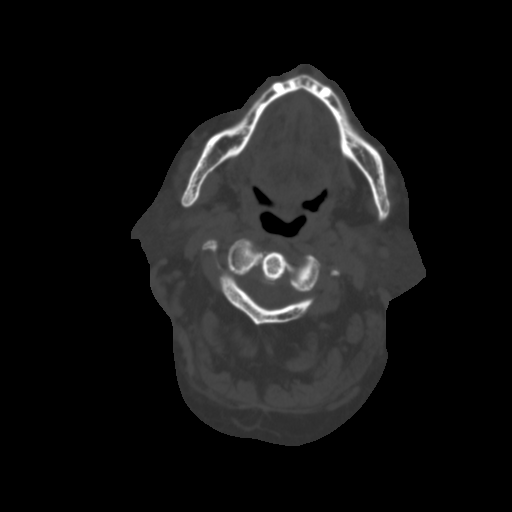
[im 32/192  brain]
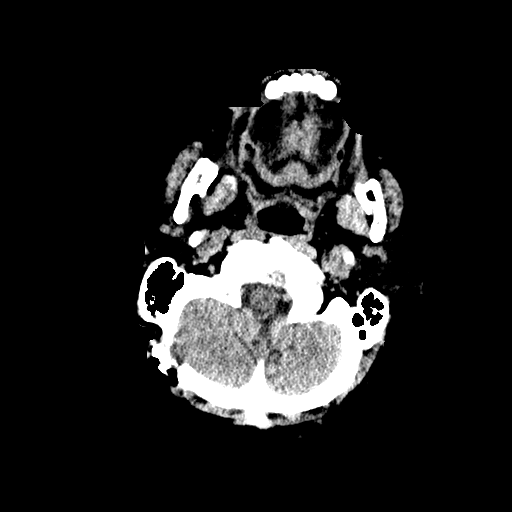
[im 56/192  brain]
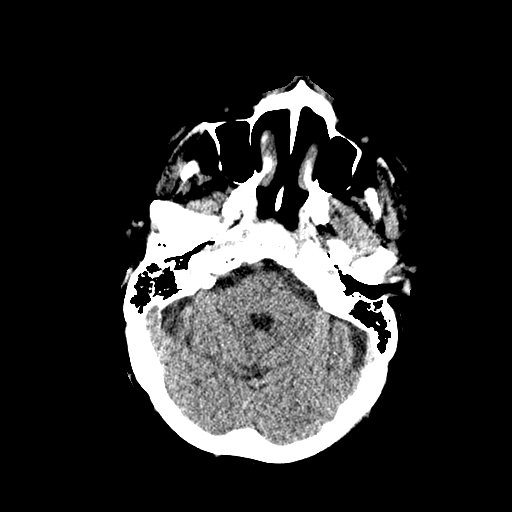
[im 72/192  brain]
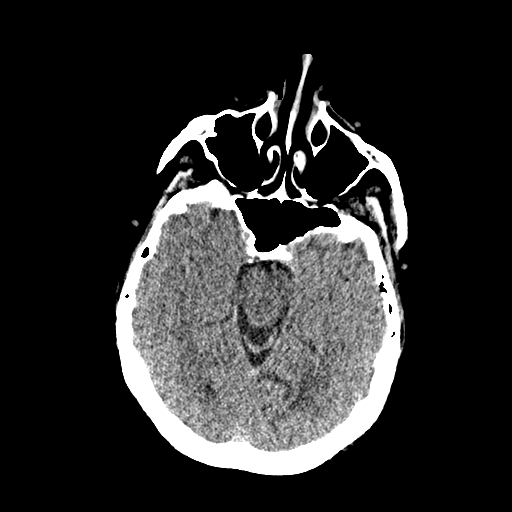
[im 96/192  brain]
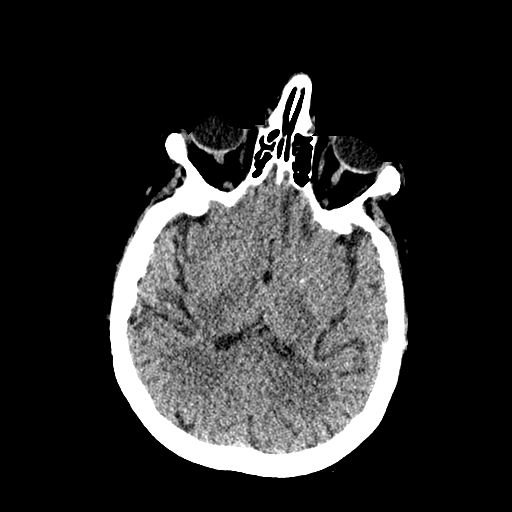
[im 96/192  bone]
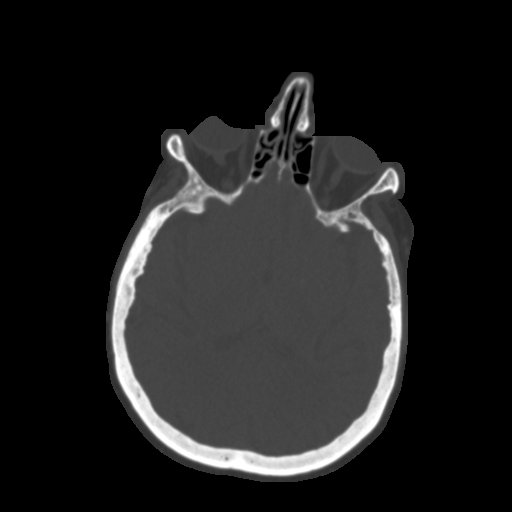
[im 120/192  brain]
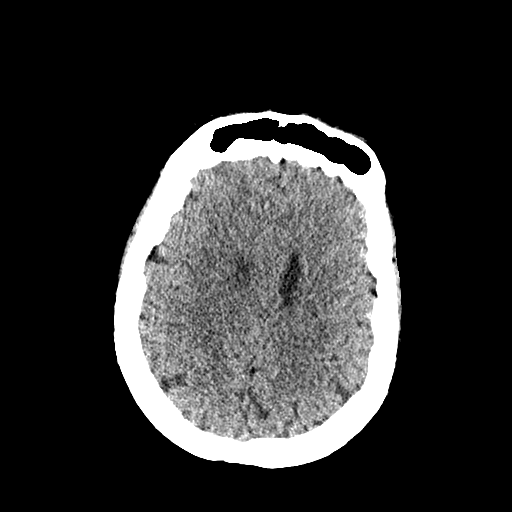
[im 136/192  brain]
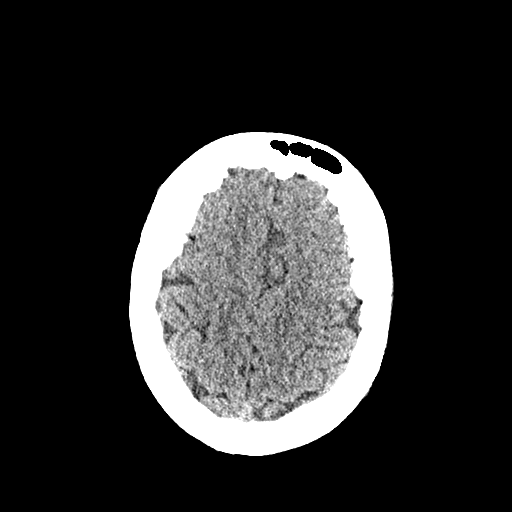
[im 160/192  brain]
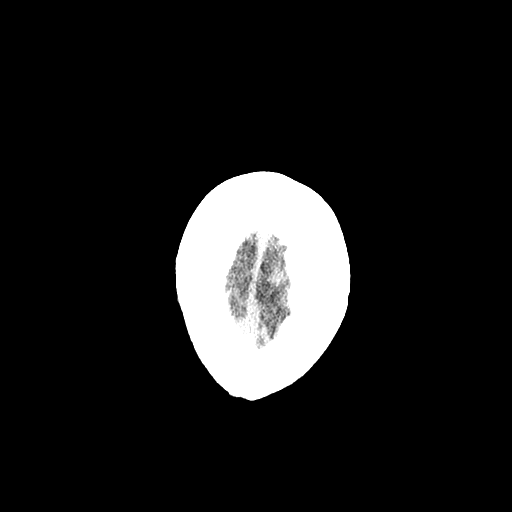
[im 176/192  brain]
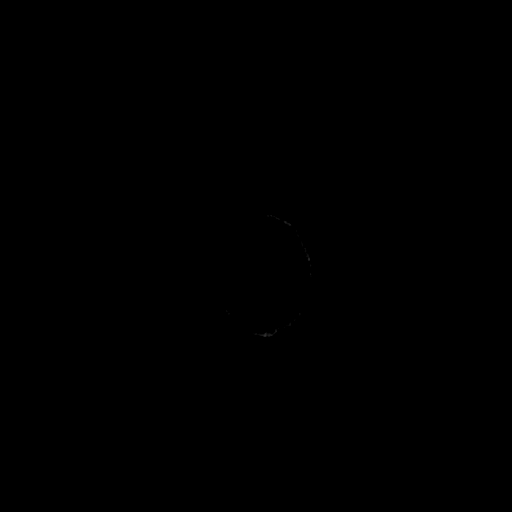
[im 176/192  bone]
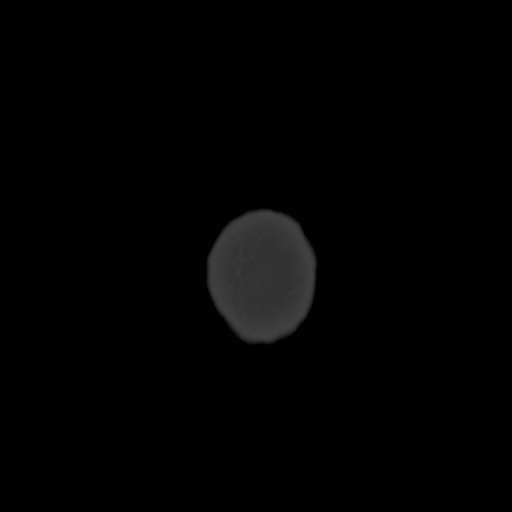

[Series 6: coronal · coronal · 0.40mm/px · 3 of 125 slices shown]
[im 43/125  brain]
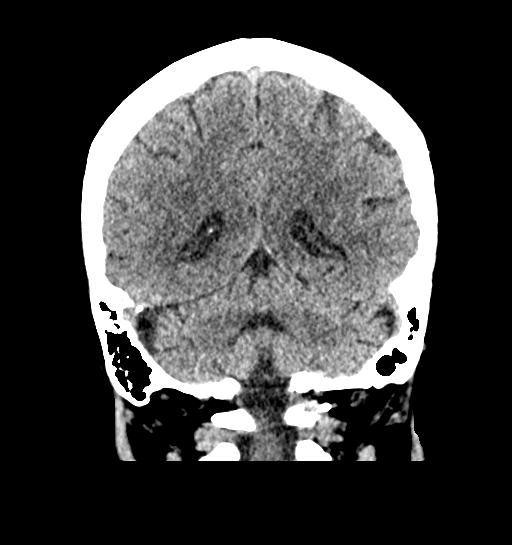
[im 56/125  brain]
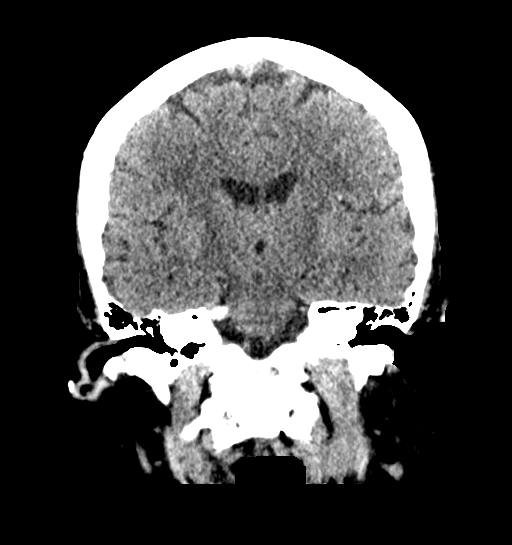
[im 69/125  brain]
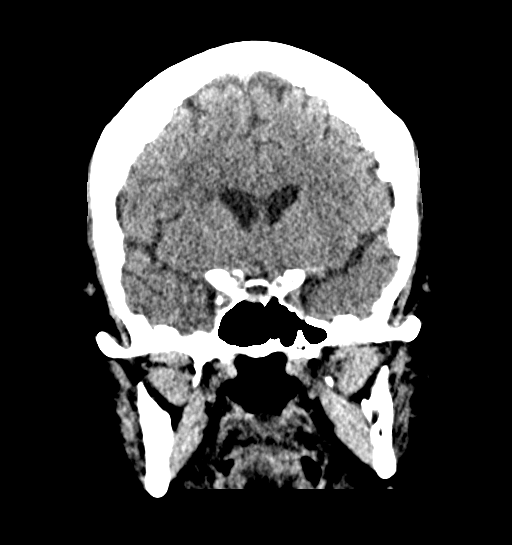

[Series 7: sagittal · sagittal · 0.44mm/px · 3 of 125 slices shown]
[im 42/125  brain]
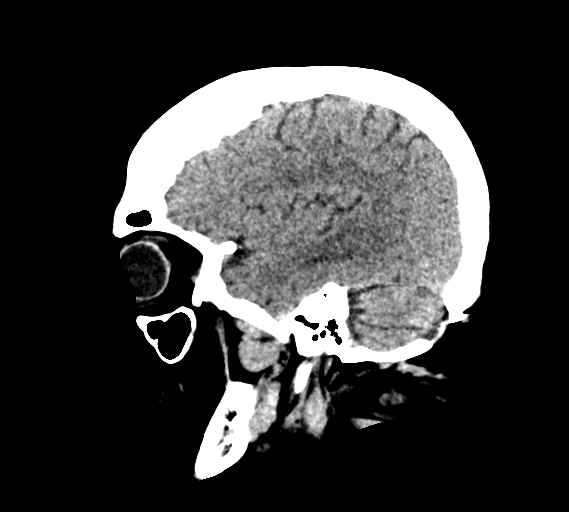
[im 63/125  brain]
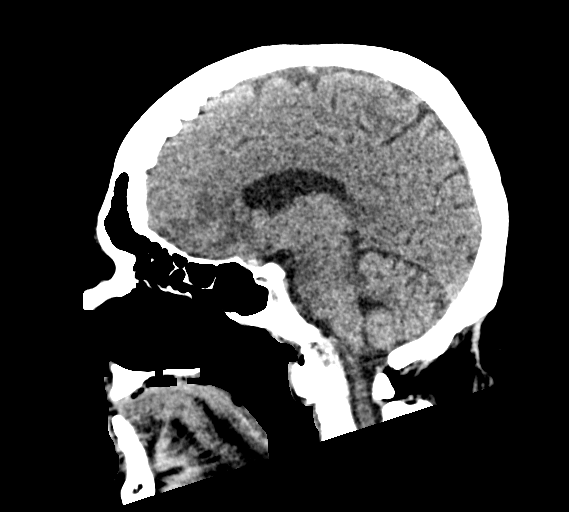
[im 83/125  brain]
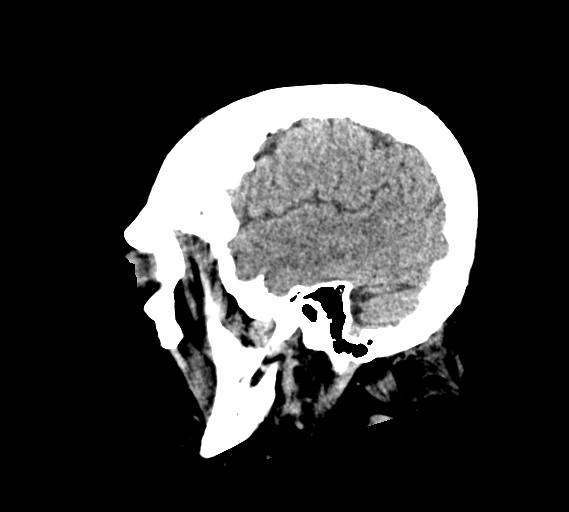

[15 of 47 positions shown; findings below may reference images not displayed]

FINDINGS: Brain: Redemonstrated 2.1 x 1.1 x 2.0 cm left inferior frontal lobe
lesion with surrounding vasogenic edema (series 3, image 104),
unchanged when remeasured similarly. Mild mass effect on the left
frontal horn. No hydrocephalus or extra-axial collection. No acute
infarct, hemorrhage, or midline shift.

Hyperdense lesion along the right aspect of the tentorium adjacent
to the pons, which measures 6 x 9 x 4 mm (series 3, image 65 and
series 6, image 69), present on the prior CT but not well seen
likely secondary to volume averaging. This correlates with a mildly
enhancing lesion on the prior MRI (series 10, image 18).

Vascular: No hyperdense vessel. Atherosclerotic calcifications in
the intracranial carotid and vertebral arteries.

Skull: Hyperostosis frontalis. Prior right suboccipital craniotomy.
Partially imaged lucent lesions in the body and posterior elements
of C2. No acute fracture.

Sinuses/Orbits: No acute finding. Status post bilateral lens
replacements.

Other: Trace fluid in right mastoid air cells.
IMPRESSION: 1. Redemonstrated 2.1 cm left inferior frontal lobe lesion with
surrounding vasogenic edema, not significantly changed from prior
exam.
2. Hyperdense subcentimeter lesion along the right aspect of the
tentorium, adjacent to the pons, favored to represent a meningioma
and better seen on the current CT than on the prior MRI.

## 2022-01-01 IMAGING — MR MR HEAD WO/W CM
6 of 13 series · 22 of 48 positions shown · IV contrast (gadavist)
Comparison: Brain MRI [DATE], CT head [DATE], brain MRI
[DATE]

CLINICAL DATA: Left frontal mass

EXAM:
MRI HEAD WITHOUT AND WITH CONTRAST
TECHNIQUE: Multiplanar, multiecho pulse sequences of the brain and surrounding
structures were obtained without and with intravenous contrast.
CONTRAST:  8mL GADAVIST GADOBUTROL 1 MMOL/ML IV SOLN

[Series 2: DWI · axial · 3.0mm · 0.94mm/px · z∈[-26,+101]mm · 8 of 93 slices shown (1 of 2)]
[im 1/93]
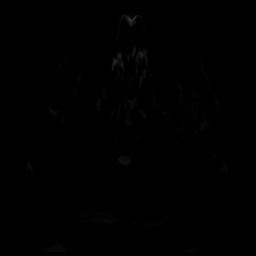
[im 14/93]
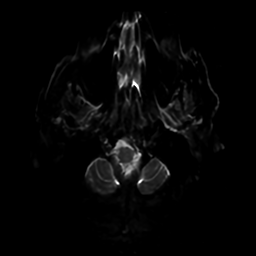
[im 27/93]
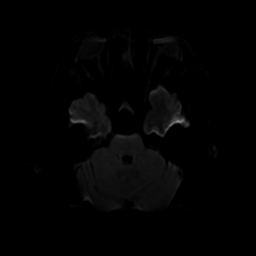
[im 40/93]
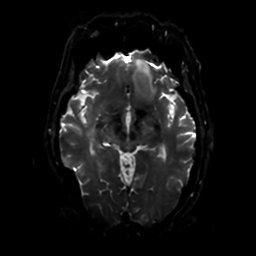
[im 53/93]
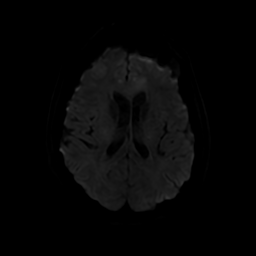
[im 66/93]
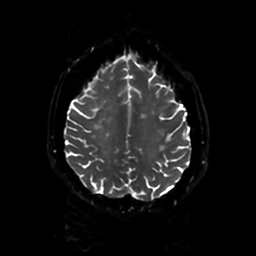
[im 79/93]
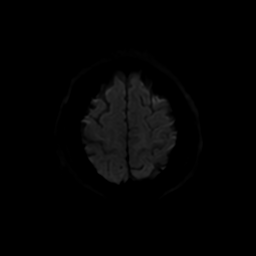
[im 93/93]
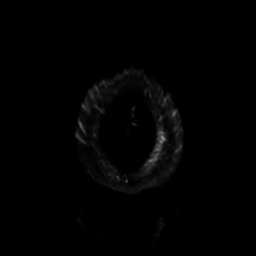

[Series 3: DWI · coronal · 4.0mm · 0.94mm/px · 5 of 65 slices shown (2 of 2)]
[im 1/65]
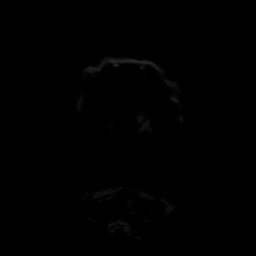
[im 17/65]
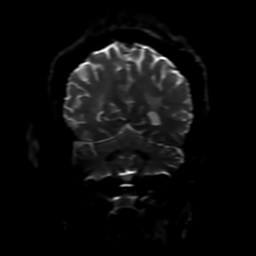
[im 33/65]
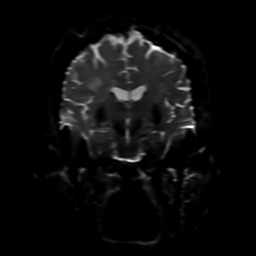
[im 49/65]
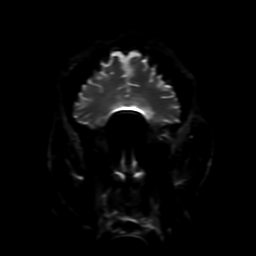
[im 65/65]
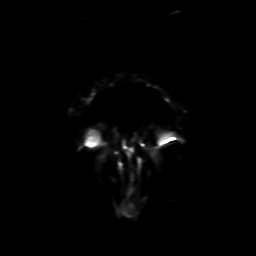

[Series 4: FLAIR · sagittal · 5.0mm · 0.23mm/px · 2 of 23 slices shown (1 of 2)]
[im 1/23]
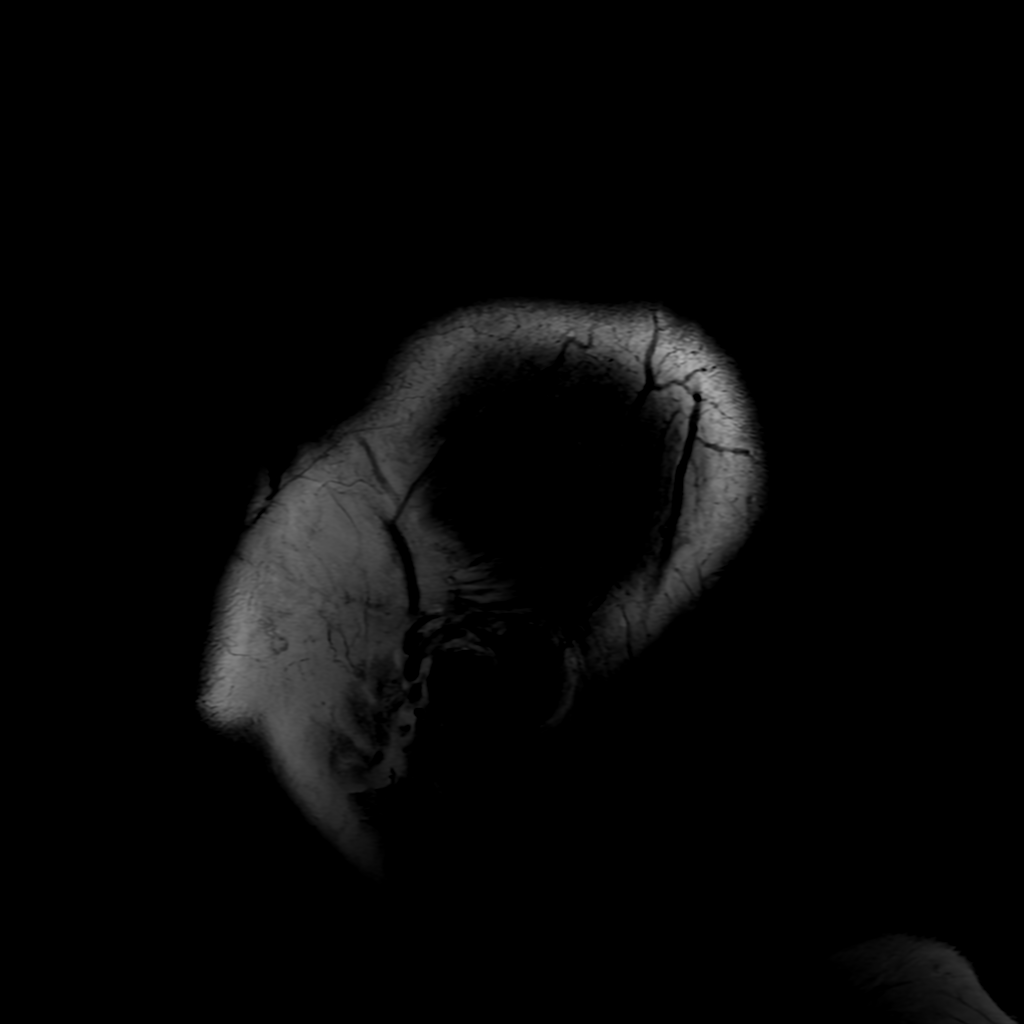
[im 23/23]
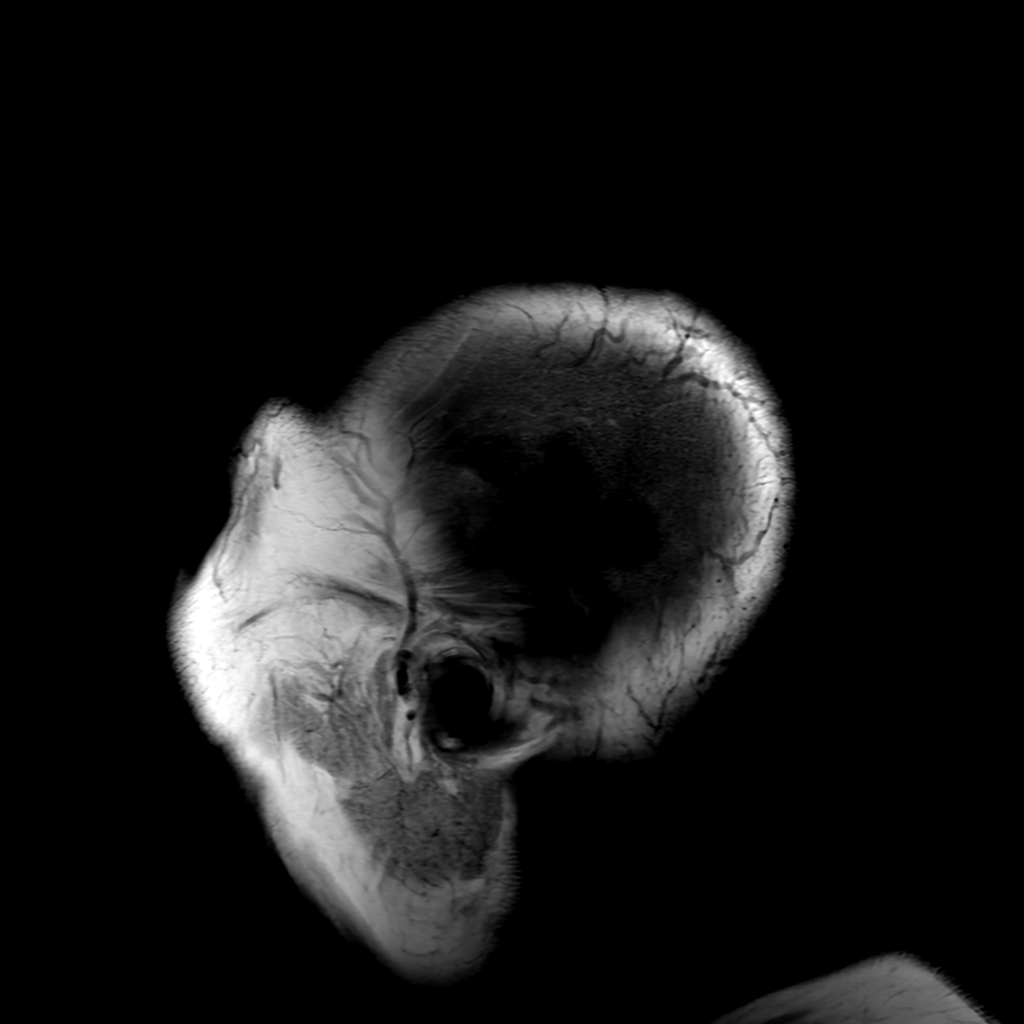

[Series 6: FLAIR · axial · 4.0mm · 0.45mm/px · z∈[-28,+99]mm · 3 of 33 slices shown (2 of 2)]
[im 1/33]
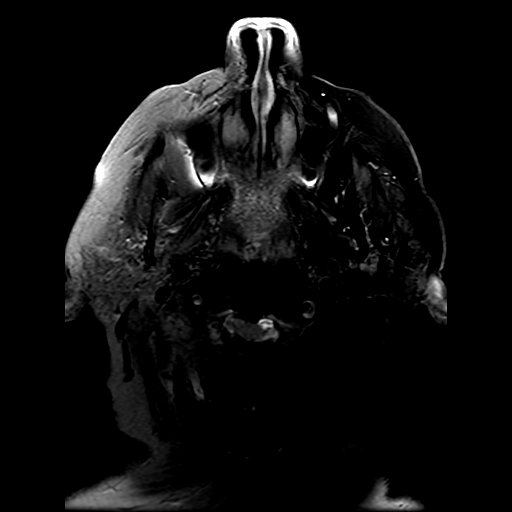
[im 17/33]
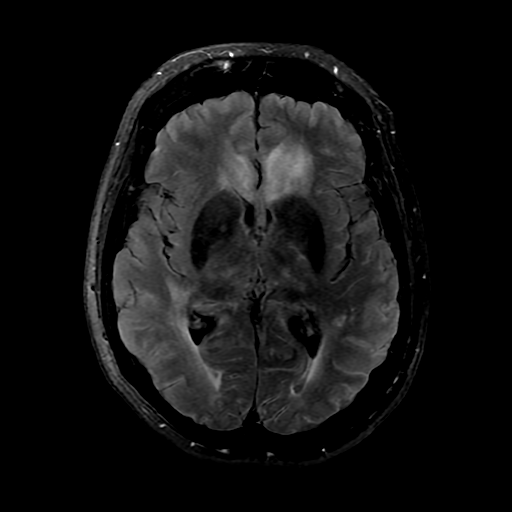
[im 33/33]
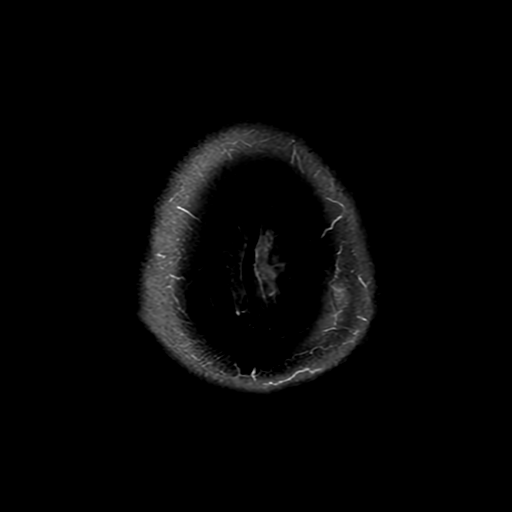

[Series 12: FLAIR post-contrast · sagittal · 5.0mm · 0.23mm/px · 1 of 17 slices shown]
[im 1/17]
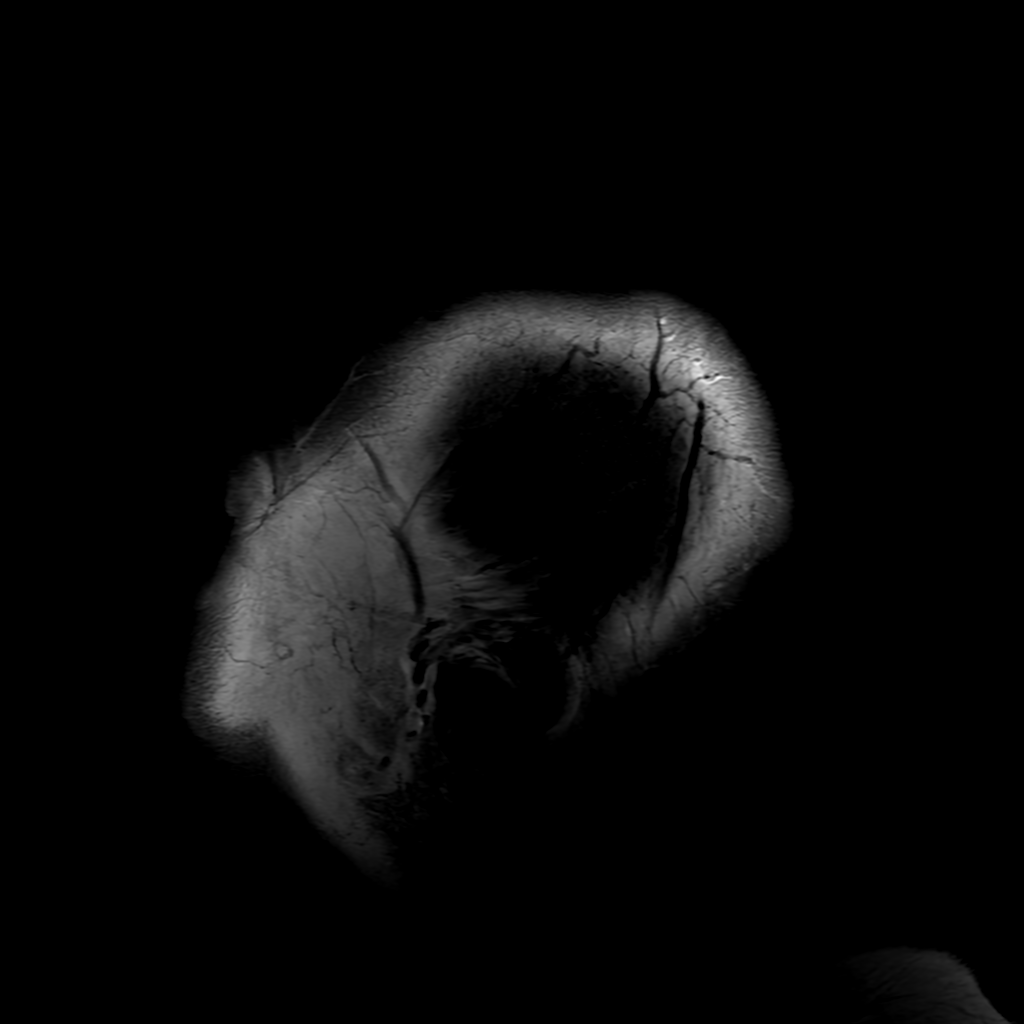

[Series 250: ADC · axial · 3.0mm · 0.94mm/px · z∈[-26,+58]mm · 3 of 48 slices shown]
[im 1/48]
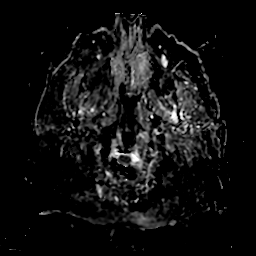
[im 16/48]
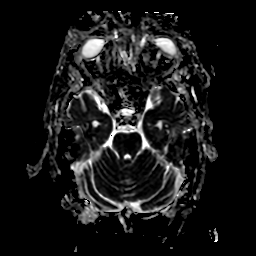
[im 32/48]
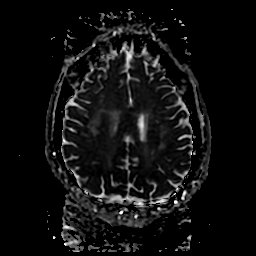

[22 of 48 positions shown; findings below may reference images not displayed]

FINDINGS: Brain: There is masslike FLAIR signal abnormality in the left
anterior frontal lobe. There is homogeneous enhancement within the
area of signal abnormality measuring 1.8 cm AP by 1.4 cm TV by
cm cc. There is faint diffusion restriction within this area of
enhancement. FLAIR signal abnormality surrounding the enhancement is
seen in the adjacent left frontal lobe subcortical white matter as
well as crossing the midline via the genu of the corpus callosum
into the right anterior frontal lobe. No other convincing
enhancement is seen within the areas of FLAIR signal abnormality.
There is no SWI signal dropout to suggest hemorrhage. There is no
cystic change.

Background of mild parenchymal volume loss and chronic white matter
microangiopathy is unchanged. Sulcal FLAIR hyperintensity overlying
the occipital lobes is favored to be artifactual.

There is partial effacement of the left frontal horn by the
above-described mass. The ventricles are otherwise normal in size.

There is no other abnormal enhancement.  There is no midline shift.

Vascular: Normal flow voids.

Skull and upper cervical spine: Normal marrow signal.

Sinuses/Orbits: The paranasal sinuses are clear. Bilateral lens
implants are in place. The globes and orbits are otherwise
unremarkable.

Other: None.
IMPRESSION: Homogeneously enhancing lesion in the left frontal lobe with
surrounding edema and/or infiltrative tumor which crosses midline
via the genu of the corpus callosum described above. Finding is most
concerning for CNS lymphoma with less likely differential including
primary glial tumor.

## 2022-01-01 SURGERY — MRI WITH ANESTHESIA
Anesthesia: General

## 2022-01-01 MED ORDER — FENTANYL CITRATE (PF) 250 MCG/5ML IJ SOLN
INTRAMUSCULAR | Status: AC
  Filled 2022-01-01: qty 5

## 2022-01-01 MED ORDER — FENTANYL CITRATE (PF) 250 MCG/5ML IJ SOLN
INTRAMUSCULAR | Status: DC | PRN
  Administered 2022-01-01: 50 ug via INTRAVENOUS

## 2022-01-01 MED ORDER — LIDOCAINE 2% (20 MG/ML) 5 ML SYRINGE
INTRAMUSCULAR | Status: DC | PRN
  Administered 2022-01-01: 60 mg via INTRAVENOUS

## 2022-01-01 MED ORDER — CHLORHEXIDINE GLUCONATE 0.12 % MT SOLN
15.0000 mL | Freq: Once | OROMUCOSAL | Status: AC
  Administered 2022-01-01: 15 mL via OROMUCOSAL

## 2022-01-01 MED ORDER — ONDANSETRON HCL 4 MG/2ML IJ SOLN
INTRAMUSCULAR | Status: DC | PRN
  Administered 2022-01-01: 4 mg via INTRAVENOUS

## 2022-01-01 MED ORDER — CHLORHEXIDINE GLUCONATE 0.12 % MT SOLN
OROMUCOSAL | Status: AC
  Filled 2022-01-01: qty 15

## 2022-01-01 MED ORDER — PROPOFOL 10 MG/ML IV BOLUS
INTRAVENOUS | Status: AC
  Filled 2022-01-01: qty 20

## 2022-01-01 MED ORDER — ORAL CARE MOUTH RINSE: 15.0000 mL | Freq: Once | OROMUCOSAL | Status: AC

## 2022-01-01 MED ORDER — GADOBUTROL 1 MMOL/ML IV SOLN
8.0000 mL | Freq: Once | INTRAVENOUS | Status: AC | PRN
  Administered 2022-01-01: 8 mL via INTRAVENOUS

## 2022-01-01 MED ORDER — PROPOFOL 10 MG/ML IV BOLUS
INTRAVENOUS | Status: DC | PRN
Start: 2022-01-01 — End: 2022-01-01
  Administered 2022-01-01: 150 mg via INTRAVENOUS

## 2022-01-01 MED ORDER — FENTANYL CITRATE (PF) 100 MCG/2ML IJ SOLN: 25.0000 ug | INTRAMUSCULAR | Status: DC | PRN

## 2022-01-01 MED ORDER — LACTATED RINGERS IV SOLN: INTRAVENOUS | Status: DC

## 2022-01-01 NOTE — Care Management Obs Status (Signed)
[facility] NOTIFICATION   Patient Details  Name: Genise Strack MRN: [id number] Date of Birth: Dec 04, 1946   Medicare Observation Status Notification Given:  Yes    Carles Collet, RN 01/01/2022, 2:57 PM

## 2022-01-01 NOTE — Anesthesia Procedure Notes (Signed)
Procedure Name: LMA Insertion Date/Time: 01/01/2022 9:00 AM Performed by: Lavell Luster, CRNA Pre-anesthesia Checklist: Patient identified, Emergency Drugs available, Suction available, Patient being monitored and Timeout performed Patient Re-evaluated:Patient Re-evaluated prior to induction Oxygen Delivery Method: Circle system utilized Preoxygenation: Pre-oxygenation with 100% oxygen Induction Type: IV induction Ventilation: Mask ventilation without difficulty LMA: LMA flexible inserted LMA Size: 4.0 Placement Confirmation: breath sounds checked- equal and bilateral, positive ETCO2 and ETT inserted through vocal cords under direct vision Tube secured with: Tape Dental Injury: Teeth and Oropharynx as per pre-operative assessment

## 2022-01-01 NOTE — Progress Notes (Signed)
Pt off the floor this morning, from reviewing EMR it looks like she's getting her MRI, but the images are not up in PACS yet, will follow up on them and update recs accordingly.

## 2022-01-01 NOTE — Progress Notes (Signed)
PROGRESS NOTE  Isabel Gibbs  ATF:[id number] DOB: [birth year] DOA: 12/30/2021 PCP: Karleen Hampshire., MD   Brief Narrative: Isabel Gibbs is a 76 y.o. female with a history of HTN, prediabetes who presented to the ED 12/30/2021 with hip pain after a fall at home. In the ED she was afebrile with negative CXR, XR of pelvis and bilateral hips and right scapula. CT of the pelvis negative. Head CT performed with possible head trauma was concerning for left inferior frontal lobe lesion with surrounding vasogenic edema and CT cervical spine notable for lytic lesion along the base of the dens.  MRI was attempted with Ativan but the patient was unable to tolerate the study despite this.  Neurosurgery was consulted by the ED physician, indicated that MRI will be needed to formulate plan, and recommended admission for MRI under sedation.  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  - Neurosurgery consulted. Awaiting recommendations following MRI.   Fall at home, right hip pain: Negative radiographic survey.  - Continue PT/OT, fall precautions. No weight-bearing restrictions.   Sinus rhythm with PACs: Computer interpretation of atrial fibrillation is not correct, has no history of this. ECGs personally reviewed by myself as well as cardiology, Dr. Julianne Handler with concordant conclusion.   HTN:  - Continue ARB  Prediabetes:  - Check HbA1c  History of trigeminal neuralgia: On right, symptoms improved s/p microdecompression.  - Continue gabapentin, tramadol. No hx seizures or clinical appearance of seizures.   DVT prophylaxis: SCDs Code Status: Full Family Communication: Son at bedside Disposition Plan:  Status is: Observation depending on neurosurgery recommendations  Consultants:   Neurosurgery Cardiology, Dr. Julianne Handler  Procedures:  None  Antimicrobials: None   Subjective: Feeling aching global headache that began gradually this morning improved with tylenol. Hungry for breakfast (was NPO for anesthesia for MRI this AM), without nausea or vomiting. No changes in vision or new numbness or weakness.   Objective: Vitals:   01/01/22 0345 01/01/22 0958 01/01/22 1013 01/01/22 1028  BP: (!) 113/43 (!) 123/57 (!) 125/48 (!) 118/49  Pulse: 63 71 63 69  Resp: 17 14 16 15   Temp: 97.7 F (36.5 C) 98.7 F (37.1 C)  98.8 F (37.1 C)  TempSrc: Axillary     SpO2: 95% 97% 95% 95%  Weight:      Height:        Intake/Output Summary (Last 24 hours) at 01/01/2022 1453 Last data filed at 01/01/2022 1001 Gross per 24 hour  Intake 1297.5 ml  Output --  Net 1297.5 ml   Filed Weights   12/30/21 1645  Weight: 83.5 kg    Gen: 76 y.o. female in no distress  Pulm: Non-labored breathing room air. Clear to auscultation bilaterally.  CV: Regular with rare premature beat. Telemetry reveals sinus with infrequent PACs. No murmur, rub, or gallop. No JVD, no pedal edema. GI: Abdomen soft, non-tender, non-distended, with normoactive bowel sounds. No organomegaly or masses felt. Ext: Warm, no deformities Skin: No rashes, lesions or ulcers on visualized skin Neuro: Alert and oriented. Left eye ptosis which is chronic, otherwise no CN deficits, extremities neurovascularly intact without other focal neurological deficits. Psych: Judgement and insight appear normal. Mood & affect appropriate.   Data Reviewed: I have personally reviewed following labs and imaging studies  CBC: Recent  Labs  Lab 12/30/21 1732 12/31/21 0725  WBC 11.7* 7.1  NEUTROABS 10.2*  --   HGB 13.6 11.2*  HCT 43.6 35.4*  MCV 89.0 89.2  PLT 265 627   Basic Metabolic Panel: Recent Labs  Lab 12/30/21 1732 12/31/21 0725  NA 136 137  K 3.6 3.9  CL 102 108  CO2 24 23  GLUCOSE 152* 104*  BUN 13 11   CREATININE 0.82 0.68  CALCIUM 8.6* 7.1*  MG  --  1.6*   GFR: Estimated Creatinine Clearance: 65.5 mL/min (by C-G formula based on SCr of 0.68 mg/dL). Liver Function Tests: Recent Labs  Lab 12/30/21 1732  AST 31  ALT 38  ALKPHOS 59  BILITOT 1.1  PROT 6.3*  ALBUMIN 3.6   No results for input(s): LIPASE, AMYLASE in the last 168 hours. No results for input(s): AMMONIA in the last 168 hours. Coagulation Profile: No results for input(s): INR, PROTIME in the last 168 hours. Cardiac Enzymes: Recent Labs  Lab 12/30/21 1732  CKTOTAL 105   BNP (last 3 results) No results for input(s): PROBNP in the last 8760 hours. HbA1C: No results for input(s): HGBA1C in the last 72 hours. CBG: Recent Labs  Lab 12/30/21 1741  GLUCAP 148*   Lipid Profile: No results for input(s): CHOL, HDL, LDLCALC, TRIG, CHOLHDL, LDLDIRECT in the last 72 hours. Thyroid Function Tests: Recent Labs    12/31/21 0726  TSH 0.871   Anemia Panel: No results for input(s): VITAMINB12, FOLATE, FERRITIN, TIBC, IRON, RETICCTPCT in the last 72 hours. Urine analysis: No results found for: COLORURINE, APPEARANCEUR, LABSPEC, PHURINE, GLUCOSEU, HGBUR, BILIRUBINUR, KETONESUR, PROTEINUR, UROBILINOGEN, NITRITE, LEUKOCYTESUR Recent Results (from the past 240 hour(s))  Resp Panel by RT-PCR (Flu A&B, Covid) Nasopharyngeal Swab     Status: None   Collection Time: 12/30/21  5:28 PM   Specimen: Nasopharyngeal Swab; Nasopharyngeal(NP) swabs in vial transport medium  Result Value Ref Range Status   SARS Coronavirus 2 by RT PCR NEGATIVE NEGATIVE Final    Comment: (NOTE) SARS-CoV-2 target nucleic acids are NOT DETECTED.  The SARS-CoV-2 RNA is generally detectable in upper respiratory specimens during the acute phase of infection. The lowest concentration of SARS-CoV-2 viral copies this assay can detect is 138 copies/mL. A negative result does not preclude SARS-Cov-2 infection and should not be used as the sole basis for  treatment or other patient management decisions. A negative result may occur with  improper specimen collection/handling, submission of specimen other than nasopharyngeal swab, presence of viral mutation(s) within the areas targeted by this assay, and inadequate number of viral copies(<138 copies/mL). A negative result must be combined with clinical observations, patient history, and epidemiological information. The expected result is Negative.  Fact Sheet for Patients:  EntrepreneurPulse.com.au  Fact Sheet for Healthcare Providers:  IncredibleEmployment.be  This test is no t yet approved or cleared by the [place] FDA and  has been authorized for detection and/or diagnosis of SARS-CoV-2 by FDA under an Emergency Use Authorization (EUA). This EUA will remain  in effect (meaning this test can be used) for the duration of the COVID-19 declaration under Section 564(b)(1) of the Act, 21 U.S.C.section 360bbb-3(b)(1), unless the authorization is terminated  or revoked sooner.       Influenza A by PCR NEGATIVE NEGATIVE Final   Influenza B by PCR NEGATIVE NEGATIVE Final    Comment: (NOTE) The Xpert Xpress SARS-CoV-2/FLU/RSV plus assay is intended as an aid in the diagnosis of influenza from Nasopharyngeal swab specimens and should not  be used as a sole basis for treatment. Nasal washings and aspirates are unacceptable for Xpert Xpress SARS-CoV-2/FLU/RSV testing.  Fact Sheet for Patients: EntrepreneurPulse.com.au  Fact Sheet for Healthcare Providers: IncredibleEmployment.be  This test is not yet approved or cleared by the [place] FDA and has been authorized for detection and/or diagnosis of SARS-CoV-2 by FDA under an Emergency Use Authorization (EUA). This EUA will remain in effect (meaning this test can be used) for the duration of the COVID-19 declaration under Section 564(b)(1) of the Act, 21  U.S.C. section 360bbb-3(b)(1), unless the authorization is terminated or revoked.  Performed at [facility], [place] [address], [place], [place] [address]       Radiology Studies: DG Chest 2 View  Result Date: 12/30/2021 CLINICAL DATA:  Fall with back pain. EXAM: CHEST - 2 VIEW COMPARISON:  Chest x-ray 02/23/2017. FINDINGS: The heart size and mediastinal contours are within normal limits. Both lungs are clear. The visualized skeletal structures are unremarkable. There are surgical clips in the upper abdomen. IMPRESSION: No active cardiopulmonary disease. Electronically Signed   By: Ronney Asters M.D.   On: 12/30/2021 19:10   DG Scapula Right  Result Date: 12/30/2021 CLINICAL DATA:  fall, back/hip pain EXAM: RIGHT SCAPULA - 2+ VIEWS COMPARISON:  None. FINDINGS: There is no evidence of fracture or other focal bone lesions. Degenerative changes of the right acromioclavicular joint. Soft tissues are unremarkable. IMPRESSION: Negative. Electronically Signed   By: Iven Finn M.D.   On: 12/30/2021 19:08   CT Head Wo Contrast  Result Date: 12/30/2021 CLINICAL DATA:  Status post fall EXAM: CT HEAD WITHOUT CONTRAST CT CERVICAL SPINE WITHOUT CONTRAST TECHNIQUE: Multidetector CT imaging of the head and cervical spine was performed following the standard protocol without intravenous contrast. Multiplanar CT image reconstructions of the cervical spine were also generated. COMPARISON:  MRI head 07/13/2018 FINDINGS: CT HEAD FINDINGS BRAIN: BRAIN Patchy and confluent areas of decreased attenuation are noted throughout the deep and periventricular white matter of the cerebral hemispheres bilaterally, compatible with chronic microvascular ischemic disease. No evidence of large-territorial acute infarction. No parenchymal hemorrhage. Suggestion of a 1.9 x 1.2 x 2cm left inferior frontal lobe lesion with surrounding vasogenic edema. No extra-axial collection. No mass effect or midline shift. No hydrocephalus.  Basilar cisterns are patent. Vascular: No hyperdense vessel. Skull: No acute fracture or focal lesion. Sinuses/Orbits: Paranasal sinuses and mastoid air cells are clear. The orbits are unremarkable. Other: None. CT CERVICAL SPINE FINDINGS Alignment: Normal. Skull base and vertebrae: Pannus at the C1-C2 level with associated erosive changes of the skull base/anterior foramen magnum. Multilevel degenerative changes of the spine. Lytic lesion along the base of the dens of unclear etiology. No acute fracture. No aggressive appearing focal osseous lesion or focal pathologic process. Soft tissues and spinal canal: No prevertebral fluid or swelling. No visible canal hematoma. Upper chest: Unremarkable. Other: None. IMPRESSION: 1. In 1.9 x 1.2 x 2 cm left inferior frontal lobe lesion with surrounding vasogenic edema. Recommend MRI brain with and without contrast for further evaluation. 2. No acute displaced fracture or traumatic listhesis of the cervical spine. 3. Lytic lesion along the base of the dens of unclear etiology. Electronically Signed   By: Iven Finn M.D.   On: 12/30/2021 20:22   CT Cervical Spine Wo Contrast  Result Date: 12/30/2021 CLINICAL DATA:  Status post fall EXAM: CT HEAD WITHOUT CONTRAST CT CERVICAL SPINE WITHOUT CONTRAST TECHNIQUE: Multidetector CT imaging of the head and cervical spine was performed  following the standard protocol without intravenous contrast. Multiplanar CT image reconstructions of the cervical spine were also generated. COMPARISON:  MRI head 07/13/2018 FINDINGS: CT HEAD FINDINGS BRAIN: BRAIN Patchy and confluent areas of decreased attenuation are noted throughout the deep and periventricular white matter of the cerebral hemispheres bilaterally, compatible with chronic microvascular ischemic disease. No evidence of large-territorial acute infarction. No parenchymal hemorrhage. Suggestion of a 1.9 x 1.2 x 2cm left inferior frontal lobe lesion with surrounding vasogenic edema.  No extra-axial collection. No mass effect or midline shift. No hydrocephalus. Basilar cisterns are patent. Vascular: No hyperdense vessel. Skull: No acute fracture or focal lesion. Sinuses/Orbits: Paranasal sinuses and mastoid air cells are clear. The orbits are unremarkable. Other: None. CT CERVICAL SPINE FINDINGS Alignment: Normal. Skull base and vertebrae: Pannus at the C1-C2 level with associated erosive changes of the skull base/anterior foramen magnum. Multilevel degenerative changes of the spine. Lytic lesion along the base of the dens of unclear etiology. No acute fracture. No aggressive appearing focal osseous lesion or focal pathologic process. Soft tissues and spinal canal: No prevertebral fluid or swelling. No visible canal hematoma. Upper chest: Unremarkable. Other: None. IMPRESSION: 1. In 1.9 x 1.2 x 2 cm left inferior frontal lobe lesion with surrounding vasogenic edema. Recommend MRI brain with and without contrast for further evaluation. 2. No acute displaced fracture or traumatic listhesis of the cervical spine. 3. Lytic lesion along the base of the dens of unclear etiology. Electronically Signed   By: Iven Finn M.D.   On: 12/30/2021 20:22   CT PELVIS WO CONTRAST  Result Date: 12/30/2021 CLINICAL DATA:  Hip trauma, fracture suspected, xray done 3 status post fall EXAM: CT PELVIS WITHOUT CONTRAST TECHNIQUE: Multidetector CT imaging of the pelvis was performed following the standard protocol without intravenous contrast. COMPARISON:  X-ray bilateral hips 12/30/2021 FINDINGS: Urinary Tract:  No abnormality visualized. Bowel: Scattered colonic diverticulosis. Otherwise unremarkable visualized pelvic bowel loops. Vascular/Lymphatic: Atherosclerotic plaque. No pathologically enlarged lymph nodes. No significant vascular abnormality seen. Reproductive:  No mass or other significant abnormality Other: No intraperitoneal free fluid. No intraperitoneal free gas. No organized fluid collection.  Musculoskeletal: No large hematoma formation. Mild subcutaneus soft tissue edema along the left hip. No acute displaced fracture of the hips or pelvis. No pelvic bone diastasis. Degenerative changes of visualized lower lumbar spine. Lucency of the left acetabulum (6:50 2-56) likely represents a nutrient vessel. At least mild degenerative changes of bilateral hips. Old healed coccyx fracture with fusion to the S5 vertebral body. IMPRESSION: 1. Negative for acute traumatic injury. 2.  Aortic Atherosclerosis (ICD10-I70.0). Electronically Signed   By: Iven Finn M.D.   On: 12/30/2021 21:43   MR BRAIN WO CONTRAST  Result Date: 12/31/2021 CLINICAL DATA:  Follow-up examination for brain lesion. EXAM: MRI HEAD WITHOUT CONTRAST TECHNIQUE: Multiplanar, multiecho pulse sequences of the brain and surrounding structures were obtained without intravenous contrast. COMPARISON:  Prior MRI from 12/30/2021. FINDINGS: Examination was performed as a re-attempt at obtaining postcontrast images of the previously identified left frontal lobe abnormality. Unfortunately, the patient was unable to tolerate the exam, with no postcontrast imaging performed. Axial SWI and T1 weighted sequences only were performed. Additionally, the provided images are moderately degraded by motion artifact. Given the technical limitations, no new findings are seen within the brain. The left frontal lobe lesion is again not well seen and incompletely assessed. IMPRESSION: Technically limited exam due to the patient's inability to tolerate the exam. Unfortunately, postcontrast images of the brain were again  unable to be obtained. No new findings identified. Electronically Signed   By: Jeannine Boga M.D.   On: 12/31/2021 03:16   MR BRAIN WO CONTRAST  Result Date: 12/30/2021 CLINICAL DATA:  Follow-up examination for abnormal CT, possible intracranial mass. EXAM: MRI HEAD WITHOUT CONTRAST TECHNIQUE: Multiplanar, multiecho pulse sequences of the  brain and surrounding structures were obtained without intravenous contrast. COMPARISON:  Prior CT from earlier the same day as well as previous MRI from 07/13/2018. FINDINGS: Brain: Examination technically limited as the patient was unable to tolerate the full length of the exam, postcontrast sequences were not performed. Additionally, images provided are moderately to severely intermittently degraded by motion artifact. Diffuse prominence of the CSF containing spaces compatible generalized cerebral atrophy. Patchy and confluent T2/FLAIR hyperintensity involving the periventricular deep white matter both cerebral hemispheres most consistent with chronic small vessel ischemic disease, moderate in nature. Small remote right cerebellar infarct noted. There is suggestion of a focal intraparenchymal abnormality centered at the anterior/inferior left frontal lobe at the left gyrus rectus, corresponding with abnormality on prior CT. This measures approximately 2.3 x 1.3 cm (series 16, image 16). Surrounding T2/FLAIR signal abnormality consistent with vasogenic edema. Edema extends along the genu of the corpus callosum, crossing the midline to involve the contralateral right frontal lobe (series 17, image 17). No definite intrinsic T1 hyperintensity. No visible associated susceptibility artifact, although evaluation markedly limited by motion. Finding is indeterminate, and could reflect an intraparenchymal mass, possibly a primary CNS neoplasm or solitary intracranial metastasis. A possible subacute hematoma is also considered, although evaluation limited given artifact on this exam. No other visible mass lesion, mass effect, or midline shift. No hydrocephalus or extra-axial fluid collection. No other foci of diffusion abnormality to suggest acute or subacute ischemia. Gray-white matter differentiation otherwise maintained. Pituitary gland suprasellar region normal. Midline structures intact. Vascular: Major intracranial  vascular flow voids are grossly maintained at the skull base. Skull and upper cervical spine: Prominent degenerative thickening/pannus formation noted at the tectorial membrane and dens. Craniocervical junction otherwise unremarkable. Bone marrow signal intensity within normal limits. No focal marrow replacing lesion. Hyperostosis frontalis interna noted. Prior right retrosigmoid craniotomy noted. No acute scalp soft tissue abnormality. Sinuses/Orbits: Patient status post bilateral ocular lens replacement. Scattered mucosal thickening noted within the ethmoidal air cells. Paranasal sinuses are otherwise clear. No mastoid effusion. Other: None. IMPRESSION: 1. Technically limited exam due to motion artifact and the patient's inability to tolerate the full length of the study. Post-contrast sequences were not performed. 2. Approximate 2.3 x 1.3 cm intraparenchymal abnormality centered at the anterior/inferior left frontal lobe, corresponding with abnormality on prior CT. Surrounding vasogenic edema without midline shift. Finding is indeterminate, and could reflect an intraparenchymal mass, possibly a primary CNS neoplasm or solitary intracranial metastasis. A possible evolving subacute hematoma is also considered. Further assessment with postcontrast imaging as the patient is able to tolerate is recommended. Additionally, a short interval follow-up MRI to evaluate for evolutionary changes may be helpful as well. 3. No other acute intracranial abnormality. 4. Underlying atrophy with moderate chronic small vessel ischemic disease. Electronically Signed   By: Jeannine Boga M.D.   On: 12/30/2021 23:25   MR BRAIN W WO CONTRAST  Result Date: 01/01/2022 CLINICAL DATA:  Left frontal mass EXAM: MRI HEAD WITHOUT AND WITH CONTRAST TECHNIQUE: Multiplanar, multiecho pulse sequences of the brain and surrounding structures were obtained without and with intravenous contrast. CONTRAST:  79mL GADAVIST GADOBUTROL 1 MMOL/ML IV  SOLN COMPARISON:  Brain MRI 12/30/2021,  CT head 12/30/2021, brain MRI 07/13/2018 FINDINGS: Brain: There is masslike FLAIR signal abnormality in the left anterior frontal lobe. There is homogeneous enhancement within the area of signal abnormality measuring 1.8 cm AP by 1.4 cm TV by 1.1 cm cc. There is faint diffusion restriction within this area of enhancement. FLAIR signal abnormality surrounding the enhancement is seen in the adjacent left frontal lobe subcortical white matter as well as crossing the midline via the genu of the corpus callosum into the right anterior frontal lobe. No other convincing enhancement is seen within the areas of FLAIR signal abnormality. There is no SWI signal dropout to suggest hemorrhage. There is no cystic change. Background of mild parenchymal volume loss and chronic white matter microangiopathy is unchanged. Sulcal FLAIR hyperintensity overlying the occipital lobes is favored to be artifactual. There is partial effacement of the left frontal horn by the above-described mass. The ventricles are otherwise normal in size. There is no other abnormal enhancement.  There is no midline shift. Vascular: Normal flow voids. Skull and upper cervical spine: Normal marrow signal. Sinuses/Orbits: The paranasal sinuses are clear. Bilateral lens implants are in place. The globes and orbits are otherwise unremarkable. Other: None. IMPRESSION: Homogeneously enhancing lesion in the left frontal lobe with surrounding edema and/or infiltrative tumor which crosses midline via the genu of the corpus callosum described above. 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 Electronically Signed   By: Valetta Mole M.D.   On: 01/01/2022 10:06   MR CERVICAL SPINE WO CONTRAST  Result Date: 12/31/2021 CLINICAL DATA:  Initial evaluation for possible bone lesion. EXAM: MRI CERVICAL SPINE WITHOUT CONTRAST TECHNIQUE: Multiplanar, multisequence MR imaging  of the cervical spine was performed. No intravenous contrast was administered. COMPARISON:  Prior CT from 12/30/2021. FINDINGS: Alignment: Examination degraded by motion artifact. Additionally, patient was unable to tolerate the full length of the exam. No postcontrast images were performed. Vertebral bodies normally aligned with preservation of the normal cervical lordosis. No listhesis. Vertebrae: Vertebral body height maintained without acute or chronic fracture. Bone marrow signal intensity within normal limits. 6 mm T1/T2 hyperintense lesion seen at the base of the dens, consistent with a small benign hemangioma. This corresponds with lesion noted on prior CT. No other worrisome osseous lesions seen elsewhere within the cervical spine. No abnormal marrow edema. Cord: Normal signal and morphology. Posterior Fossa, vertebral arteries, paraspinal tissues: Visualized brain and posterior fossa within normal limits. Degenerative thickening about the tectorial membrane with pannus formation noted. No significant stenosis at the craniocervical junction. Paraspinous and prevertebral soft tissues within normal limits. Normal flow voids seen within the vertebral arteries bilaterally. Few small thyroid nodules noted, largest of which measures 12 mm on the right, of doubtful significance given size and patient age, no follow-up imaging recommended (ref: J Am Coll Radiol. 2015 Feb;12(2): 143-50). Disc levels: C2-C3: Shallow left eccentric disc osteophyte complex mildly flattens the ventral thecal sac. Minimal flattening of the left ventral cord without cord signal changes. Mild ligamentum flavum hypertrophy. Resultant mild spinal stenosis. Foramina remain patent. C3-C4: Small central disc protrusion indents the ventral thecal sac (series 13, image 22). No more than mild spinal stenosis without frank cord impingement. Left-sided uncovertebral and facet hypertrophy with resultant mild to moderate left C4 foraminal stenosis.  Right neural foramen remains patent. C4-C5: Right paracentral disc protrusion indents the right ventral thecal sac (series 13, image 27). Minimal flattening of the ventral cord without cord signal changes. Mild spinal  stenosis. Superimposed uncovertebral and facet hypertrophy with resultant moderate left C5 foraminal narrowing. Right neural foramina remains patent. C5-C6: Small central disc protrusion indents the ventral thecal sac. No significant spinal stenosis or cord deformity. Superimposed mild facet hypertrophy. Foramina remain patent. C6-C7: Anterior endplate spurring without significant disc bulge. Mild facet hypertrophy. No stenosis. C7-T1:  Negative interspace.  Mild facet hypertrophy.  No stenosis. Visualized upper thoracic spine demonstrates no significant finding. IMPRESSION: 1. 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 No other worrisome osseous lesions within the cervical spine. 2. 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 3. Multifactorial degenerative changes with resultant mild to moderate left C4 and C5 foraminal stenosis. Electronically Signed   By: Jeannine Boga M.D.   On: 12/31/2021 03:10   DG Hip Unilat W or Wo Pelvis 2-3 Views Left  Result Date: 12/30/2021 CLINICAL DATA:  fall, back/hip pain EXAM: DG HIP (WITH OR WITHOUT PELVIS) 2-3V RIGHT; DG HIP (WITH OR WITHOUT PELVIS) 2-3V LEFT COMPARISON:  None. FINDINGS: Markedly limited evaluation due to overlapping osseous structures and overlying soft tissues. There is no evidence of hip fracture or dislocation bilaterally. No acute displaced fracture or diastasis of the bones of the pelvis. Visualized lower lumbar spine demonstrates degenerative changes. Limited evaluation of the sacrum due to overlying bowel. There is no evidence of severe arthropathy or other focal bone abnormality. IMPRESSION: Negative for acute traumatic injury.  Electronically Signed   By: Iven Finn M.D.   On: 12/30/2021 19:09   DG Hip Unilat W or Wo Pelvis 2-3 Views Right  Result Date: 12/30/2021 CLINICAL DATA:  fall, back/hip pain EXAM: DG HIP (WITH OR WITHOUT PELVIS) 2-3V RIGHT; DG HIP (WITH OR WITHOUT PELVIS) 2-3V LEFT COMPARISON:  None. FINDINGS: Markedly limited evaluation due to overlapping osseous structures and overlying soft tissues. There is no evidence of hip fracture or dislocation bilaterally. No acute displaced fracture or diastasis of the bones of the pelvis. Visualized lower lumbar spine demonstrates degenerative changes. Limited evaluation of the sacrum due to overlying bowel. There is no evidence of severe arthropathy or other focal bone abnormality. IMPRESSION: Negative for acute traumatic injury. Electronically Signed   By: Iven Finn M.D.   On: 12/30/2021 19:09    Scheduled Meds:  chlorhexidine       gabapentin  900 mg Oral BID   irbesartan  150 mg Oral QHS   lidocaine  1 patch Transdermal Q24H   sodium chloride flush  3 mL Intravenous Q12H   traMADol  50 mg Oral BID   Continuous Infusions:   LOS: 0 days   Time spent: 25 minutes.  Patrecia Pour, MD [facility] [email or web address] 01/01/2022, 2:53 PM

## 2022-01-01 NOTE — Anesthesia Postprocedure Evaluation (Signed)
Anesthesia Post Note  Patient: Isabel Gibbs  Procedure(s) Performed: MRI of Brain with and without contrast     Patient location during evaluation: PACU Anesthesia Type: General Level of consciousness: awake Pain management: pain level controlled Vital Signs Assessment: post-procedure vital signs reviewed and stable Respiratory status: spontaneous breathing Cardiovascular status: stable Postop Assessment: no apparent nausea or vomiting Anesthetic complications: no   No notable events documented.  Last Vitals:  Vitals:   01/01/22 1013 01/01/22 1028  BP: (!) 125/48 (!) 118/49  Pulse: 63 69  Resp: 16 15  Temp:  37.1 C  SpO2: 95% 95%    Last Pain:  Vitals:   01/01/22 1245  TempSrc:   PainSc: 3                  Trevel Dillenbeck

## 2022-01-01 NOTE — Progress Notes (Signed)
°  Transition of Care [facility]) Screening Note   Patient Details  Name: Isabel Gibbs Date of Birth: [birth year]   Transition of Care [facility]) CM/SW Contact:    Pollie Friar, RN Phone Number: 01/01/2022, 1:40 PM    Transition of Care Department [facility]) has reviewed patient and no TOC needs have been identified at this time. We will continue to monitor patient advancement through interdisciplinary progression rounds. If new patient transition needs arise, please place a TOC consult.

## 2022-01-01 NOTE — Transfer of Care (Signed)
Immediate Anesthesia Transfer of Care Note  Patient: Isabel Gibbs  Procedure(s) Performed: MRI of Brain with and without contrast  Patient Location: PACU  Anesthesia Type:General  Level of Consciousness: awake, alert  and oriented  Airway & Oxygen Therapy: Patient Spontanous Breathing  Post-op Assessment: Post -op Vital signs reviewed and stable  Post vital signs: stable  Last Vitals:  Vitals Value Taken Time  BP 123/57 01/01/22 0958  Temp 37.1 C 01/01/22 0958  Pulse 66 01/01/22 1002  Resp 15 01/01/22 1002  SpO2 96 % 01/01/22 1002  Vitals shown include unvalidated device data.  Last Pain:  Vitals:   01/01/22 0958  TempSrc:   PainSc: 0-No pain         Complications: No notable events documented.

## 2022-01-01 NOTE — Progress Notes (Signed)
MRI reviewed, does show enhancing mass in the L F region c/f primary neoplasm, will need a stereotactic biopsy. Added on for OR tomorrow, placed orders for NPO after midnight, will need a brainlab CT for registration / navigation, which I've also ordered. I discussed this with the patient but will go over everything again in further detail tonight or tomorrow morning.

## 2022-01-01 NOTE — Plan of Care (Signed)
  Problem: Pain Managment: Goal: General experience of comfort will improve Outcome: Progressing   Problem: Safety: Goal: Ability to remain free from injury will improve Outcome: Progressing   Problem: Skin Integrity: Goal: Risk for impaired skin integrity will decrease Outcome: Progressing   

## 2022-01-01 NOTE — Anesthesia Preprocedure Evaluation (Addendum)
Anesthesia Evaluation  Patient identified by MRN, date of birth, ID band Patient awake    Reviewed: Allergy & Precautions, NPO status , Patient's Chart, lab work & pertinent test results  Airway Mallampati: II  TM Distance: >3 FB     Dental   Pulmonary neg pulmonary ROS, former smoker,    breath sounds clear to auscultation       Cardiovascular hypertension,  Rhythm:Regular Rate:Normal     Neuro/Psych  Neuromuscular disease    GI/Hepatic negative GI ROS, Neg liver ROS,   Endo/Other  negative endocrine ROS  Renal/GU negative Renal ROS     Musculoskeletal   Abdominal   Peds  Hematology   Anesthesia Other Findings   Reproductive/Obstetrics                             Anesthesia Physical Anesthesia Plan  ASA: 3  Anesthesia Plan: General   Post-op Pain Management:    Induction:   PONV Risk Score and Plan: Ondansetron and Treatment may vary due to age or medical condition  Airway Management Planned: LMA  Additional Equipment:   Intra-op Plan:   Post-operative Plan: Extubation in OR  Informed Consent: I have reviewed the patients History and Physical, chart, labs and discussed the procedure including the risks, benefits and alternatives for the proposed anesthesia with the patient or authorized representative who has indicated his/her understanding and acceptance.     Dental advisory given  Plan Discussed with: CRNA and Anesthesiologist  Anesthesia Plan Comments:         Anesthesia Quick Evaluation

## 2022-01-02 ENCOUNTER — Inpatient Hospital Stay (HOSPITAL_COMMUNITY): Payer: Medicare Other | Admitting: Certified Registered Nurse Anesthetist

## 2022-01-02 ENCOUNTER — Other Ambulatory Visit: Payer: Self-pay | Admitting: Radiation Therapy

## 2022-01-02 ENCOUNTER — Encounter (HOSPITAL_COMMUNITY)
Admission: EM | Disposition: A | Discharge status: Home / Self Care | Payer: Self-pay | Source: Home / Self Care | Attending: Family Medicine

## 2022-01-02 ENCOUNTER — Inpatient Hospital Stay (HOSPITAL_COMMUNITY): Payer: Medicare Other

## 2022-01-02 ENCOUNTER — Encounter (HOSPITAL_COMMUNITY): Payer: Self-pay | Admitting: Radiology

## 2022-01-02 DIAGNOSIS — Z9181 History of falling: Secondary | ICD-10-CM | POA: Diagnosis not present

## 2022-01-02 DIAGNOSIS — M25551 Pain in right hip: Secondary | ICD-10-CM | POA: Diagnosis present

## 2022-01-02 DIAGNOSIS — F40240 Claustrophobia: Secondary | ICD-10-CM | POA: Diagnosis present

## 2022-01-02 DIAGNOSIS — Z79899 Other long term (current) drug therapy: Secondary | ICD-10-CM | POA: Diagnosis not present

## 2022-01-02 DIAGNOSIS — Z888 Allergy status to other drugs, medicaments and biological substances status: Secondary | ICD-10-CM | POA: Diagnosis not present

## 2022-01-02 DIAGNOSIS — R9431 Abnormal electrocardiogram [ECG] [EKG]: Secondary | ICD-10-CM | POA: Diagnosis not present

## 2022-01-02 DIAGNOSIS — Z20822 Contact with and (suspected) exposure to covid-19: Secondary | ICD-10-CM | POA: Diagnosis present

## 2022-01-02 DIAGNOSIS — G9389 Other specified disorders of brain: Secondary | ICD-10-CM | POA: Diagnosis present

## 2022-01-02 DIAGNOSIS — M25511 Pain in right shoulder: Secondary | ICD-10-CM | POA: Diagnosis present

## 2022-01-02 DIAGNOSIS — G939 Disorder of brain, unspecified: Secondary | ICD-10-CM | POA: Diagnosis not present

## 2022-01-02 DIAGNOSIS — Z7983 Long term (current) use of bisphosphonates: Secondary | ICD-10-CM | POA: Diagnosis not present

## 2022-01-02 DIAGNOSIS — G936 Cerebral edema: Secondary | ICD-10-CM | POA: Diagnosis present

## 2022-01-02 DIAGNOSIS — I10 Essential (primary) hypertension: Secondary | ICD-10-CM | POA: Diagnosis present

## 2022-01-02 DIAGNOSIS — R7303 Prediabetes: Secondary | ICD-10-CM | POA: Diagnosis present

## 2022-01-02 DIAGNOSIS — C8519 Unspecified B-cell lymphoma, extranodal and solid organ sites: Secondary | ICD-10-CM | POA: Diagnosis not present

## 2022-01-02 DIAGNOSIS — I4891 Unspecified atrial fibrillation: Secondary | ICD-10-CM | POA: Diagnosis not present

## 2022-01-02 DIAGNOSIS — Z8249 Family history of ischemic heart disease and other diseases of the circulatory system: Secondary | ICD-10-CM | POA: Diagnosis not present

## 2022-01-02 DIAGNOSIS — Z885 Allergy status to narcotic agent status: Secondary | ICD-10-CM | POA: Diagnosis not present

## 2022-01-02 DIAGNOSIS — Z87891 Personal history of nicotine dependence: Secondary | ICD-10-CM | POA: Diagnosis not present

## 2022-01-02 DIAGNOSIS — Z833 Family history of diabetes mellitus: Secondary | ICD-10-CM | POA: Diagnosis not present

## 2022-01-02 HISTORY — PX: FRAMELESS  BIOPSY WITH BRAINLAB: SHX6879

## 2022-01-02 HISTORY — PX: APPLICATION OF CRANIAL NAVIGATION: SHX6578

## 2022-01-02 LAB — GLUCOSE, CAPILLARY
Glucose-Capillary: 107 mg/dL — ABNORMAL HIGH (ref 70–99)
Glucose-Capillary: 109 mg/dL — ABNORMAL HIGH (ref 70–99)

## 2022-01-02 IMAGING — CT CT HEAD W/O CM
4 series · 15 of 47 positions shown, 17 images · non-contrast
Comparison: [DATE].
COMPARISON: [DATE].

Addendum:
CLINICAL DATA: Brain/CNS neoplasm. Assess treatment response.
Status post stereotactic brain biopsy.

EXAM:
CT HEAD WITHOUT CONTRAST
TECHNIQUE: Contiguous axial images were obtained from the base of the skull
through the vertex without intravenous contrast.

[Series 3: head wo · axial · 0.43mm/px · z∈[-151,-31]mm · 7 of 34 slices shown, 9 images]
[im 5/34  brain]
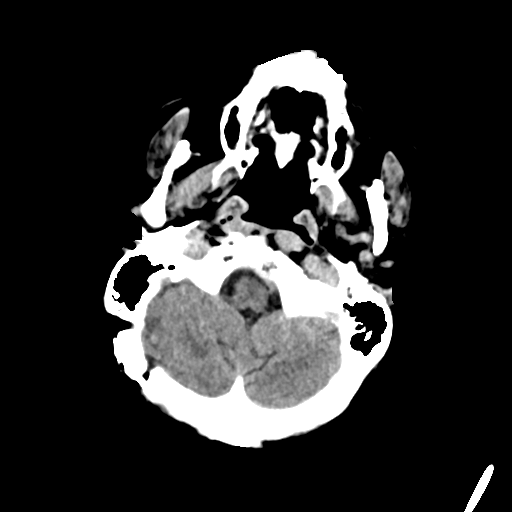
[im 5/34  bone]
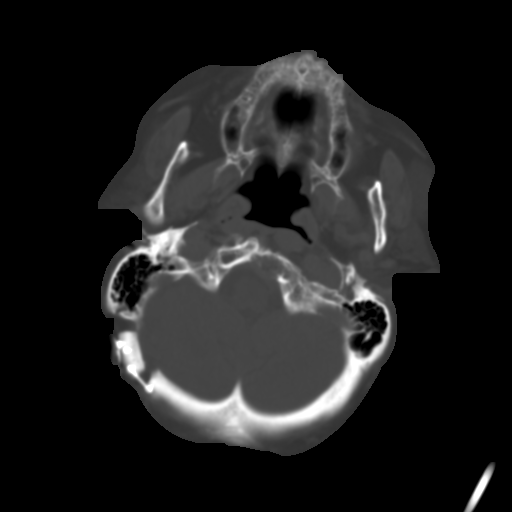
[im 9/34  brain]
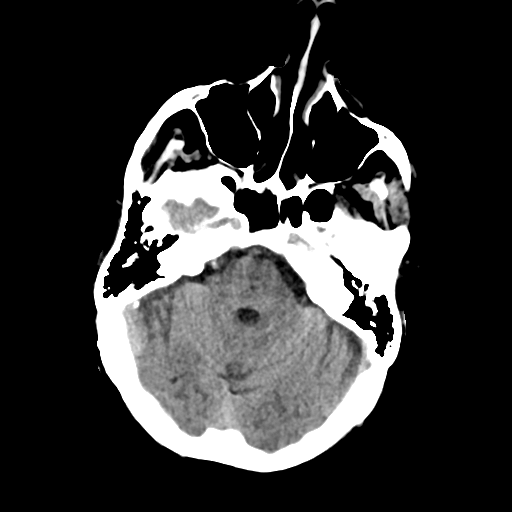
[im 13/34  brain]
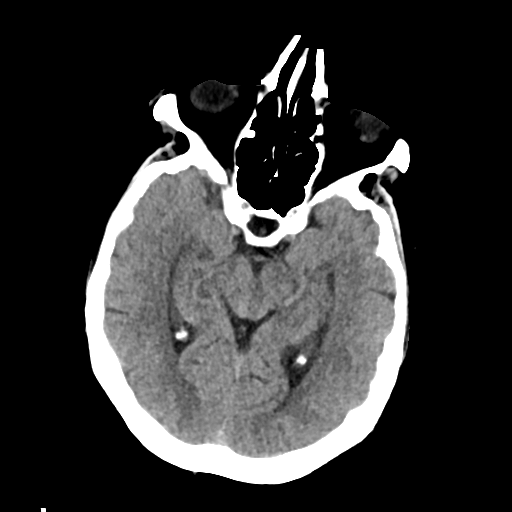
[im 17/34  brain]
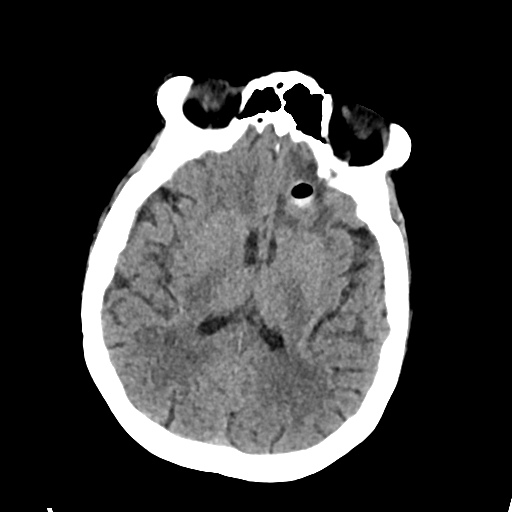
[im 21/34  brain]
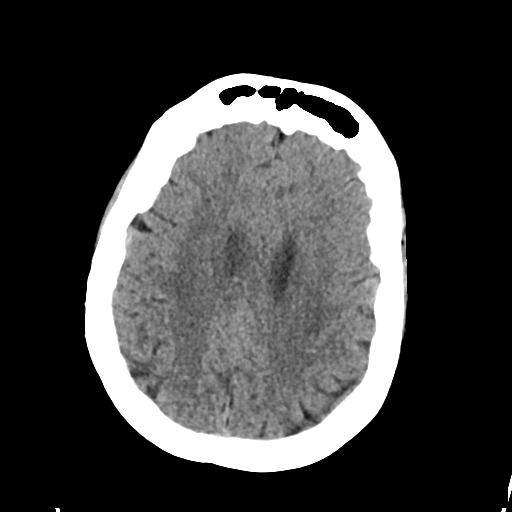
[im 21/34  bone]
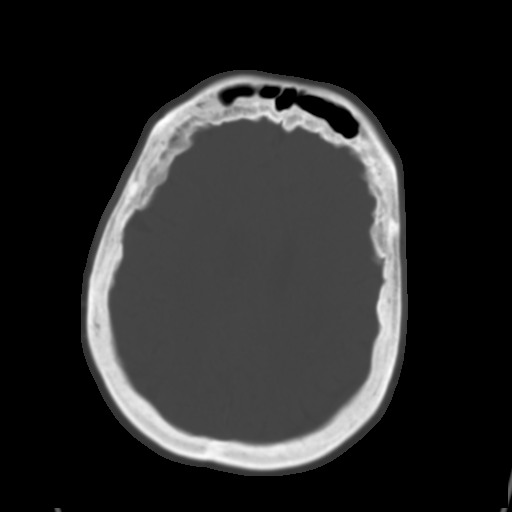
[im 25/34  brain]
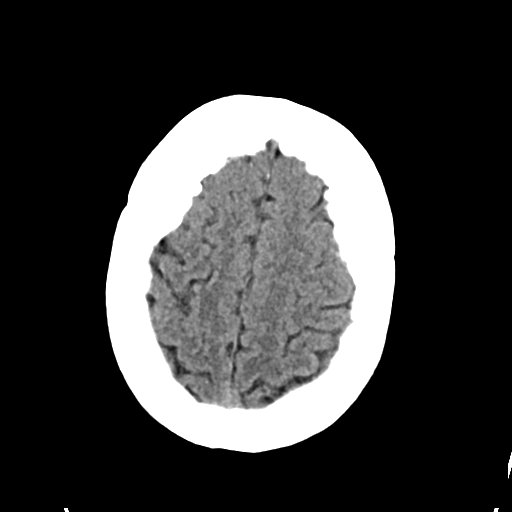
[im 29/34  brain]
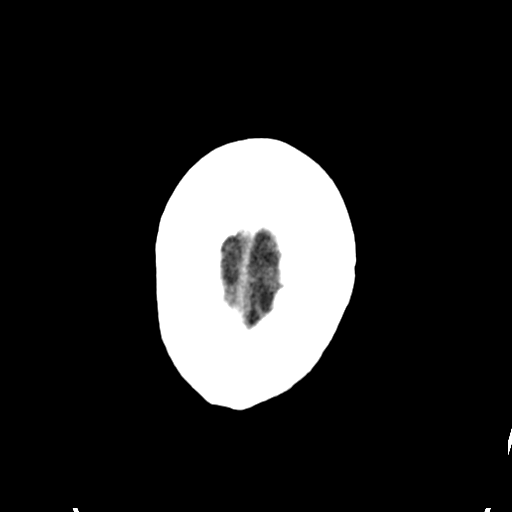

[Series 4: head bone · axial · 0.43mm/px · z∈[-155,-139]mm · 2 of 83 slices shown]
[im 9/83  bone]
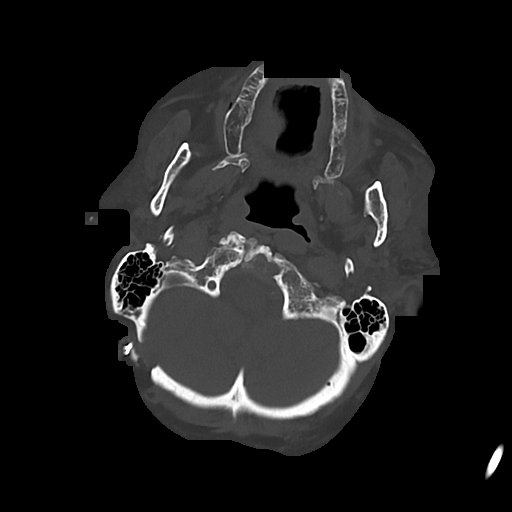
[im 17/83  bone]
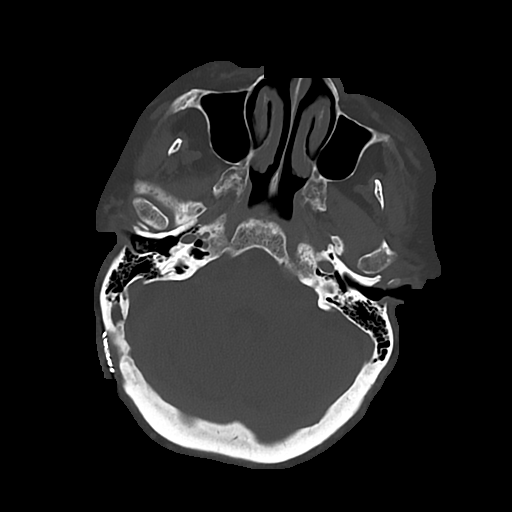

[Series 5: cor soft · coronal · 0.31mm/px · 3 of 70 slices shown]
[im 24/70  brain]
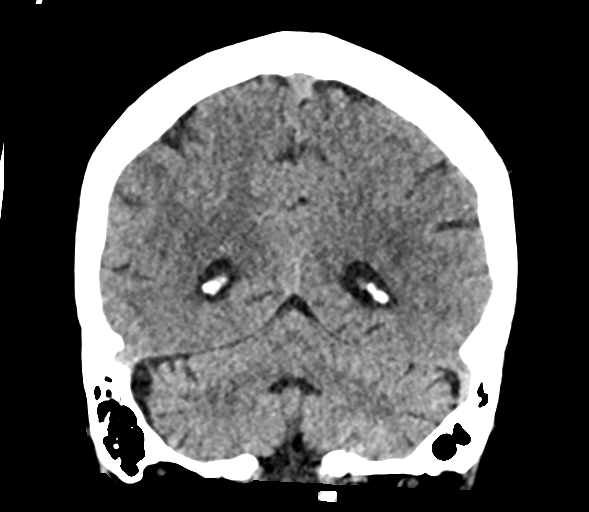
[im 31/70  brain]
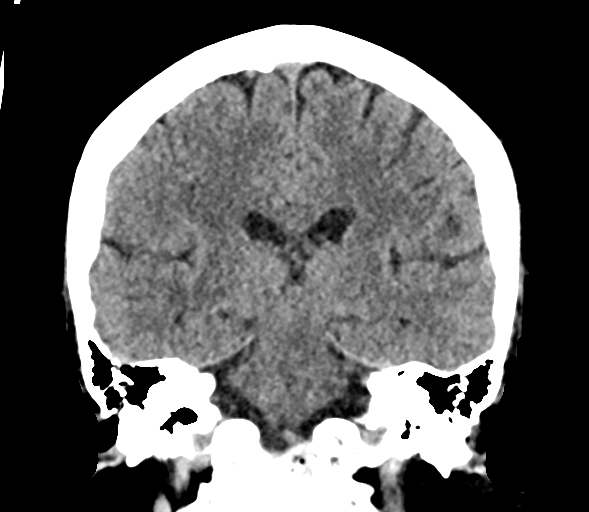
[im 39/70  brain]
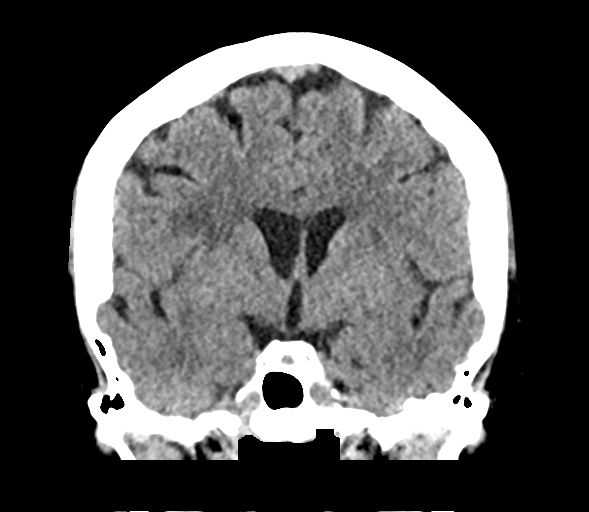

[Series 6: sag soft · sagittal · 0.31mm/px · 3 of 58 slices shown]
[im 20/58  brain]
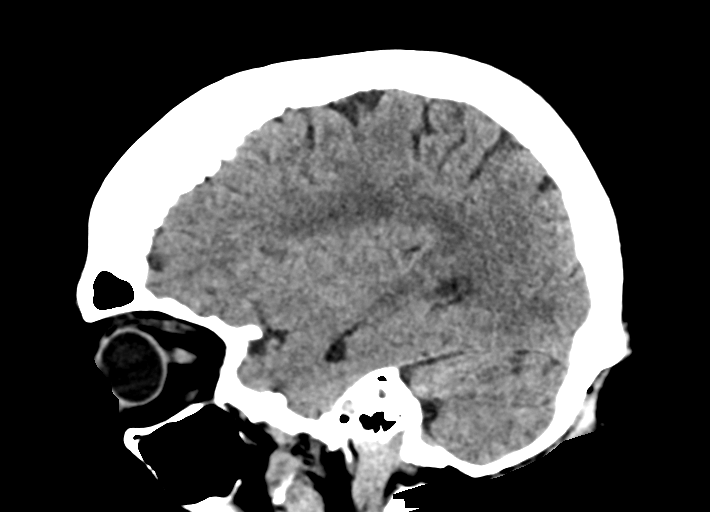
[im 29/58  brain]
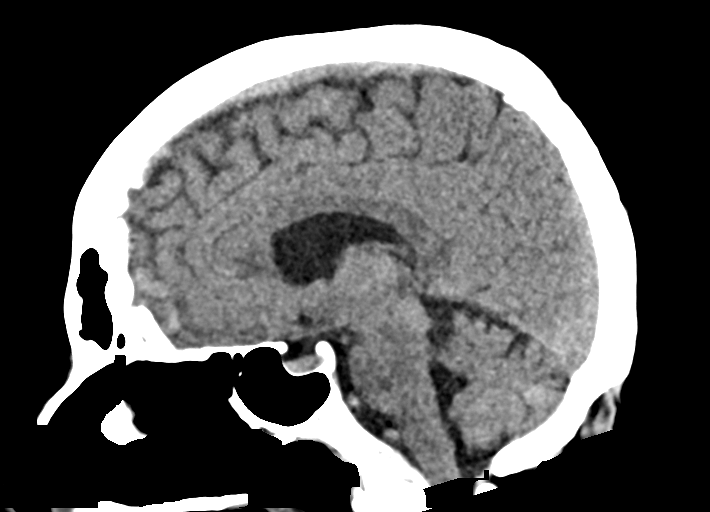
[im 39/58  brain]
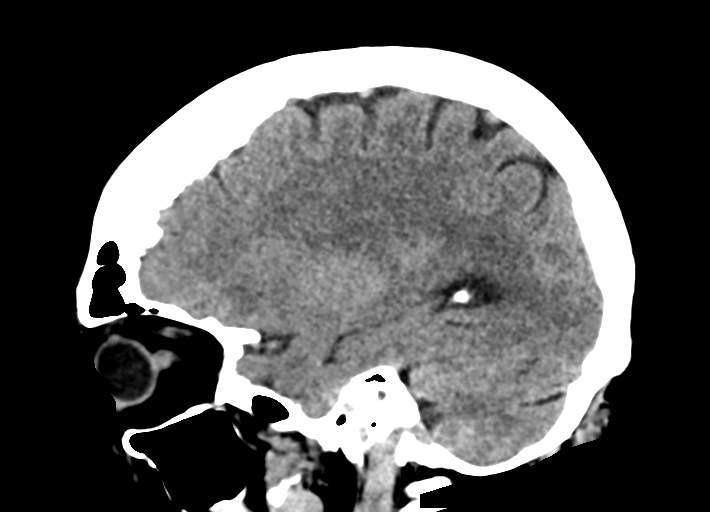

[15 of 47 positions shown; findings below may reference images not displayed]

FINDINGS: Brain: There is a mass measuring 2 cm containing small focus of
hemorrhage and air in the inferior aspect of the frontal lobe on the
left measuring 1.1 x 0.9 cm. Mild surrounding vasogenic edema is
noted resulting in mild mass effect. No significant midline shift.
There is no hydrocephalus. A stable hyperdense lesion is noted along
the tentorium on the right 1.0 x 0.6 cm.

Vascular: No hyperdense vessel or unexpected calcification.

Skull: Craniotomy changes are noted in the occipital region on the
right, unchanged from the previous exam. The calvarium is otherwise
intact.

Sinuses/Orbits: No acute finding.

Other: None.
IMPRESSION: 1. Status post stereotactic biopsy of mass in the inferior aspect of
the frontal lobe on the left. There is a small focus of hemorrhage
and air within the lesion measuring 1.1 x 0.9 cm. Surrounding
vasogenic edema is unchanged.
2. Essentially stable hyperdense lesion along the tentorium on the
right adjacent to the pons, possible meningioma.

ADDENDUM:
Findings were discussed with Dr. MORELL at [DATE] a.m.

*** End of Addendum ***
FINDINGS: Brain: There is a mass measuring 2 cm containing small focus of
hemorrhage and air in the inferior aspect of the frontal lobe on the
left measuring 1.1 x 0.9 cm. Mild surrounding vasogenic edema is
noted resulting in mild mass effect. No significant midline shift.
There is no hydrocephalus. A stable hyperdense lesion is noted along
the tentorium on the right 1.0 x 0.6 cm.

Vascular: No hyperdense vessel or unexpected calcification.

Skull: Craniotomy changes are noted in the occipital region on the
right, unchanged from the previous exam. The calvarium is otherwise
intact.

Sinuses/Orbits: No acute finding.

Other: None.
IMPRESSION: 1. Status post stereotactic biopsy of mass in the inferior aspect of
the frontal lobe on the left. There is a small focus of hemorrhage
and air within the lesion measuring 1.1 x 0.9 cm. Surrounding
vasogenic edema is unchanged.
2. Essentially stable hyperdense lesion along the tentorium on the
right adjacent to the pons, possible meningioma.

## 2022-01-02 SURGERY — FRAMELESS BIOPSY WITH BRAINLAB
Anesthesia: General | Laterality: Left

## 2022-01-02 MED ORDER — LIDOCAINE-EPINEPHRINE 1 %-1:100000 IJ SOLN
INTRAMUSCULAR | Status: DC | PRN
Start: 2022-01-02 — End: 2022-01-02
  Administered 2022-01-02: 2.5 mL

## 2022-01-02 MED ORDER — PHENYLEPHRINE 40 MCG/ML (10ML) SYRINGE FOR IV PUSH (FOR BLOOD PRESSURE SUPPORT)
PREFILLED_SYRINGE | INTRAVENOUS | Status: AC
  Filled 2022-01-02: qty 10

## 2022-01-02 MED ORDER — LIDOCAINE 2% (20 MG/ML) 5 ML SYRINGE
INTRAMUSCULAR | Status: AC
  Filled 2022-01-02: qty 5

## 2022-01-02 MED ORDER — DEXAMETHASONE SODIUM PHOSPHATE 10 MG/ML IJ SOLN
INTRAMUSCULAR | Status: DC | PRN
  Administered 2022-01-02: 10 mg via INTRAVENOUS

## 2022-01-02 MED ORDER — CHLORHEXIDINE GLUCONATE CLOTH 2 % EX PADS
6.0000 | MEDICATED_PAD | Freq: Every day | CUTANEOUS | Status: DC
  Administered 2022-01-02: 6 via TOPICAL

## 2022-01-02 MED ORDER — EPHEDRINE 5 MG/ML INJ
INTRAVENOUS | Status: AC
  Filled 2022-01-02: qty 5

## 2022-01-02 MED ORDER — OXYCODONE HCL 5 MG PO TABS: 5.0000 mg | ORAL_TABLET | ORAL | Status: DC | PRN

## 2022-01-02 MED ORDER — CHLORHEXIDINE GLUCONATE 0.12 % MT SOLN
OROMUCOSAL | Status: AC
  Filled 2022-01-02: qty 15

## 2022-01-02 MED ORDER — HYDROMORPHONE HCL 1 MG/ML IJ SOLN: 0.5000 mg | INTRAMUSCULAR | Status: DC | PRN

## 2022-01-02 MED ORDER — CEFAZOLIN SODIUM-DEXTROSE 2-3 GM-%(50ML) IV SOLR
INTRAVENOUS | Status: DC | PRN
  Administered 2022-01-02: 2 g via INTRAVENOUS

## 2022-01-02 MED ORDER — ACETAMINOPHEN 325 MG PO TABS
ORAL_TABLET | ORAL | Status: AC
  Filled 2022-01-02: qty 2

## 2022-01-02 MED ORDER — BACITRACIN ZINC 500 UNIT/GM EX OINT
TOPICAL_OINTMENT | CUTANEOUS | Status: AC
  Filled 2022-01-02: qty 28.35

## 2022-01-02 MED ORDER — THROMBIN 5000 UNITS EX SOLR
OROMUCOSAL | Status: DC | PRN
  Administered 2022-01-02: 5 mL via TOPICAL

## 2022-01-02 MED ORDER — SUGAMMADEX SODIUM 500 MG/5ML IV SOLN
INTRAVENOUS | Status: AC
  Filled 2022-01-02: qty 5

## 2022-01-02 MED ORDER — THROMBIN 5000 UNITS EX SOLR
CUTANEOUS | Status: AC
  Filled 2022-01-02: qty 5000

## 2022-01-02 MED ORDER — PROPOFOL 10 MG/ML IV BOLUS
INTRAVENOUS | Status: DC | PRN
Start: 2022-01-02 — End: 2022-01-02
  Administered 2022-01-02: 130 mg via INTRAVENOUS

## 2022-01-02 MED ORDER — FENTANYL CITRATE (PF) 250 MCG/5ML IJ SOLN
INTRAMUSCULAR | Status: AC
  Filled 2022-01-02: qty 5

## 2022-01-02 MED ORDER — DEXAMETHASONE SODIUM PHOSPHATE 10 MG/ML IJ SOLN
INTRAMUSCULAR | Status: AC
  Filled 2022-01-02: qty 1

## 2022-01-02 MED ORDER — ONDANSETRON HCL 4 MG/2ML IJ SOLN
INTRAMUSCULAR | Status: AC
  Filled 2022-01-02: qty 2

## 2022-01-02 MED ORDER — LIDOCAINE-EPINEPHRINE 1 %-1:100000 IJ SOLN
INTRAMUSCULAR | Status: AC
  Filled 2022-01-02: qty 1

## 2022-01-02 MED ORDER — LIDOCAINE 2% (20 MG/ML) 5 ML SYRINGE
INTRAMUSCULAR | Status: DC | PRN
Start: 2022-01-02 — End: 2022-01-02
  Administered 2022-01-02: 60 mg via INTRAVENOUS

## 2022-01-02 MED ORDER — ORAL CARE MOUTH RINSE: 15.0000 mL | Freq: Once | OROMUCOSAL | Status: DC

## 2022-01-02 MED ORDER — ONDANSETRON HCL 4 MG/2ML IJ SOLN
INTRAMUSCULAR | Status: DC | PRN
  Administered 2022-01-02: 4 mg via INTRAVENOUS

## 2022-01-02 MED ORDER — ROCURONIUM BROMIDE 10 MG/ML (PF) SYRINGE
PREFILLED_SYRINGE | INTRAVENOUS | Status: AC
  Filled 2022-01-02: qty 10

## 2022-01-02 MED ORDER — BACITRACIN ZINC 500 UNIT/GM EX OINT
TOPICAL_OINTMENT | CUTANEOUS | Status: DC | PRN
Start: 2022-01-02 — End: 2022-01-02
  Administered 2022-01-02: 1 via TOPICAL

## 2022-01-02 MED ORDER — CHLORHEXIDINE GLUCONATE 0.12 % MT SOLN: 15.0000 mL | Freq: Once | OROMUCOSAL | Status: DC

## 2022-01-02 MED ORDER — PHENYLEPHRINE 40 MCG/ML (10ML) SYRINGE FOR IV PUSH (FOR BLOOD PRESSURE SUPPORT)
PREFILLED_SYRINGE | INTRAVENOUS | Status: DC | PRN
Start: 2022-01-02 — End: 2022-01-02
  Administered 2022-01-02 (×3): 80 ug via INTRAVENOUS
  Administered 2022-01-02: 120 ug via INTRAVENOUS

## 2022-01-02 MED ORDER — PROPOFOL 10 MG/ML IV BOLUS
INTRAVENOUS | Status: AC
  Filled 2022-01-02: qty 20

## 2022-01-02 MED ORDER — FENTANYL CITRATE (PF) 250 MCG/5ML IJ SOLN
INTRAMUSCULAR | Status: DC | PRN
  Administered 2022-01-02: 100 ug via INTRAVENOUS

## 2022-01-02 MED ORDER — SODIUM CHLORIDE 0.9 % IV SOLN: INTRAVENOUS | Status: DC

## 2022-01-02 MED ORDER — SUGAMMADEX SODIUM 200 MG/2ML IV SOLN
INTRAVENOUS | Status: DC | PRN
  Administered 2022-01-02: 200 mg via INTRAVENOUS

## 2022-01-02 MED ORDER — ROCURONIUM BROMIDE 10 MG/ML (PF) SYRINGE
PREFILLED_SYRINGE | INTRAVENOUS | Status: DC | PRN
Start: 2022-01-02 — End: 2022-01-02
  Administered 2022-01-02: 70 mg via INTRAVENOUS

## 2022-01-02 MED ORDER — 0.9 % SODIUM CHLORIDE (POUR BTL) OPTIME
TOPICAL | Status: DC | PRN
Start: 2022-01-02 — End: 2022-01-02
  Administered 2022-01-02: 2000 mL

## 2022-01-02 SURGICAL SUPPLY — 89 items
BAG COUNTER SPONGE SURGICOUNT (BAG) ×2 IMPLANT
BAND RUBBER #18 3X1/16 STRL (MISCELLANEOUS) IMPLANT
BENZOIN TINCTURE PRP APPL 2/3 (GAUZE/BANDAGES/DRESSINGS) IMPLANT
BLADE CLIPPER SURG (BLADE) ×2 IMPLANT
BLADE SAW GIGLI 16 STRL (MISCELLANEOUS) IMPLANT
BLADE SURG 15 STRL LF DISP TIS (BLADE) IMPLANT
BLADE SURG 15 STRL SS (BLADE)
BNDG GAUZE ELAST 4 BULKY (GAUZE/BANDAGES/DRESSINGS) IMPLANT
BNDG STRETCH 4X75 STRL LF (GAUZE/BANDAGES/DRESSINGS) IMPLANT
BUR ACORN 9.0 PRECISION (BURR) ×2 IMPLANT
BUR ROUND FLUTED 4 SOFT TCH (BURR) IMPLANT
BUR SPIRAL ROUTER 2.3 (BUR) ×1 IMPLANT
CANISTER SUCT 3000ML PPV (MISCELLANEOUS) ×4 IMPLANT
CATH VENTRIC 35X38 W/TROCAR LG (CATHETERS) IMPLANT
CLIP VESOCCLUDE MED 6/CT (CLIP) IMPLANT
CNTNR URN SCR LID CUP LEK RST (MISCELLANEOUS) ×1 IMPLANT
CONT SPEC 4OZ STRL OR WHT (MISCELLANEOUS) ×1
COVER MAYO STAND STRL (DRAPES) IMPLANT
DECANTER SPIKE VIAL GLASS SM (MISCELLANEOUS) ×1 IMPLANT
DERMABOND ADVANCED (GAUZE/BANDAGES/DRESSINGS) ×1
DERMABOND ADVANCED .7 DNX12 (GAUZE/BANDAGES/DRESSINGS) IMPLANT
DRAIN SUBARACHNOID (WOUND CARE) IMPLANT
DRAPE HALF SHEET 40X57 (DRAPES) ×2 IMPLANT
DRAPE MICROSCOPE LEICA (MISCELLANEOUS) IMPLANT
DRAPE NEUROLOGICAL W/INCISE (DRAPES) ×2 IMPLANT
DRAPE SHEET LG 3/4 BI-LAMINATE (DRAPES) ×1 IMPLANT
DRAPE STERI IOBAN 125X83 (DRAPES) IMPLANT
DRAPE SURG 17X23 STRL (DRAPES) IMPLANT
DRAPE WARM FLUID 44X44 (DRAPES) ×2 IMPLANT
DRSG ADAPTIC 3X8 NADH LF (GAUZE/BANDAGES/DRESSINGS) IMPLANT
DRSG TELFA 3X8 NADH (GAUZE/BANDAGES/DRESSINGS) ×2 IMPLANT
DURAPREP 6ML APPLICATOR 50/CS (WOUND CARE) ×2 IMPLANT
ELECT REM PT RETURN 9FT ADLT (ELECTROSURGICAL) ×2
ELECTRODE REM PT RTRN 9FT ADLT (ELECTROSURGICAL) ×1 IMPLANT
EVACUATOR 1/8 PVC DRAIN (DRAIN) IMPLANT
EVACUATOR SILICONE 100CC (DRAIN) IMPLANT
FORCEPS BIPO MALIS IRRIG 9X1.5 (NEUROSURGERY SUPPLIES) ×1 IMPLANT
GAUZE 4X4 16PLY ~~LOC~~+RFID DBL (SPONGE) ×1 IMPLANT
GAUZE SPONGE 4X4 12PLY STRL (GAUZE/BANDAGES/DRESSINGS) IMPLANT
GLOVE EXAM NITRILE LRG STRL (GLOVE) IMPLANT
GLOVE EXAM NITRILE XL STR (GLOVE) IMPLANT
GLOVE EXAM NITRILE XS STR PU (GLOVE) IMPLANT
GLOVE SURG LTX SZ7.5 (GLOVE) ×2 IMPLANT
GLOVE SURG UNDER POLY LF SZ7.5 (GLOVE) ×2 IMPLANT
GOWN STRL REUS W/ TWL LRG LVL3 (GOWN DISPOSABLE) ×2 IMPLANT
GOWN STRL REUS W/ TWL XL LVL3 (GOWN DISPOSABLE) IMPLANT
GOWN STRL REUS W/TWL 2XL LVL3 (GOWN DISPOSABLE) IMPLANT
GOWN STRL REUS W/TWL LRG LVL3 (GOWN DISPOSABLE) ×2
GOWN STRL REUS W/TWL XL LVL3 (GOWN DISPOSABLE)
HEMOSTAT POWDER KIT SURGIFOAM (HEMOSTASIS) ×2 IMPLANT
HEMOSTAT SURGICEL 2X14 (HEMOSTASIS) ×1 IMPLANT
HOOK DURA 1/2IN (MISCELLANEOUS) ×1 IMPLANT
IV NS 1000ML (IV SOLUTION)
IV NS 1000ML BAXH (IV SOLUTION) ×1 IMPLANT
KIT BASIN OR (CUSTOM PROCEDURE TRAY) ×2 IMPLANT
KIT DRAIN CSF ACCUDRAIN (MISCELLANEOUS) IMPLANT
KIT NDL BIOPSY 1.8X235 CRAN (NEEDLE) IMPLANT
KIT NEEDLE BIOPSY 1.8X235 CRAN (NEEDLE) ×2 IMPLANT
KIT TURNOVER KIT B (KITS) ×2 IMPLANT
KIT VARIOGUIDE DRILL 1.9 (KITS) ×1 IMPLANT
MARKER SPHERE PSV REFLC 13MM (MARKER) ×6 IMPLANT
NDL SPNL 18GX3.5 QUINCKE PK (NEEDLE) IMPLANT
NEEDLE HYPO 22GX1.5 SAFETY (NEEDLE) ×2 IMPLANT
NEEDLE SPNL 18GX3.5 QUINCKE PK (NEEDLE) IMPLANT
NS IRRIG 1000ML POUR BTL (IV SOLUTION) ×5 IMPLANT
PACK CRANIOTOMY CUSTOM (CUSTOM PROCEDURE TRAY) ×2 IMPLANT
PAD DRESSING TELFA 3X8 NADH (GAUZE/BANDAGES/DRESSINGS) IMPLANT
PATTIES SURGICAL .25X.25 (GAUZE/BANDAGES/DRESSINGS) IMPLANT
PATTIES SURGICAL .5 X.5 (GAUZE/BANDAGES/DRESSINGS) IMPLANT
PATTIES SURGICAL .5 X3 (DISPOSABLE) IMPLANT
PATTIES SURGICAL 1/4 X 3 (GAUZE/BANDAGES/DRESSINGS) IMPLANT
PATTIES SURGICAL 1X1 (DISPOSABLE) IMPLANT
PIN MAYFIELD SKULL DISP (PIN) ×2 IMPLANT
SPECIMEN JAR SMALL (MISCELLANEOUS) ×1 IMPLANT
SPONGE NEURO XRAY DETECT 1X3 (DISPOSABLE) IMPLANT
SPONGE SURGIFOAM ABS GEL 100 (HEMOSTASIS) ×1 IMPLANT
STAPLER VISISTAT 35W (STAPLE) ×2 IMPLANT
SUT ETHILON 3 0 FSL (SUTURE) IMPLANT
SUT ETHILON 3 0 PS 1 (SUTURE) IMPLANT
SUT MNCRL AB 3-0 PS2 18 (SUTURE) ×1 IMPLANT
SUT NURALON 4 0 TR CR/8 (SUTURE) ×3 IMPLANT
SUT SILK 0 TIES 10X30 (SUTURE) IMPLANT
SUT VIC AB 2-0 CP2 18 (SUTURE) ×2 IMPLANT
TOWEL GREEN STERILE (TOWEL DISPOSABLE) ×2 IMPLANT
TOWEL GREEN STERILE FF (TOWEL DISPOSABLE) ×2 IMPLANT
TRAY FOLEY MTR SLVR 16FR STAT (SET/KITS/TRAYS/PACK) ×1 IMPLANT
TUBE CONNECTING 12X1/4 (SUCTIONS) ×2 IMPLANT
UNDERPAD 30X36 HEAVY ABSORB (UNDERPADS AND DIAPERS) ×2 IMPLANT
WATER STERILE IRR 1000ML POUR (IV SOLUTION) ×2 IMPLANT

## 2022-01-02 NOTE — Progress Notes (Signed)
Neurosurgery Service Progress Note  Subjective: No acute events overnight, no new complaints this morning   Objective: Vitals:   01/01/22 2351 01/02/22 0351 01/02/22 0754 01/02/22 1226  BP: 138/63 (!) 126/50 (!) 145/53 (!) 151/67  Pulse: 65 61 68 65  Resp: 17 17 16 18   Temp: 98.8 F (37.1 C) 97.7 F (36.5 C) 98.1 F (36.7 C) 98 F (36.7 C)  TempSrc: Oral Oral Oral Oral  SpO2: 99% 94% 95% 95%  Weight:      Height:        Physical Exam: AOx3, PERRL, EOMI, FS, speech fluent with normal content Strength 5/5 x4, SILTx4, no drift  Assessment & Plan: 76 y.o. woman w/ newly diagnosed left frontal mass, no known primary.  -OR today for stereotactic biopsy, 4N ICU post-op overnight then form my standpoint if she's doing well can be discharged tomorrow  Judith Part  01/02/22 1:05 PM

## 2022-01-02 NOTE — Progress Notes (Signed)
PROGRESS NOTE  Hailea Eaglin  IAX:[id number] DOB: [birth year] DOA: 12/30/2021 PCP: Karleen Hampshire., MD   Brief Narrative: Layanna Charo is a 76 y.o. female with a history of HTN, prediabetes who presented to the ED 12/30/2021 with hip pain after a fall at home. In the ED she was afebrile with negative CXR, XR of pelvis and bilateral hips and right scapula. CT of the pelvis negative. Head CT performed with possible head trauma was concerning for left inferior frontal lobe lesion with surrounding vasogenic edema and CT cervical spine notable for lytic lesion along the base of the dens.  MRI was attempted with Ativan but the patient was unable to tolerate the study despite this.  Neurosurgery was consulted by the ED physician, indicated that MRI will be needed to formulate plan, and recommended admission for MRI under sedation. This confirmed mass and biopsy was performed 01/02/2022.  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  - Neurosurgery, Dr. Zada Finders, performed brain biopsy 1/5, will dispo to 4N from PACU, possible transfer to floor vs. home in AM if stable.  - Neurochecks.  Fall at home, right hip pain: Negative radiographic survey.  - Fall precautions. No weight-bearing restrictions.   Sinus rhythm with PACs: Computer interpretation of atrial fibrillation is not correct, has no history of this. ECGs personally reviewed by myself as well as cardiology, Dr. Julianne Handler with concordant conclusion.   HTN:  - Continue ARB  Prediabetes: HbA1c 5.9%.   History of trigeminal neuralgia: On right, symptoms improved s/p microdecompression.  - Continue gabapentin, tramadol. No hx seizures or clinical appearance of seizures.   DVT prophylaxis: SCDs Code Status: Full Family  Communication: Son at bedside Disposition Plan:  Status is: Inpatient  Consultants:  Neurosurgery Cardiology, Dr. Julianne Handler  Procedures:  Brain biopsy  Antimicrobials: None   Subjective: No headache currently. No chest pain, dyspnea or palpitations or new numbness or weakness.   Objective: Vitals:   01/02/22 1700 01/02/22 1715 01/02/22 1730 01/02/22 1745  BP: 136/63 139/67 (!) 142/66 (!) 123/56  Pulse: 73 62 70 70  Resp: 15 16 15 16   Temp:      TempSrc:      SpO2: 95% 96% 95% 96%  Weight:      Height:       No intake or output data in the 24 hours ending 01/02/22 1801  Filed Weights   12/30/21 1645  Weight: 83.5 kg   Gen: 76 y.o. female in no distress Pulm: Nonlabored breathing room air. Clear. CV: Regular rate and rhythm. No murmur, rub, or gallop. No JVD, no dependent edema. GI: Abdomen soft, non-tender, non-distended, with normoactive bowel sounds.  Ext: Warm, no deformities Skin: No rashes, lesions or ulcers on visualized skin. Neuro: Alert and oriented. No focal neurological deficits. Psych: Judgement and insight appear fair. Mood euthymic & affect congruent. Behavior is appropriate.    Data Reviewed: I have personally reviewed following labs and imaging studies  CBC: Recent Labs  Lab 12/30/21 1732 12/31/21 0725  WBC 11.7* 7.1  NEUTROABS 10.2*  --   HGB 13.6 11.2*  HCT 43.6 35.4*  MCV 89.0 89.2  PLT 265 078   Basic Metabolic Panel: Recent Labs  Lab 12/30/21 1732 12/31/21 0725  NA 136 137  K 3.6  3.9  CL 102 108  CO2 24 23  GLUCOSE 152* 104*  BUN 13 11  CREATININE 0.82 0.68  CALCIUM 8.6* 7.1*  MG  --  1.6*   GFR: Estimated Creatinine Clearance: 65.5 mL/min (by C-G formula based on SCr of 0.68 mg/dL). Liver Function Tests: Recent Labs  Lab 12/30/21 1732  AST 31  ALT 38  ALKPHOS 59  BILITOT 1.1  PROT 6.3*  ALBUMIN 3.6   No results for input(s): LIPASE, AMYLASE in the last 168 hours. No results for input(s): AMMONIA in the last  168 hours. Coagulation Profile: No results for input(s): INR, PROTIME in the last 168 hours. Cardiac Enzymes: Recent Labs  Lab 12/30/21 1732  CKTOTAL 105   BNP (last 3 results) No results for input(s): PROBNP in the last 8760 hours. HbA1C: Recent Labs    01/01/22 1550  HGBA1C 5.9*   CBG: Recent Labs  Lab 12/30/21 1741 01/02/22 1452 01/02/22 1643  GLUCAP 148* 107* 109*   Lipid Profile: No results for input(s): CHOL, HDL, LDLCALC, TRIG, CHOLHDL, LDLDIRECT in the last 72 hours. Thyroid Function Tests: Recent Labs    12/31/21 0726  TSH 0.871   Anemia Panel: No results for input(s): VITAMINB12, FOLATE, FERRITIN, TIBC, IRON, RETICCTPCT in the last 72 hours. Urine analysis: No results found for: COLORURINE, APPEARANCEUR, LABSPEC, PHURINE, GLUCOSEU, HGBUR, BILIRUBINUR, KETONESUR, PROTEINUR, UROBILINOGEN, NITRITE, LEUKOCYTESUR Recent Results (from the past 240 hour(s))  Resp Panel by RT-PCR (Flu A&B, Covid) Nasopharyngeal Swab     Status: None   Collection Time: 12/30/21  5:28 PM   Specimen: Nasopharyngeal Swab; Nasopharyngeal(NP) swabs in vial transport medium  Result Value Ref Range Status   SARS Coronavirus 2 by RT PCR NEGATIVE NEGATIVE Final    Comment: (NOTE) SARS-CoV-2 target nucleic acids are NOT DETECTED.  The SARS-CoV-2 RNA is generally detectable in upper respiratory specimens during the acute phase of infection. The lowest concentration of SARS-CoV-2 viral copies this assay can detect is 138 copies/mL. A negative result does not preclude SARS-Cov-2 infection and should not be used as the sole basis for treatment or other patient management decisions. A negative result may occur with  improper specimen collection/handling, submission of specimen other than nasopharyngeal swab, presence of viral mutation(s) within the areas targeted by this assay, and inadequate number of viral copies(<138 copies/mL). A negative result must be combined with clinical  observations, patient history, and epidemiological information. The expected result is Negative.  Fact Sheet for Patients:  EntrepreneurPulse.com.au  Fact Sheet for Healthcare Providers:  IncredibleEmployment.be  This test is no t yet approved or cleared by the [place] FDA and  has been authorized for detection and/or diagnosis of SARS-CoV-2 by FDA under an Emergency Use Authorization (EUA). This EUA will remain  in effect (meaning this test can be used) for the duration of the COVID-19 declaration under Section 564(b)(1) of the Act, 21 U.S.C.section 360bbb-3(b)(1), unless the authorization is terminated  or revoked sooner.       Influenza A by PCR NEGATIVE NEGATIVE Final   Influenza B by PCR NEGATIVE NEGATIVE Final    Comment: (NOTE) The Xpert Xpress SARS-CoV-2/FLU/RSV plus assay is intended as an aid in the diagnosis of influenza from Nasopharyngeal swab specimens and should not be used as a sole basis for treatment. Nasal washings and aspirates are unacceptable for Xpert Xpress SARS-CoV-2/FLU/RSV testing.  Fact Sheet for Patients: EntrepreneurPulse.com.au  Fact Sheet for Healthcare Providers: IncredibleEmployment.be  This test is not yet approved or cleared by the [place]  FDA and has been authorized for detection and/or diagnosis of SARS-CoV-2 by FDA under an Emergency Use Authorization (EUA). This EUA will remain in effect (meaning this test can be used) for the duration of the COVID-19 declaration under Section 564(b)(1) of the Act, 21 U.S.C. section 360bbb-3(b)(1), unless the authorization is terminated or revoked.  Performed at [facility], [place] [address] [place], [place] [address]       Radiology Studies: MR BRAIN W WO CONTRAST  Result Date: 01/01/2022 CLINICAL DATA:  Left frontal mass EXAM: MRI HEAD WITHOUT AND WITH CONTRAST TECHNIQUE: Multiplanar, multiecho pulse  sequences of the brain and surrounding structures were obtained without and with intravenous contrast. CONTRAST:  46mL GADAVIST GADOBUTROL 1 MMOL/ML IV SOLN COMPARISON:  Brain MRI 12/30/2021, CT head 12/30/2021, brain MRI 07/13/2018 FINDINGS: Brain: There is masslike FLAIR signal abnormality in the left anterior frontal lobe. There is homogeneous enhancement within the area of signal abnormality measuring 1.8 cm AP by 1.4 cm TV by 1.1 cm cc. There is faint diffusion restriction within this area of enhancement. FLAIR signal abnormality surrounding the enhancement is seen in the adjacent left frontal lobe subcortical white matter as well as crossing the midline via the genu of the corpus callosum into the right anterior frontal lobe. No other convincing enhancement is seen within the areas of FLAIR signal abnormality. There is no SWI signal dropout to suggest hemorrhage. There is no cystic change. Background of mild parenchymal volume loss and chronic white matter microangiopathy is unchanged. Sulcal FLAIR hyperintensity overlying the occipital lobes is favored to be artifactual. There is partial effacement of the left frontal horn by the above-described mass. The ventricles are otherwise normal in size. There is no other abnormal enhancement.  There is no midline shift. Vascular: Normal flow voids. Skull and upper cervical spine: Normal marrow signal. Sinuses/Orbits: The paranasal sinuses are clear. Bilateral lens implants are in place. The globes and orbits are otherwise unremarkable. Other: None. IMPRESSION: Homogeneously enhancing lesion in the left frontal lobe with surrounding edema and/or infiltrative tumor which crosses midline via the genu of the corpus callosum described above. 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 Electronically Signed   By: Valetta Mole M.D.   On: 01/01/2022 10:06   CT BRAINLAB HEAD W/O CONTRAST (1MM)  Result Date:  01/01/2022 CLINICAL DATA:  BrainLAB CT for operative planning. EXAM: CT HEAD WITHOUT CONTRAST TECHNIQUE: Contiguous axial images were obtained from the base of the skull through the vertex without intravenous contrast. COMPARISON:  12/30/2021 CT head, correlation is also made with MRI brain 01/01/2022 FINDINGS: Brain: Redemonstrated 2.1 x 1.1 x 2.0 cm left inferior frontal lobe lesion with surrounding vasogenic edema (series 3, image 104), unchanged when remeasured similarly. Mild mass effect on the left frontal horn. No hydrocephalus or extra-axial collection. No acute infarct, hemorrhage, or midline shift. Hyperdense lesion along the right aspect of the tentorium adjacent to the pons, which measures 6 x 9 x 4 mm (series 3, image 65 and series 6, image 69), present on the prior CT but not well seen likely secondary to volume averaging. This correlates with a mildly enhancing lesion on the prior MRI (series 10, image 18). Vascular: No hyperdense vessel. Atherosclerotic calcifications in the intracranial carotid and vertebral arteries. Skull: Hyperostosis frontalis. Prior right suboccipital craniotomy. Partially imaged lucent lesions in the body and posterior elements of C2. No acute fracture. Sinuses/Orbits: No acute finding. Status post bilateral lens replacements. Other:  Trace fluid in right mastoid air cells. IMPRESSION: 1. Redemonstrated 2.1 cm left inferior frontal lobe lesion with surrounding vasogenic edema, not significantly changed from prior exam. 2. 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 Electronically Signed   By: Merilyn Baba M.D.   On: 01/01/2022 18:34    Scheduled Meds:  chlorhexidine  15 mL Mouth/Throat Once   Or   mouth rinse  15 mL Mouth Rinse Once   chlorhexidine       [MAR Hold] gabapentin  900 mg Oral BID   [MAR Hold] irbesartan  150 mg Oral  QHS   [MAR Hold] lidocaine  1 patch Transdermal Q24H   [MAR Hold] sodium chloride flush  3 mL Intravenous Q12H   [MAR Hold] traMADol  50 mg Oral BID   Continuous Infusions:  sodium chloride 10 mL/hr at 01/02/22 1525     LOS: 0 days   Patrecia Pour, MD [facility] [email or web address] 01/02/2022, 6:01 PM

## 2022-01-02 NOTE — Progress Notes (Signed)
Neurosurgery Service Post-operative progress note  Assessment & Plan: 76 y.o. woman s/p Sx brain Bx, seen in PACU, awake/alert, speech fluent, MAEx4, recovering well.  -4N ICU -post-op CTH w/o contrast -advance diet as tolerated -if she does well overnight, d/c home vs transfer to floor in AM  [place]  01/02/22 4:49 PM

## 2022-01-02 NOTE — Op Note (Signed)
PATIENT: Isabel Gibbs  DAY OF SURGERY: 01/02/22   PRE-OPERATIVE DIAGNOSIS:  Left frontal brain tumor   POST-OPERATIVE DIAGNOSIS:  Same   PROCEDURE:  Left stereotactic brain biopsy   SURGEON:  Surgeon(s) and Role:    Judith Part, MD - Primary   ANESTHESIA: ETGA   BRIEF HISTORY: This is a 76  year old woman who presented after a fall off her commode, workup revealed a left frontal enhancing mass, workup did not show a primary at another location. I therefore recommended a stereotactic biopsy. This was discussed with the patient as well as risks, benefits, and alternatives and wished to proceed with surgery.   OPERATIVE DETAIL: The patient was taken to the operating room and placed on the OR table in the supine position. A formal time out was performed with two patient identifiers and confirmed the operative site. Anesthesia was induced by the anesthesia team. The [place] head holder was applied to the head and a registration array was attached to the [place]. This was co-registered with the patient's preoperative imaging, the fit appeared to be acceptable.   Preoperatively, I planned a trajectory on the brainlab system to target the tumor, avoid venous structures, and have the cutting window span the lesion. This was then transferred to the Brainlab intraoperative navigation system.  Using frameless stereotaxy, the operative trajectory was planned and the incision was marked. Hair was clipped with surgical clippers over the incision and the area was then prepped and draped in a sterile fashion.  The varioguide was attached, registered, and trajectory locked in. A small incision was made and the CD4 drill was used with a reducing tube to create a small burr hole along the trajectory. The dura was pierced with the dural piercing instrument, the reducing tube was switched and the image-guided Brainlab needle was advanced to the pre-determined depth along the trajectory, monitoring the  location along the pathway with image guidance. The cutting window was opened and biopsy samples were taken then placed on telfa. There was clearly abnormal appearing tissue. Biopsies were taken at all 4 quadrants (anterior, medial, posterior, and lateral). An air bubble was placed in the biopsy cavity to correlate with post-operative imaging. The biopsy tract was irrigated until clear irrigation returned, the needle was removed, and the incision was irrigated copiously. The biopsy specimen was sent to pathology for analysis.  All instrument and sponge counts were correct, the incision was then closed in layers. The patient was then returned to anesthesia for emergence. No apparent complications at the completion of the procedure.   EBL:  14mL   DRAINS: none   SPECIMENS: Left frontal brain tumor  Judith Part, MD 01/02/22 2:52 PM

## 2022-01-02 NOTE — Transfer of Care (Signed)
Immediate Anesthesia Transfer of Care Note  Patient: Isabel Gibbs  Procedure(s) Performed: LEFT BRAIN  BIOPSY WITH BRAINLAB (Left) APPLICATION OF CRANIAL NAVIGATION (Left)  Patient Location: PACU  Anesthesia Type:General  Level of Consciousness: awake, alert  and oriented  Airway & Oxygen Therapy: Patient Spontanous Breathing  Post-op Assessment: Report given to RN and Post -op Vital signs reviewed and stable  Post vital signs: Reviewed and stable  Last Vitals:  Vitals Value Taken Time  BP 132/83 01/02/22 1645  Temp 36.2 C 01/02/22 1645  Pulse 74 01/02/22 1651  Resp 19 01/02/22 1651  SpO2 95 % 01/02/22 1651  Vitals shown include unvalidated device data.  Last Pain:  Vitals:   01/02/22 1457  TempSrc:   PainSc: 0-No pain         Complications: No notable events documented.

## 2022-01-02 NOTE — Anesthesia Procedure Notes (Signed)
Procedure Name: Intubation Date/Time: 01/02/2022 3:36 PM Performed by: Griffin Dakin, CRNA Pre-anesthesia Checklist: Patient identified, Emergency Drugs available, Suction available and Patient being monitored Patient Re-evaluated:Patient Re-evaluated prior to induction Oxygen Delivery Method: Circle system utilized Preoxygenation: Pre-oxygenation with 100% oxygen Induction Type: IV induction Ventilation: Mask ventilation without difficulty Laryngoscope Size: Mac and 4 Grade View: Grade II Tube type: Oral Tube size: 7.0 mm Number of attempts: 1 Airway Equipment and Method: Stylet and Oral airway Placement Confirmation: ETT inserted through vocal cords under direct vision, positive ETCO2 and breath sounds checked- equal and bilateral Secured at: 24 cm Tube secured with: Tape Dental Injury: Teeth and Oropharynx as per pre-operative assessment

## 2022-01-03 ENCOUNTER — Encounter (HOSPITAL_COMMUNITY): Payer: Self-pay | Admitting: Neurological Surgery

## 2022-01-03 LAB — MRSA NEXT GEN BY PCR, NASAL: MRSA by PCR Next Gen: NOT DETECTED

## 2022-01-03 NOTE — Discharge Instructions (Signed)
Discharge Instructions  No restriction in activities, slowly increase your activity back to normal.   Your incision is closed with absorbable sutures. These will naturally fall off over the next 4-6 weeks. If they become bothersome or cause discomfort, apply some antibiotic ointment like bacitracin or neosporin on the sutures. This will soften them up and usually makes them more comfortable while they dissolve.  Okay to shower on the day of discharge. Be gentle when cleaning your incision. Use regular soap and water. If that is uncomfortable, try using baby shampoo. Do not submerge the wound under water for 2 weeks after surgery.  Follow up with Dr. Myeasha Ballowe in 2 weeks after discharge. If you do not already have a discharge appointment, please call his office at [phone] to schedule a follow up appointment. If you have any concerns or questions, please call the office and let us know. 

## 2022-01-03 NOTE — Progress Notes (Signed)
Neurosurgery Service Progress Note  Subjective: No acute events overnight, no new complaints this morning   Objective: Vitals:   01/03/22 0600 01/03/22 0700 01/03/22 0800 01/03/22 0900  BP:    (!) 114/51  Pulse:      Resp: (!) 21 19 19 18   Temp:   98.2 F (36.8 C)   TempSrc:   Oral   SpO2:      Weight:      Height:        Physical Exam: AOx3, PERRL, EOMI, FS, speech fluent with normal content Strength 5/5 x4, SILTx4, no drift  Assessment & Plan: 76 y.o. woman w/ newly diagnosed left frontal mass, no known primary.  -okay for discharge home from my perspective, added on to tumor board, will f/u path, d/c instructions placed in [place]  01/03/22 9:56 AM

## 2022-01-03 NOTE — Discharge Summary (Signed)
Physician Discharge Summary  Isabel Gibbs JJO:[id number] DOB: [birth year] DOA: 12/30/2021  PCP: Karleen Hampshire., MD  Admit date: 12/30/2021 Discharge date: 01/03/2022  Admitted From: Home Disposition: Home   Recommendations for Outpatient Follow-up:  Follow up with PCP in 1-2 weeks Follow up with neurosurgery, Dr. Zada Finders, and/or neurooncology, Dr. Mickeal Skinner, depending on results of pathology from brain biopsy performed 1/5. Patient will be discussed at tumor board post-discharge.  Home Health: None Equipment/Devices: None Discharge Condition: Stable CODE STATUS: Full Diet recommendation: Heart healthy  Brief/Interim Summary: Isabel Gibbs is a 76 y.o. female with a history of HTN, prediabetes who presented to the ED 12/30/2021 with hip pain after a fall at home. In the ED she was afebrile with negative CXR, XR of pelvis and bilateral hips and right scapula. CT of the pelvis negative. Head CT performed with possible head trauma was concerning for left inferior frontal lobe lesion with surrounding vasogenic edema and CT cervical spine notable for lytic lesion along the base of the dens.  MRI was attempted with Ativan but the patient was unable to tolerate the study despite this.  Neurosurgery was consulted by the ED physician, indicated that MRI will be needed to formulate plan, and recommended admission for MRI under sedation. This confirmed mass and biopsy was performed 01/02/2022 with uneventful recovery and clearance for discharge the following morning in stable condition.  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 Postoperative head CT noted, asymptomatic. - Neurosurgery, Dr. Zada Finders, performed brain biopsy 1/5 with uncomplicated postoperative period thus far. Cleared  for discharge 1/6 by NSG. - Has been added on for tumor board meeting, follow up TBD.   Fall at home, right hip pain: Negative radiographic survey.  - Fall precautions. No weight-bearing restrictions.    Sinus rhythm with PACs: Computer interpretation of atrial fibrillation is not correct, has no history of this. ECGs personally reviewed by myself as well as cardiology, Dr. Julianne Handler with concordant conclusion.    HTN:  - Continue ARB   Prediabetes: HbA1c 5.9%.    History of trigeminal neuralgia: On right, symptoms improved s/p microdecompression.  - Continue gabapentin, tramadol. No hx seizures or clinical appearance of seizures.   Discharge Instructions Discharge Instructions     Diet - low sodium heart healthy   Complete by: As directed    Discharge instructions   Complete by: As directed    You were admitted for work up of a brain mass which has been biopsied. The results will return in the next week most likely and the case will be discussed at tumor board. Please follow up with Dr. Zada Finders (neurosurgery) and/or other providers as recommended by Dr. Zada Finders for ongoing treatment based on the results. You have been cleared for discharge, though would need to return if you had severe headache, any new numbness or weakness or changes in vision, speech, or hearing. You had an ECG that was initially thought to represent AFib, but this is not the case and you don't need any new medications for this at all.   Increase activity slowly   Complete by: As directed    No wound care   Complete by: As directed       Allergies as of 01/03/2022       Reactions  Codeine Other (See Comments)   Severe GI upset   Lisinopril Cough      Hydrocodone-acetaminophen Nausea Only        Medication List     TAKE these medications    acetaminophen 500 MG tablet Commonly known as: TYLENOL Take 500-1,000 mg by mouth 2 (two) times daily as needed for mild pain.   alendronate 70 MG  tablet Commonly known as: FOSAMAX Take 70 mg by mouth every Friday.   gabapentin 300 MG capsule Commonly known as: NEURONTIN Take 900 mg by mouth See admin instructions. Take 900 mg by mouth in the evening and at bedtime   irbesartan 150 MG tablet Commonly known as: AVAPRO Take 150 mg by mouth at bedtime.   Systane Ultra PF 0.4-0.3 % Soln Generic drug: Polyethyl Glyc-Propyl Glyc PF Place 1 drop into both eyes 3 (three) times daily as needed (for dryness).   traMADol 50 MG tablet Commonly known as: ULTRAM Take 50 mg by mouth See admin instructions. Take 50 mg by mouth in the morning and evening   Vitamin D (Ergocalciferol) 1.25 MG (50000 UNIT) Caps capsule Commonly known as: DRISDOL Take 50,000 Units by mouth every Wednesday.   Voltaren 1 % Gel Generic drug: diclofenac Sodium Apply 2 g topically 4 (four) times daily as needed (for bilateral knee pain).        Follow-up Information     Karleen Hampshire., MD. Schedule an appointment as soon as possible for a visit in 1 week(s).   Specialty: Internal Medicine Contact information: [address] [place] [address] [phone]         Judith Part, MD. Call.   Specialty: Neurosurgery Why: Call in the next week if you aren't contacted regarding your biopsy result. Contact information: [place] [address] [phone]                Allergies  Allergen Reactions   Codeine Other (See Comments)    Severe GI upset    Lisinopril Cough        Hydrocodone-Acetaminophen Nausea Only    Consultations: Neurosurgery  Procedures/Studies: DG Chest 2 View  Result Date: 12/30/2021 CLINICAL DATA:  Fall with back pain. EXAM: CHEST - 2 VIEW COMPARISON:  Chest x-ray 02/23/2017. FINDINGS: The heart size and mediastinal contours are within normal limits. Both lungs are clear. The visualized skeletal structures are unremarkable. There are surgical clips in the upper abdomen.  IMPRESSION: No active cardiopulmonary disease. Electronically Signed   By: Ronney Asters M.D.   On: 12/30/2021 19:10   DG Scapula Right  Result Date: 12/30/2021 CLINICAL DATA:  fall, back/hip pain EXAM: RIGHT SCAPULA - 2+ VIEWS COMPARISON:  None. FINDINGS: There is no evidence of fracture or other focal bone lesions. Degenerative changes of the right acromioclavicular joint. Soft tissues are unremarkable. IMPRESSION: Negative. Electronically Signed   By: Iven Finn M.D.   On: 12/30/2021 19:08   CT HEAD WO CONTRAST (5MM)  Addendum Date: 01/03/2022   ADDENDUM REPORT: 01/03/2022 00:27 ADDENDUM: Findings were discussed with Dr. Ellene Route at 12:25 a.m. Electronically Signed   By: Brett Fairy M.D.   On: 01/03/2022 00:27   Result Date: 01/03/2022 CLINICAL DATA:  Brain/CNS neoplasm. Assess treatment response. Status post stereotactic brain biopsy. EXAM: CT HEAD WITHOUT CONTRAST TECHNIQUE: Contiguous axial images were obtained from the base of the skull through the vertex without intravenous contrast. COMPARISON:  01/01/2022. FINDINGS: Brain: There is a mass measuring 2 cm  containing small focus of hemorrhage and air in the inferior aspect of the frontal lobe on the left measuring 1.1 x 0.9 cm. Mild surrounding vasogenic edema is noted resulting in mild mass effect. No significant midline shift. There is no hydrocephalus. A stable hyperdense lesion is noted along the tentorium on the right 1.0 x 0.6 cm. Vascular: No hyperdense vessel or unexpected calcification. Skull: Craniotomy changes are noted in the occipital region on the right, unchanged from the previous exam. The calvarium is otherwise intact. Sinuses/Orbits: No acute finding. Other: None. IMPRESSION: 1. Status post stereotactic biopsy of mass in the inferior aspect of the frontal lobe on the left. There is a small focus of hemorrhage and air within the lesion measuring 1.1 x 0.9 cm. Surrounding vasogenic edema is unchanged. 2. 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 Electronically Signed: By: Brett Fairy M.D. On: 01/02/2022 23:34   CT Head Wo Contrast  Result Date: 12/30/2021 CLINICAL DATA:  Status post fall EXAM: CT HEAD WITHOUT CONTRAST CT CERVICAL SPINE WITHOUT CONTRAST TECHNIQUE: Multidetector CT imaging of the head and cervical spine was performed following the standard protocol without intravenous contrast. Multiplanar CT image reconstructions of the cervical spine were also generated. COMPARISON:  MRI head 07/13/2018 FINDINGS: CT HEAD FINDINGS BRAIN: BRAIN Patchy and confluent areas of decreased attenuation are noted throughout the deep and periventricular white matter of the cerebral hemispheres bilaterally, compatible with chronic microvascular ischemic disease. No evidence of large-territorial acute infarction. No parenchymal hemorrhage. Suggestion of a 1.9 x 1.2 x 2cm left inferior frontal lobe lesion with surrounding vasogenic edema. No extra-axial collection. No mass effect or midline shift. No hydrocephalus. Basilar cisterns are patent. Vascular: No hyperdense vessel. Skull: No acute fracture or focal lesion. Sinuses/Orbits: Paranasal sinuses and mastoid air cells are clear. The orbits are unremarkable. Other: None. CT CERVICAL SPINE FINDINGS Alignment: Normal. Skull base and vertebrae: Pannus at the C1-C2 level with associated erosive changes of the skull base/anterior foramen magnum. Multilevel degenerative changes of the spine. Lytic lesion along the base of the dens of unclear etiology. No acute fracture. No aggressive appearing focal osseous lesion or focal pathologic process. Soft tissues and spinal canal: No prevertebral fluid or swelling. No visible canal hematoma. Upper chest: Unremarkable. Other: None. IMPRESSION: 1. In 1.9 x 1.2 x 2 cm left inferior frontal lobe lesion with surrounding vasogenic edema. Recommend MRI brain with and without contrast for  further evaluation. 2. No acute displaced fracture or traumatic listhesis of the cervical spine. 3. Lytic lesion along the base of the dens of unclear etiology. Electronically Signed   By: Iven Finn M.D.   On: 12/30/2021 20:22   CT Cervical Spine Wo Contrast  Result Date: 12/30/2021 CLINICAL DATA:  Status post fall EXAM: CT HEAD WITHOUT CONTRAST CT CERVICAL SPINE WITHOUT CONTRAST TECHNIQUE: Multidetector CT imaging of the head and cervical spine was performed following the standard protocol without intravenous contrast. Multiplanar CT image reconstructions of the cervical spine were also generated. COMPARISON:  MRI head 07/13/2018 FINDINGS: CT HEAD FINDINGS BRAIN: BRAIN Patchy and confluent areas of decreased attenuation are noted throughout the deep and periventricular white matter of the cerebral hemispheres bilaterally, compatible with chronic microvascular ischemic disease. No evidence of large-territorial acute infarction. No parenchymal hemorrhage. Suggestion of a 1.9 x 1.2 x 2cm left inferior frontal lobe lesion with surrounding vasogenic edema. No extra-axial collection. No mass effect or midline shift. No hydrocephalus. Basilar cisterns are patent.  Vascular: No hyperdense vessel. Skull: No acute fracture or focal lesion. Sinuses/Orbits: Paranasal sinuses and mastoid air cells are clear. The orbits are unremarkable. Other: None. CT CERVICAL SPINE FINDINGS Alignment: Normal. Skull base and vertebrae: Pannus at the C1-C2 level with associated erosive changes of the skull base/anterior foramen magnum. Multilevel degenerative changes of the spine. Lytic lesion along the base of the dens of unclear etiology. No acute fracture. No aggressive appearing focal osseous lesion or focal pathologic process. Soft tissues and spinal canal: No prevertebral fluid or swelling. No visible canal hematoma. Upper chest: Unremarkable. Other: None. IMPRESSION: 1. In 1.9 x 1.2 x 2 cm left inferior frontal lobe lesion with  surrounding vasogenic edema. Recommend MRI brain with and without contrast for further evaluation. 2. No acute displaced fracture or traumatic listhesis of the cervical spine. 3. Lytic lesion along the base of the dens of unclear etiology. Electronically Signed   By: Iven Finn M.D.   On: 12/30/2021 20:22   CT PELVIS WO CONTRAST  Result Date: 12/30/2021 CLINICAL DATA:  Hip trauma, fracture suspected, xray done 3 status post fall EXAM: CT PELVIS WITHOUT CONTRAST TECHNIQUE: Multidetector CT imaging of the pelvis was performed following the standard protocol without intravenous contrast. COMPARISON:  X-ray bilateral hips 12/30/2021 FINDINGS: Urinary Tract:  No abnormality visualized. Bowel: Scattered colonic diverticulosis. Otherwise unremarkable visualized pelvic bowel loops. Vascular/Lymphatic: Atherosclerotic plaque. No pathologically enlarged lymph nodes. No significant vascular abnormality seen. Reproductive:  No mass or other significant abnormality Other: No intraperitoneal free fluid. No intraperitoneal free gas. No organized fluid collection. Musculoskeletal: No large hematoma formation. Mild subcutaneus soft tissue edema along the left hip. No acute displaced fracture of the hips or pelvis. No pelvic bone diastasis. Degenerative changes of visualized lower lumbar spine. Lucency of the left acetabulum (6:50 2-56) likely represents a nutrient vessel. At least mild degenerative changes of bilateral hips. Old healed coccyx fracture with fusion to the S5 vertebral body. IMPRESSION: 1. Negative for acute traumatic injury. 2.  Aortic Atherosclerosis (ICD10-I70.0). Electronically Signed   By: Iven Finn M.D.   On: 12/30/2021 21:43   MR BRAIN WO CONTRAST  Result Date: 12/31/2021 CLINICAL DATA:  Follow-up examination for brain lesion. EXAM: MRI HEAD WITHOUT CONTRAST TECHNIQUE: Multiplanar, multiecho pulse sequences of the brain and surrounding structures were obtained without intravenous contrast.  COMPARISON:  Prior MRI from 12/30/2021. FINDINGS: Examination was performed as a re-attempt at obtaining postcontrast images of the previously identified left frontal lobe abnormality. Unfortunately, the patient was unable to tolerate the exam, with no postcontrast imaging performed. Axial SWI and T1 weighted sequences only were performed. Additionally, the provided images are moderately degraded by motion artifact. Given the technical limitations, no new findings are seen within the brain. The left frontal lobe lesion is again not well seen and incompletely assessed. IMPRESSION: Technically limited exam due to the patient's inability to tolerate the exam. Unfortunately, postcontrast images of the brain were again unable to be obtained. No new findings identified. Electronically Signed   By: Jeannine Boga M.D.   On: 12/31/2021 03:16   MR BRAIN WO CONTRAST  Result Date: 12/30/2021 CLINICAL DATA:  Follow-up examination for abnormal CT, possible intracranial mass. EXAM: MRI HEAD WITHOUT CONTRAST TECHNIQUE: Multiplanar, multiecho pulse sequences of the brain and surrounding structures were obtained without intravenous contrast. COMPARISON:  Prior CT from earlier the same day as well as previous MRI from 07/13/2018. FINDINGS: Brain: Examination technically limited as the patient was unable to tolerate the full length of the  exam, postcontrast sequences were not performed. Additionally, images provided are moderately to severely intermittently degraded by motion artifact. Diffuse prominence of the CSF containing spaces compatible generalized cerebral atrophy. Patchy and confluent T2/FLAIR hyperintensity involving the periventricular deep white matter both cerebral hemispheres most consistent with chronic small vessel ischemic disease, moderate in nature. Small remote right cerebellar infarct noted. There is suggestion of a focal intraparenchymal abnormality centered at the anterior/inferior left frontal lobe at  the left gyrus rectus, corresponding with abnormality on prior CT. This measures approximately 2.3 x 1.3 cm (series 16, image 16). Surrounding T2/FLAIR signal abnormality consistent with vasogenic edema. Edema extends along the genu of the corpus callosum, crossing the midline to involve the contralateral right frontal lobe (series 17, image 17). No definite intrinsic T1 hyperintensity. No visible associated susceptibility artifact, although evaluation markedly limited by motion. Finding is indeterminate, and could reflect an intraparenchymal mass, possibly a primary CNS neoplasm or solitary intracranial metastasis. A possible subacute hematoma is also considered, although evaluation limited given artifact on this exam. No other visible mass lesion, mass effect, or midline shift. No hydrocephalus or extra-axial fluid collection. No other foci of diffusion abnormality to suggest acute or subacute ischemia. Gray-white matter differentiation otherwise maintained. Pituitary gland suprasellar region normal. Midline structures intact. Vascular: Major intracranial vascular flow voids are grossly maintained at the skull base. Skull and upper cervical spine: Prominent degenerative thickening/pannus formation noted at the tectorial membrane and dens. Craniocervical junction otherwise unremarkable. Bone marrow signal intensity within normal limits. No focal marrow replacing lesion. Hyperostosis frontalis interna noted. Prior right retrosigmoid craniotomy noted. No acute scalp soft tissue abnormality. Sinuses/Orbits: Patient status post bilateral ocular lens replacement. Scattered mucosal thickening noted within the ethmoidal air cells. Paranasal sinuses are otherwise clear. No mastoid effusion. Other: None. IMPRESSION: 1. Technically limited exam due to motion artifact and the patient's inability to tolerate the full length of the study. Post-contrast sequences were not performed. 2. Approximate 2.3 x 1.3 cm intraparenchymal  abnormality centered at the anterior/inferior left frontal lobe, corresponding with abnormality on prior CT. Surrounding vasogenic edema without midline shift. Finding is indeterminate, and could reflect an intraparenchymal mass, possibly a primary CNS neoplasm or solitary intracranial metastasis. A possible evolving subacute hematoma is also considered. Further assessment with postcontrast imaging as the patient is able to tolerate is recommended. Additionally, a short interval follow-up MRI to evaluate for evolutionary changes may be helpful as well. 3. No other acute intracranial abnormality. 4. Underlying atrophy with moderate chronic small vessel ischemic disease. Electronically Signed   By: Jeannine Boga M.D.   On: 12/30/2021 23:25   MR BRAIN W WO CONTRAST  Result Date: 01/01/2022 CLINICAL DATA:  Left frontal mass EXAM: MRI HEAD WITHOUT AND WITH CONTRAST TECHNIQUE: Multiplanar, multiecho pulse sequences of the brain and surrounding structures were obtained without and with intravenous contrast. CONTRAST:  44mL GADAVIST GADOBUTROL 1 MMOL/ML IV SOLN COMPARISON:  Brain MRI 12/30/2021, CT head 12/30/2021, brain MRI 07/13/2018 FINDINGS: Brain: There is masslike FLAIR signal abnormality in the left anterior frontal lobe. There is homogeneous enhancement within the area of signal abnormality measuring 1.8 cm AP by 1.4 cm TV by 1.1 cm cc. There is faint diffusion restriction within this area of enhancement. FLAIR signal abnormality surrounding the enhancement is seen in the adjacent left frontal lobe subcortical white matter as well as crossing the midline via the genu of the corpus callosum into the right anterior frontal lobe. No other convincing enhancement is seen within the areas of  FLAIR signal abnormality. There is no SWI signal dropout to suggest hemorrhage. There is no cystic change. Background of mild parenchymal volume loss and chronic white matter microangiopathy is unchanged. Sulcal FLAIR  hyperintensity overlying the occipital lobes is favored to be artifactual. There is partial effacement of the left frontal horn by the above-described mass. The ventricles are otherwise normal in size. There is no other abnormal enhancement.  There is no midline shift. Vascular: Normal flow voids. Skull and upper cervical spine: Normal marrow signal. Sinuses/Orbits: The paranasal sinuses are clear. Bilateral lens implants are in place. The globes and orbits are otherwise unremarkable. Other: None. IMPRESSION: Homogeneously enhancing lesion in the left frontal lobe with surrounding edema and/or infiltrative tumor which crosses midline via the genu of the corpus callosum described above. 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 Electronically Signed   By: Valetta Mole M.D.   On: 01/01/2022 10:06   MR CERVICAL SPINE WO CONTRAST  Result Date: 12/31/2021 CLINICAL DATA:  Initial evaluation for possible bone lesion. EXAM: MRI CERVICAL SPINE WITHOUT CONTRAST TECHNIQUE: Multiplanar, multisequence MR imaging of the cervical spine was performed. No intravenous contrast was administered. COMPARISON:  Prior CT from 12/30/2021. FINDINGS: Alignment: Examination degraded by motion artifact. Additionally, patient was unable to tolerate the full length of the exam. No postcontrast images were performed. Vertebral bodies normally aligned with preservation of the normal cervical lordosis. No listhesis. Vertebrae: Vertebral body height maintained without acute or chronic fracture. Bone marrow signal intensity within normal limits. 6 mm T1/T2 hyperintense lesion seen at the base of the dens, consistent with a small benign hemangioma. This corresponds with lesion noted on prior CT. No other worrisome osseous lesions seen elsewhere within the cervical spine. No abnormal marrow edema. Cord: Normal signal and morphology. Posterior Fossa, vertebral arteries, paraspinal  tissues: Visualized brain and posterior fossa within normal limits. Degenerative thickening about the tectorial membrane with pannus formation noted. No significant stenosis at the craniocervical junction. Paraspinous and prevertebral soft tissues within normal limits. Normal flow voids seen within the vertebral arteries bilaterally. Few small thyroid nodules noted, largest of which measures 12 mm on the right, of doubtful significance given size and patient age, no follow-up imaging recommended (ref: J Am Coll Radiol. 2015 Feb;12(2): 143-50). Disc levels: C2-C3: Shallow left eccentric disc osteophyte complex mildly flattens the ventral thecal sac. Minimal flattening of the left ventral cord without cord signal changes. Mild ligamentum flavum hypertrophy. Resultant mild spinal stenosis. Foramina remain patent. C3-C4: Small central disc protrusion indents the ventral thecal sac (series 13, image 22). No more than mild spinal stenosis without frank cord impingement. Left-sided uncovertebral and facet hypertrophy with resultant mild to moderate left C4 foraminal stenosis. Right neural foramen remains patent. C4-C5: Right paracentral disc protrusion indents the right ventral thecal sac (series 13, image 27). Minimal flattening of the ventral cord without cord signal changes. Mild spinal stenosis. Superimposed uncovertebral and facet hypertrophy with resultant moderate left C5 foraminal narrowing. Right neural foramina remains patent. C5-C6: Small central disc protrusion indents the ventral thecal sac. No significant spinal stenosis or cord deformity. Superimposed mild facet hypertrophy. Foramina remain patent. C6-C7: Anterior endplate spurring without significant disc bulge. Mild facet hypertrophy. No stenosis. C7-T1:  Negative interspace.  Mild facet hypertrophy.  No stenosis. Visualized upper thoracic spine demonstrates no significant finding. IMPRESSION: 1. 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 No other worrisome osseous  lesions within the cervical spine. 2. 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 3. Multifactorial degenerative changes with resultant mild to moderate left C4 and C5 foraminal stenosis. Electronically Signed   By: Jeannine Boga M.D.   On: 12/31/2021 03:10   DG Hip Unilat W or Wo Pelvis 2-3 Views Left  Result Date: 12/30/2021 CLINICAL DATA:  fall, back/hip pain EXAM: DG HIP (WITH OR WITHOUT PELVIS) 2-3V RIGHT; DG HIP (WITH OR WITHOUT PELVIS) 2-3V LEFT COMPARISON:  None. FINDINGS: Markedly limited evaluation due to overlapping osseous structures and overlying soft tissues. There is no evidence of hip fracture or dislocation bilaterally. No acute displaced fracture or diastasis of the bones of the pelvis. Visualized lower lumbar spine demonstrates degenerative changes. Limited evaluation of the sacrum due to overlying bowel. There is no evidence of severe arthropathy or other focal bone abnormality. IMPRESSION: Negative for acute traumatic injury. Electronically Signed   By: Iven Finn M.D.   On: 12/30/2021 19:09   DG Hip Unilat W or Wo Pelvis 2-3 Views Right  Result Date: 12/30/2021 CLINICAL DATA:  fall, back/hip pain EXAM: DG HIP (WITH OR WITHOUT PELVIS) 2-3V RIGHT; DG HIP (WITH OR WITHOUT PELVIS) 2-3V LEFT COMPARISON:  None. FINDINGS: Markedly limited evaluation due to overlapping osseous structures and overlying soft tissues. There is no evidence of hip fracture or dislocation bilaterally. No acute displaced fracture or diastasis of the bones of the pelvis. Visualized lower lumbar spine demonstrates degenerative changes. Limited evaluation of the sacrum due to overlying bowel. There is no evidence of severe arthropathy or other focal bone abnormality. IMPRESSION: Negative for acute traumatic injury. Electronically Signed   By: Iven Finn M.D.   On: 12/30/2021 19:09   CT BRAINLAB  HEAD W/O CONTRAST (1MM)  Result Date: 01/01/2022 CLINICAL DATA:  BrainLAB CT for operative planning. EXAM: CT HEAD WITHOUT CONTRAST TECHNIQUE: Contiguous axial images were obtained from the base of the skull through the vertex without intravenous contrast. COMPARISON:  12/30/2021 CT head, correlation is also made with MRI brain 01/01/2022 FINDINGS: Brain: Redemonstrated 2.1 x 1.1 x 2.0 cm left inferior frontal lobe lesion with surrounding vasogenic edema (series 3, image 104), unchanged when remeasured similarly. Mild mass effect on the left frontal horn. No hydrocephalus or extra-axial collection. No acute infarct, hemorrhage, or midline shift. Hyperdense lesion along the right aspect of the tentorium adjacent to the pons, which measures 6 x 9 x 4 mm (series 3, image 65 and series 6, image 69), present on the prior CT but not well seen likely secondary to volume averaging. This correlates with a mildly enhancing lesion on the prior MRI (series 10, image 18). Vascular: No hyperdense vessel. Atherosclerotic calcifications in the intracranial carotid and vertebral arteries. Skull: Hyperostosis frontalis. Prior right suboccipital craniotomy. Partially imaged lucent lesions in the body and posterior elements of C2. No acute fracture. Sinuses/Orbits: No acute finding. Status post bilateral lens replacements. Other: Trace fluid in right mastoid air cells. IMPRESSION: 1. Redemonstrated 2.1 cm left inferior frontal lobe lesion with surrounding vasogenic edema, not significantly changed from prior exam. 2. 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 Electronically Signed   By: Merilyn Baba M.D.   On: 01/01/2022 18:34     Subjective: Feels well, got up to bathroom then to chair this morning without LOB. No HA, no changes in  vision, hearing, speech. No numbness or weakness. Wants  to go home very badly!  Discharge Exam: Vitals:   01/03/22 0800 01/03/22 0900  BP:  (!) 114/51  Pulse:    Resp: 19 18  Temp: 98.2 F (36.8 C)   SpO2:     General: Pt is alert, awake, not in acute distress Cardiovascular: RRR, S1/S2 +, no rubs, no gallops Respiratory: CTA bilaterally, no wheezing, no rhonchi Abdominal: Soft, NT, ND, bowel sounds + Extremities: No edema, no cyanosis  Labs: BNP (last 3 results) No results for input(s): BNP in the last 8760 hours. Basic Metabolic Panel: Recent Labs  Lab 12/30/21 1732 12/31/21 0725  NA 136 137  K 3.6 3.9  CL 102 108  CO2 24 23  GLUCOSE 152* 104*  BUN 13 11  CREATININE 0.82 0.68  CALCIUM 8.6* 7.1*  MG  --  1.6*   Liver Function Tests: Recent Labs  Lab 12/30/21 1732  AST 31  ALT 38  ALKPHOS 59  BILITOT 1.1  PROT 6.3*  ALBUMIN 3.6   No results for input(s): LIPASE, AMYLASE in the last 168 hours. No results for input(s): AMMONIA in the last 168 hours. CBC: Recent Labs  Lab 12/30/21 1732 12/31/21 0725  WBC 11.7* 7.1  NEUTROABS 10.2*  --   HGB 13.6 11.2*  HCT 43.6 35.4*  MCV 89.0 89.2  PLT 265 217   Cardiac Enzymes: Recent Labs  Lab 12/30/21 1732  CKTOTAL 105   BNP: Invalid input(s): POCBNP CBG: Recent Labs  Lab 12/30/21 1741 01/02/22 1452 01/02/22 1643  GLUCAP 148* 107* 109*   D-Dimer No results for input(s): DDIMER in the last 72 hours. Hgb A1c Recent Labs    01/01/22 1550  HGBA1C 5.9*   Lipid Profile No results for input(s): CHOL, HDL, LDLCALC, TRIG, CHOLHDL, LDLDIRECT in the last 72 hours. Thyroid function studies No results for input(s): TSH, T4TOTAL, T3FREE, THYROIDAB in the last 72 hours.  Invalid input(s): FREET3 Anemia work up No results for input(s): VITAMINB12, FOLATE, FERRITIN, TIBC, IRON, RETICCTPCT in the last 72 hours. Urinalysis No results found for: COLORURINE, APPEARANCEUR, LABSPEC, [place], GLUCOSEU, [place], [place], KETONESUR, PROTEINUR, UROBILINOGEN, NITRITE,  LEUKOCYTESUR  Microbiology Recent Results (from the past 240 hour(s))  Resp Panel by RT-PCR (Flu A&B, Covid) Nasopharyngeal Swab     Status: None   Collection Time: 12/30/21  5:28 PM   Specimen: Nasopharyngeal Swab; Nasopharyngeal(NP) swabs in vial transport medium  Result Value Ref Range Status   SARS Coronavirus 2 by RT PCR NEGATIVE NEGATIVE Final    Comment: (NOTE) SARS-CoV-2 target nucleic acids are NOT DETECTED.  The SARS-CoV-2 RNA is generally detectable in upper respiratory specimens during the acute phase of infection. The lowest concentration of SARS-CoV-2 viral copies this assay can detect is 138 copies/mL. A negative result does not preclude SARS-Cov-2 infection and should not be used as the sole basis for treatment or other patient management decisions. A negative result may occur with  improper specimen collection/handling, submission of specimen other than nasopharyngeal swab, presence of viral mutation(s) within the areas targeted by this assay, and inadequate number of viral copies(<138 copies/mL). A negative result must be combined with clinical observations, patient history, and epidemiological information. The expected result is Negative.  Fact Sheet for Patients:  EntrepreneurPulse.com.au  Fact Sheet for Healthcare Providers:  IncredibleEmployment.be  This test is no t yet approved or cleared by the [place] FDA and  has been authorized for detection and/or diagnosis of SARS-CoV-2 by FDA under an  Emergency Use Authorization (EUA). This EUA will remain  in effect (meaning this test can be used) for the duration of the COVID-19 declaration under Section 564(b)(1) of the Act, 21 U.S.C.section 360bbb-3(b)(1), unless the authorization is terminated  or revoked sooner.       Influenza A by PCR NEGATIVE NEGATIVE Final   Influenza B by PCR NEGATIVE NEGATIVE Final    Comment: (NOTE) The Xpert Xpress SARS-CoV-2/FLU/RSV plus  assay is intended as an aid in the diagnosis of influenza from Nasopharyngeal swab specimens and should not be used as a sole basis for treatment. Nasal washings and aspirates are unacceptable for Xpert Xpress SARS-CoV-2/FLU/RSV testing.  Fact Sheet for Patients: EntrepreneurPulse.com.au  Fact Sheet for Healthcare Providers: IncredibleEmployment.be  This test is not yet approved or cleared by the [place] FDA and has been authorized for detection and/or diagnosis of SARS-CoV-2 by FDA under an Emergency Use Authorization (EUA). This EUA will remain in effect (meaning this test can be used) for the duration of the COVID-19 declaration under Section 564(b)(1) of the Act, 21 U.S.C. section 360bbb-3(b)(1), unless the authorization is terminated or revoked.  Performed at [facility], [place] [address], [place], [place] [address]   MRSA Next Gen by PCR, Nasal     Status: None   Collection Time: 01/02/22  7:40 PM   Specimen: Nasal Mucosa; Nasal Swab  Result Value Ref Range Status   MRSA by PCR Next Gen NOT DETECTED NOT DETECTED Final    Comment: (NOTE) The GeneXpert MRSA Assay (FDA approved for NASAL specimens only), is one component of a comprehensive MRSA colonization surveillance program. It is not intended to diagnose MRSA infection nor to guide or monitor treatment for MRSA infections. Test performance is not FDA approved in patients less than 47 years old. Performed at [facility], [place] [address], [place], [place] [address]     Time coordinating discharge: Approximately 40 minutes  Patrecia Pour, MD  [facility] 01/03/2022, 9:51 AM

## 2022-01-06 NOTE — Anesthesia Postprocedure Evaluation (Signed)
Anesthesia Post Note  Patient: Isabel Gibbs  Procedure(s) Performed: LEFT BRAIN  BIOPSY WITH BRAINLAB (Left) APPLICATION OF CRANIAL NAVIGATION (Left)     Patient location during evaluation: PACU Anesthesia Type: General Level of consciousness: patient cooperative and awake Pain management: pain level controlled Vital Signs Assessment: post-procedure vital signs reviewed and stable Respiratory status: spontaneous breathing, nonlabored ventilation, respiratory function stable and patient connected to nasal cannula oxygen Cardiovascular status: blood pressure returned to baseline and stable Postop Assessment: no apparent nausea or vomiting Anesthetic complications: no   No notable events documented.  Last Vitals:  Vitals:   01/03/22 0800 01/03/22 0900  BP:  (!) 114/51  Pulse:    Resp: 19 18  Temp: 36.8 C   SpO2:      Last Pain:  Vitals:   01/03/22 0800  TempSrc: Oral  PainSc:                  Tramond Slinker

## 2022-01-06 NOTE — Anesthesia Preprocedure Evaluation (Signed)
Anesthesia Evaluation  Patient identified by MRN, date of birth, ID band Patient awake    Reviewed: Allergy & Precautions, NPO status , Patient's Chart, lab work & pertinent test results  Airway Mallampati: II  TM Distance: >3 FB Neck ROM: Full    Dental  (+) Dental Advisory Given   Pulmonary neg pulmonary ROS, former smoker,    breath sounds clear to auscultation       Cardiovascular hypertension,  Rhythm:Regular     Neuro/Psych brain CNS neoplasium  Neuromuscular disease    GI/Hepatic negative GI ROS, Neg liver ROS,   Endo/Other  negative endocrine ROS  Renal/GU negative Renal ROS     Musculoskeletal   Abdominal   Peds  Hematology   Anesthesia Other Findings   Reproductive/Obstetrics                             Anesthesia Physical Anesthesia Plan  ASA: 3  Anesthesia Plan: General   Post-op Pain Management:    Induction: Intravenous  PONV Risk Score and Plan: 3 and Ondansetron and Dexamethasone  Airway Management Planned: Oral ETT  Additional Equipment: None  Intra-op Plan:   Post-operative Plan: Extubation in OR  Informed Consent: I have reviewed the patients History and Physical, chart, labs and discussed the procedure including the risks, benefits and alternatives for the proposed anesthesia with the patient or authorized representative who has indicated his/her understanding and acceptance.     Dental advisory given  Plan Discussed with: CRNA and Anesthesiologist  Anesthesia Plan Comments:         Anesthesia Quick Evaluation

## 2022-01-08 LAB — SURGICAL PATHOLOGY

## 2022-01-13 ENCOUNTER — Telehealth: Payer: Self-pay | Admitting: Internal Medicine

## 2022-01-13 ENCOUNTER — Other Ambulatory Visit: Payer: Self-pay | Admitting: Radiation Therapy

## 2022-01-13 DIAGNOSIS — C8510 Unspecified B-cell lymphoma, unspecified site: Secondary | ICD-10-CM

## 2022-01-13 NOTE — Telephone Encounter (Signed)
Scheduled appt per 1/16 referral. Pt is aware of appt date and time. Pt is aware to arrive 15 mins prior to appt time.

## 2022-01-27 ENCOUNTER — Other Ambulatory Visit: Payer: Self-pay

## 2022-01-27 ENCOUNTER — Inpatient Hospital Stay: Payer: Medicare Other | Attending: Internal Medicine | Admitting: Internal Medicine

## 2022-01-27 DIAGNOSIS — Z79899 Other long term (current) drug therapy: Secondary | ICD-10-CM | POA: Diagnosis not present

## 2022-01-27 DIAGNOSIS — Z993 Dependence on wheelchair: Secondary | ICD-10-CM | POA: Insufficient documentation

## 2022-01-27 DIAGNOSIS — C8339 Primary central nervous system lymphoma: Secondary | ICD-10-CM | POA: Insufficient documentation

## 2022-01-27 DIAGNOSIS — Z87891 Personal history of nicotine dependence: Secondary | ICD-10-CM | POA: Diagnosis not present

## 2022-01-27 DIAGNOSIS — C8589 Other specified types of non-Hodgkin lymphoma, extranodal and solid organ sites: Secondary | ICD-10-CM

## 2022-01-27 MED ORDER — DEXAMETHASONE 4 MG PO TABS: 4.0000 mg | ORAL_TABLET | Freq: Two times a day (BID) | ORAL | 1 refills | Status: DC

## 2022-01-27 NOTE — Progress Notes (Signed)
[facility] [place], [place] [address] [phone]   New Patient Evaluation  Date of Service: 01/27/22 Patient Name: Isabel Gibbs Patient MRN: [id number] Patient DOB: December 16, 1946 Provider: Ventura Sellers, MD  Identifying Statement:  Isabel Gibbs is a 76 y.o. female with left frontal  CNS lymphoma  who presents for initial consultation and evaluation.    Referring Provider: Karleen Hampshire., MD [phone] [address],  [place] [address]  Oncologic History: Oncology History  CNS lymphoma [facility])  01/02/2022 Cancer Staging   Staging form: Brain and Spinal Cord, AJCC Version 9 - Clinical stage from 01/02/2022: WHO G4 - Signed by Ventura Sellers, MD on 01/27/2022 Histopathologic type: Malignant lymphoma, large B-cell, diffuse, NOS Stage prefix: Initial diagnosis Histologic grading system: 4 grade system    01/27/2022 Initial Diagnosis   CNS lymphoma (HCC)     Biomarkers:  MGMT Unknown.  IDH 1/2 Unknown.  EGFR Unknown  CD20 positive   History of Present Illness: The patient's records from the referring physician were obtained and reviewed and the patient interviewed to confirm this HPI.  Isabel Gibbs presented to medical attention earlier this month after a fall at home, injuring her hip.  She underwent full body scan under trauma protocol, which identified an enhancing lesion in the left frontal lobe consistent with likely primary brain tumor.  Stereotactic biopsy was performed by Dr. Zada Finders on 01/02/22; path demonstrated CD20+ B-cell lymphoma.  Since surgery, she has experienced decline in functional status.  She is now relying on a walker or wheelchair due to worsening right sided weakness, arm and leg.  There is increased difficulty with language, expression, finding words.  Additionally, she is very fatigued and sleeps often throughout the day.  Currently cared for by her sons, here at bedside  today.   Medications: Current Outpatient Medications on File Prior to Visit  Medication Sig Dispense Refill   acetaminophen (TYLENOL) 500 MG tablet Take 500-1,000 mg by mouth 2 (two) times daily as needed for mild pain.     alendronate (FOSAMAX) 70 MG tablet Take 70 mg by mouth every Friday.     gabapentin (NEURONTIN) 300 MG capsule Take 900 mg by mouth See admin instructions. Take 900 mg by mouth in the evening and at bedtime     irbesartan (AVAPRO) 150 MG tablet Take 150 mg by mouth at bedtime.     SYSTANE ULTRA PF 0.4-0.3 % SOLN Place 1 drop into both eyes 3 (three) times daily as needed (for dryness).     traMADol (ULTRAM) 50 MG tablet Take 50 mg by mouth See admin instructions. Take 50 mg by mouth in the morning and evening     Vitamin D, Ergocalciferol, (DRISDOL) 1.25 MG (50000 UNIT) CAPS capsule Take 50,000 Units by mouth every Wednesday.     VOLTAREN 1 % GEL Apply 2 g topically 4 (four) times daily as needed (for bilateral knee pain).     No current facility-administered medications on file prior to visit.    Allergies:  Allergies  Allergen Reactions   Codeine Other (See Comments)    Severe GI upset    Lisinopril Cough        Hydrocodone-Acetaminophen Nausea Only   Past Medical History:  Past Medical History:  Diagnosis Date   Hypertension    Prediabetes    Trigeminal neuralgia of right side of face    Past Surgical History:  Past Surgical History:  Procedure Laterality Date   APPLICATION OF CRANIAL NAVIGATION Left 01/02/2022   Procedure: APPLICATION OF CRANIAL NAVIGATION;  Surgeon: Judith Part, MD;  Location: [place];  Service: Neurosurgery;  Laterality: Left;   CEREBRAL MICROVASCULAR DECOMPRESSION Right 08/02/2018   [facility]   FRAMELESS  BIOPSY WITH BRAINLAB Left 01/02/2022   Procedure: LEFT BRAIN  BIOPSY WITH Lucky Rathke;  Surgeon: Judith Part, MD;  Location: [place];  Service: Neurosurgery;  Laterality: Left;   RADIOLOGY WITH ANESTHESIA N/A 01/01/2022    Procedure: MRI of Brain with and without contrast;  Surgeon: Radiologist, Medication, MD;  Location: [place];  Service: Radiology;  Laterality: N/A;   Social History:  Social History   Socioeconomic History   Marital status: Widowed    Spouse name: Not on file   Number of children: Not on file   Years of education: Not on file   Highest education level: Not on file  Occupational History   Not on file  Tobacco Use   Smoking status: Former    Types: Cigarettes    Quit date: 2000    Years since quitting: 23.0   Smokeless tobacco: Never  Substance and Sexual Activity   Alcohol use: Not on file   Drug use: Not on file   Sexual activity: Not on file  Other Topics Concern   Not on file  Social History Narrative   Not on file   Social Determinants of Health   Financial Resource Strain: Not on file  Food Insecurity: Not on file  Transportation Needs: Not on file  Physical Activity: Not on file  Stress: Not on file  Social Connections: Not on file  Intimate Partner Violence: Not on file   Family History:  Family History  Problem Relation Age of Onset   Heart failure Brother    Diabetes Brother    Scientist, research (medical) Parkinson White syndrome Son     Review of Systems: Constitutional: Doesn't report fevers, chills or abnormal weight loss Eyes: Doesn't report blurriness of vision Ears, nose, mouth, throat, and face: Doesn't report sore throat Respiratory: Doesn't report cough, dyspnea or wheezes Cardiovascular: Doesn't report palpitation, chest discomfort  Gastrointestinal:  Doesn't report nausea, constipation, diarrhea GU: Doesn't report incontinence Skin: Doesn't report skin rashes Neurological: Per HPI Musculoskeletal: Doesn't report joint pain Behavioral/Psych: Doesn't report anxiety  Physical Exam: Vitals:   01/27/22 0938  BP: (!) 136/56  Pulse: 75  Resp: 18  SpO2: 98%   KPS: 60. General: Alert, cooperative, pleasant, in no acute distress Head: Normal EENT: No  conjunctival injection or scleral icterus.  Lungs: Resp effort normal Cardiac: Regular rate Abdomen: Non-distended abdomen Skin: No rashes cyanosis or petechiae. Extremities: No clubbing or edema  Neurologic Exam: Mental Status: Awake, but drowsy, attentive to examiner only with stimulation. Oriented to self and environment. Language is impaired with regards to fluency, comprehension intact to multi step.  Cranial Nerves: Visual acuity is grossly normal. Visual fields are full. Extra-ocular movements intact. No ptosis. Face is symmetric Motor: Tone and bulk are normal. Power is noted 4/5 in right arm and leg. Reflexes are symmetric, no pathologic reflexes present.  Sensory: Intact to light touch Gait: Hemiparetic   Labs: I have reviewed the data as listed    Component Value Date/Time   NA 137 12/31/2021 0725   K 3.9 12/31/2021 0725   CL 108 12/31/2021 0725   CO2 23 12/31/2021 0725   GLUCOSE 104 (H) 12/31/2021 0725   BUN 11 12/31/2021 0725   CREATININE 0.68  12/31/2021 0725   CALCIUM 7.1 (L) 12/31/2021 0725   PROT 6.3 (L) 12/30/2021 1732   ALBUMIN 3.6 12/30/2021 1732   AST 31 12/30/2021 1732   ALT 38 12/30/2021 1732   ALKPHOS 59 12/30/2021 1732   BILITOT 1.1 12/30/2021 1732   GFRNONAA >60 12/31/2021 0725   Lab Results  Component Value Date   WBC 7.1 12/31/2021   NEUTROABS 10.2 (H) 12/30/2021   HGB 11.2 (L) 12/31/2021   HCT 35.4 (L) 12/31/2021   MCV 89.2 12/31/2021   PLT 217 12/31/2021    Imaging:  DG Chest 2 View  Result Date: 12/30/2021 CLINICAL DATA:  Fall with back pain. EXAM: CHEST - 2 VIEW COMPARISON:  Chest x-ray 02/23/2017. FINDINGS: The heart size and mediastinal contours are within normal limits. Both lungs are clear. The visualized skeletal structures are unremarkable. There are surgical clips in the upper abdomen. IMPRESSION: No active cardiopulmonary disease. Electronically Signed   By: Ronney Asters M.D.   On: 12/30/2021 19:10   DG Scapula Right  Result  Date: 12/30/2021 CLINICAL DATA:  fall, back/hip pain EXAM: RIGHT SCAPULA - 2+ VIEWS COMPARISON:  None. FINDINGS: There is no evidence of fracture or other focal bone lesions. Degenerative changes of the right acromioclavicular joint. Soft tissues are unremarkable. IMPRESSION: Negative. Electronically Signed   By: Iven Finn M.D.   On: 12/30/2021 19:08   CT HEAD WO CONTRAST (5MM)  Addendum Date: 01/03/2022   ADDENDUM REPORT: 01/03/2022 00:27 ADDENDUM: Findings were discussed with Dr. Ellene Route at 12:25 a.m. Electronically Signed   By: Brett Fairy M.D.   On: 01/03/2022 00:27   Result Date: 01/03/2022 CLINICAL DATA:  Brain/CNS neoplasm. Assess treatment response. Status post stereotactic brain biopsy. EXAM: CT HEAD WITHOUT CONTRAST TECHNIQUE: Contiguous axial images were obtained from the base of the skull through the vertex without intravenous contrast. COMPARISON:  01/01/2022. FINDINGS: Brain: There is a mass measuring 2 cm containing small focus of hemorrhage and air in the inferior aspect of the frontal lobe on the left measuring 1.1 x 0.9 cm. Mild surrounding vasogenic edema is noted resulting in mild mass effect. No significant midline shift. There is no hydrocephalus. A stable hyperdense lesion is noted along the tentorium on the right 1.0 x 0.6 cm. Vascular: No hyperdense vessel or unexpected calcification. Skull: Craniotomy changes are noted in the occipital region on the right, unchanged from the previous exam. The calvarium is otherwise intact. Sinuses/Orbits: No acute finding. Other: None. IMPRESSION: 1. Status post stereotactic biopsy of mass in the inferior aspect of the frontal lobe on the left. There is a small focus of hemorrhage and air within the lesion measuring 1.1 x 0.9 cm. Surrounding vasogenic edema is unchanged. 2. Essentially stable hyperdense lesion along the tentorium on the right adjacent to the pons, possible meningioma. Electronically Signed: By: Brett Fairy M.D. On: 01/02/2022  23:34   CT Head Wo Contrast  Result Date: 12/30/2021 CLINICAL DATA:  Status post fall EXAM: CT HEAD WITHOUT CONTRAST CT CERVICAL SPINE WITHOUT CONTRAST TECHNIQUE: Multidetector CT imaging of the head and cervical spine was performed following the standard protocol without intravenous contrast. Multiplanar CT image reconstructions of the cervical spine were also generated. COMPARISON:  MRI head 07/13/2018 FINDINGS: CT HEAD FINDINGS BRAIN: BRAIN Patchy and confluent areas of decreased attenuation are noted throughout the deep and periventricular white matter of the cerebral hemispheres bilaterally, compatible with chronic microvascular ischemic disease. No evidence of large-territorial acute infarction. No parenchymal hemorrhage. Suggestion of a 1.9 x 1.2  x 2cm left inferior frontal lobe lesion with surrounding vasogenic edema. No extra-axial collection. No mass effect or midline shift. No hydrocephalus. Basilar cisterns are patent. Vascular: No hyperdense vessel. Skull: No acute fracture or focal lesion. Sinuses/Orbits: Paranasal sinuses and mastoid air cells are clear. The orbits are unremarkable. Other: None. CT CERVICAL SPINE FINDINGS Alignment: Normal. Skull base and vertebrae: Pannus at the C1-C2 level with associated erosive changes of the skull base/anterior foramen magnum. Multilevel degenerative changes of the spine. Lytic lesion along the base of the dens of unclear etiology. No acute fracture. No aggressive appearing focal osseous lesion or focal pathologic process. Soft tissues and spinal canal: No prevertebral fluid or swelling. No visible canal hematoma. Upper chest: Unremarkable. Other: None. IMPRESSION: 1. In 1.9 x 1.2 x 2 cm left inferior frontal lobe lesion with surrounding vasogenic edema. Recommend MRI brain with and without contrast for further evaluation. 2. No acute displaced fracture or traumatic listhesis of the cervical spine. 3. Lytic lesion along the base of the dens of unclear  etiology. Electronically Signed   By: Iven Finn M.D.   On: 12/30/2021 20:22   CT Cervical Spine Wo Contrast  Result Date: 12/30/2021 CLINICAL DATA:  Status post fall EXAM: CT HEAD WITHOUT CONTRAST CT CERVICAL SPINE WITHOUT CONTRAST TECHNIQUE: Multidetector CT imaging of the head and cervical spine was performed following the standard protocol without intravenous contrast. Multiplanar CT image reconstructions of the cervical spine were also generated. COMPARISON:  MRI head 07/13/2018 FINDINGS: CT HEAD FINDINGS BRAIN: BRAIN Patchy and confluent areas of decreased attenuation are noted throughout the deep and periventricular white matter of the cerebral hemispheres bilaterally, compatible with chronic microvascular ischemic disease. No evidence of large-territorial acute infarction. No parenchymal hemorrhage. Suggestion of a 1.9 x 1.2 x 2cm left inferior frontal lobe lesion with surrounding vasogenic edema. No extra-axial collection. No mass effect or midline shift. No hydrocephalus. Basilar cisterns are patent. Vascular: No hyperdense vessel. Skull: No acute fracture or focal lesion. Sinuses/Orbits: Paranasal sinuses and mastoid air cells are clear. The orbits are unremarkable. Other: None. CT CERVICAL SPINE FINDINGS Alignment: Normal. Skull base and vertebrae: Pannus at the C1-C2 level with associated erosive changes of the skull base/anterior foramen magnum. Multilevel degenerative changes of the spine. Lytic lesion along the base of the dens of unclear etiology. No acute fracture. No aggressive appearing focal osseous lesion or focal pathologic process. Soft tissues and spinal canal: No prevertebral fluid or swelling. No visible canal hematoma. Upper chest: Unremarkable. Other: None. IMPRESSION: 1. In 1.9 x 1.2 x 2 cm left inferior frontal lobe lesion with surrounding vasogenic edema. Recommend MRI brain with and without contrast for further evaluation. 2. No acute displaced fracture or traumatic listhesis  of the cervical spine. 3. Lytic lesion along the base of the dens of unclear etiology. Electronically Signed   By: Iven Finn M.D.   On: 12/30/2021 20:22   CT PELVIS WO CONTRAST  Result Date: 12/30/2021 CLINICAL DATA:  Hip trauma, fracture suspected, xray done 3 status post fall EXAM: CT PELVIS WITHOUT CONTRAST TECHNIQUE: Multidetector CT imaging of the pelvis was performed following the standard protocol without intravenous contrast. COMPARISON:  X-ray bilateral hips 12/30/2021 FINDINGS: Urinary Tract:  No abnormality visualized. Bowel: Scattered colonic diverticulosis. Otherwise unremarkable visualized pelvic bowel loops. Vascular/Lymphatic: Atherosclerotic plaque. No pathologically enlarged lymph nodes. No significant vascular abnormality seen. Reproductive:  No mass or other significant abnormality Other: No intraperitoneal free fluid. No intraperitoneal free gas. No organized fluid collection. Musculoskeletal: No large  hematoma formation. Mild subcutaneus soft tissue edema along the left hip. No acute displaced fracture of the hips or pelvis. No pelvic bone diastasis. Degenerative changes of visualized lower lumbar spine. Lucency of the left acetabulum (6:50 2-56) likely represents a nutrient vessel. At least mild degenerative changes of bilateral hips. Old healed coccyx fracture with fusion to the S5 vertebral body. IMPRESSION: 1. Negative for acute traumatic injury. 2.  Aortic Atherosclerosis (ICD10-I70.0). Electronically Signed   By: Iven Finn M.D.   On: 12/30/2021 21:43   MR BRAIN WO CONTRAST  Result Date: 12/31/2021 CLINICAL DATA:  Follow-up examination for brain lesion. EXAM: MRI HEAD WITHOUT CONTRAST TECHNIQUE: Multiplanar, multiecho pulse sequences of the brain and surrounding structures were obtained without intravenous contrast. COMPARISON:  Prior MRI from 12/30/2021. FINDINGS: Examination was performed as a re-attempt at obtaining postcontrast images of the previously identified left  frontal lobe abnormality. Unfortunately, the patient was unable to tolerate the exam, with no postcontrast imaging performed. Axial SWI and T1 weighted sequences only were performed. Additionally, the provided images are moderately degraded by motion artifact. Given the technical limitations, no new findings are seen within the brain. The left frontal lobe lesion is again not well seen and incompletely assessed. IMPRESSION: Technically limited exam due to the patient's inability to tolerate the exam. Unfortunately, postcontrast images of the brain were again unable to be obtained. No new findings identified. Electronically Signed   By: Jeannine Boga M.D.   On: 12/31/2021 03:16   MR BRAIN WO CONTRAST  Result Date: 12/30/2021 CLINICAL DATA:  Follow-up examination for abnormal CT, possible intracranial mass. EXAM: MRI HEAD WITHOUT CONTRAST TECHNIQUE: Multiplanar, multiecho pulse sequences of the brain and surrounding structures were obtained without intravenous contrast. COMPARISON:  Prior CT from earlier the same day as well as previous MRI from 07/13/2018. FINDINGS: Brain: Examination technically limited as the patient was unable to tolerate the full length of the exam, postcontrast sequences were not performed. Additionally, images provided are moderately to severely intermittently degraded by motion artifact. Diffuse prominence of the CSF containing spaces compatible generalized cerebral atrophy. Patchy and confluent T2/FLAIR hyperintensity involving the periventricular deep white matter both cerebral hemispheres most consistent with chronic small vessel ischemic disease, moderate in nature. Small remote right cerebellar infarct noted. There is suggestion of a focal intraparenchymal abnormality centered at the anterior/inferior left frontal lobe at the left gyrus rectus, corresponding with abnormality on prior CT. This measures approximately 2.3 x 1.3 cm (series 16, image 16). Surrounding T2/FLAIR signal  abnormality consistent with vasogenic edema. Edema extends along the genu of the corpus callosum, crossing the midline to involve the contralateral right frontal lobe (series 17, image 17). No definite intrinsic T1 hyperintensity. No visible associated susceptibility artifact, although evaluation markedly limited by motion. Finding is indeterminate, and could reflect an intraparenchymal mass, possibly a primary CNS neoplasm or solitary intracranial metastasis. A possible subacute hematoma is also considered, although evaluation limited given artifact on this exam. No other visible mass lesion, mass effect, or midline shift. No hydrocephalus or extra-axial fluid collection. No other foci of diffusion abnormality to suggest acute or subacute ischemia. Gray-white matter differentiation otherwise maintained. Pituitary gland suprasellar region normal. Midline structures intact. Vascular: Major intracranial vascular flow voids are grossly maintained at the skull base. Skull and upper cervical spine: Prominent degenerative thickening/pannus formation noted at the tectorial membrane and dens. Craniocervical junction otherwise unremarkable. Bone marrow signal intensity within normal limits. No focal marrow replacing lesion. Hyperostosis frontalis interna noted. Prior right retrosigmoid  craniotomy noted. No acute scalp soft tissue abnormality. Sinuses/Orbits: Patient status post bilateral ocular lens replacement. Scattered mucosal thickening noted within the ethmoidal air cells. Paranasal sinuses are otherwise clear. No mastoid effusion. Other: None. IMPRESSION: 1. Technically limited exam due to motion artifact and the patient's inability to tolerate the full length of the study. Post-contrast sequences were not performed. 2. Approximate 2.3 x 1.3 cm intraparenchymal abnormality centered at the anterior/inferior left frontal lobe, corresponding with abnormality on prior CT. Surrounding vasogenic edema without midline shift.  Finding is indeterminate, and could reflect an intraparenchymal mass, possibly a primary CNS neoplasm or solitary intracranial metastasis. A possible evolving subacute hematoma is also considered. Further assessment with postcontrast imaging as the patient is able to tolerate is recommended. Additionally, a short interval follow-up MRI to evaluate for evolutionary changes may be helpful as well. 3. No other acute intracranial abnormality. 4. Underlying atrophy with moderate chronic small vessel ischemic disease. Electronically Signed   By: Jeannine Boga M.D.   On: 12/30/2021 23:25   MR BRAIN W WO CONTRAST  Result Date: 01/01/2022 CLINICAL DATA:  Left frontal mass EXAM: MRI HEAD WITHOUT AND WITH CONTRAST TECHNIQUE: Multiplanar, multiecho pulse sequences of the brain and surrounding structures were obtained without and with intravenous contrast. CONTRAST:  78m GADAVIST GADOBUTROL 1 MMOL/ML IV SOLN COMPARISON:  Brain MRI 12/30/2021, CT head 12/30/2021, brain MRI 07/13/2018 FINDINGS: Brain: There is masslike FLAIR signal abnormality in the left anterior frontal lobe. There is homogeneous enhancement within the area of signal abnormality measuring 1.8 cm AP by 1.4 cm TV by 1.1 cm cc. There is faint diffusion restriction within this area of enhancement. FLAIR signal abnormality surrounding the enhancement is seen in the adjacent left frontal lobe subcortical white matter as well as crossing the midline via the genu of the corpus callosum into the right anterior frontal lobe. No other convincing enhancement is seen within the areas of FLAIR signal abnormality. There is no SWI signal dropout to suggest hemorrhage. There is no cystic change. Background of mild parenchymal volume loss and chronic white matter microangiopathy is unchanged. Sulcal FLAIR hyperintensity overlying the occipital lobes is favored to be artifactual. There is partial effacement of the left frontal horn by the above-described mass. The  ventricles are otherwise normal in size. There is no other abnormal enhancement.  There is no midline shift. Vascular: Normal flow voids. Skull and upper cervical spine: Normal marrow signal. Sinuses/Orbits: The paranasal sinuses are clear. Bilateral lens implants are in place. The globes and orbits are otherwise unremarkable. Other: None. IMPRESSION: Homogeneously enhancing lesion in the left frontal lobe with surrounding edema and/or infiltrative tumor which crosses midline via the genu of the corpus callosum described above. 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 Electronically Signed   By: PValetta MoleM.D.   On: 01/01/2022 10:06   MR CERVICAL SPINE WO CONTRAST  Result Date: 12/31/2021 CLINICAL DATA:  Initial evaluation for possible bone lesion. EXAM: MRI CERVICAL SPINE WITHOUT CONTRAST TECHNIQUE: Multiplanar, multisequence MR imaging of the cervical spine was performed. No intravenous contrast was administered. COMPARISON:  Prior CT from 12/30/2021. FINDINGS: Alignment: Examination degraded by motion artifact. Additionally, patient was unable to tolerate the full length of the exam. No postcontrast images were performed. Vertebral bodies normally aligned with preservation of the normal cervical lordosis. No listhesis. Vertebrae: Vertebral body height maintained without acute or chronic fracture. Bone marrow signal intensity within normal limits. 6 mm T1/T2 hyperintense lesion  seen at the base of the dens, consistent with a small benign hemangioma. This corresponds with lesion noted on prior CT. No other worrisome osseous lesions seen elsewhere within the cervical spine. No abnormal marrow edema. Cord: Normal signal and morphology. Posterior Fossa, vertebral arteries, paraspinal tissues: Visualized brain and posterior fossa within normal limits. Degenerative thickening about the tectorial membrane with pannus formation noted. No significant  stenosis at the craniocervical junction. Paraspinous and prevertebral soft tissues within normal limits. Normal flow voids seen within the vertebral arteries bilaterally. Few small thyroid nodules noted, largest of which measures 12 mm on the right, of doubtful significance given size and patient age, no follow-up imaging recommended (ref: J Am Coll Radiol. 2015 Feb;12(2): 143-50). Disc levels: C2-C3: Shallow left eccentric disc osteophyte complex mildly flattens the ventral thecal sac. Minimal flattening of the left ventral cord without cord signal changes. Mild ligamentum flavum hypertrophy. Resultant mild spinal stenosis. Foramina remain patent. C3-C4: Small central disc protrusion indents the ventral thecal sac (series 13, image 22). No more than mild spinal stenosis without frank cord impingement. Left-sided uncovertebral and facet hypertrophy with resultant mild to moderate left C4 foraminal stenosis. Right neural foramen remains patent. C4-C5: Right paracentral disc protrusion indents the right ventral thecal sac (series 13, image 27). Minimal flattening of the ventral cord without cord signal changes. Mild spinal stenosis. Superimposed uncovertebral and facet hypertrophy with resultant moderate left C5 foraminal narrowing. Right neural foramina remains patent. C5-C6: Small central disc protrusion indents the ventral thecal sac. No significant spinal stenosis or cord deformity. Superimposed mild facet hypertrophy. Foramina remain patent. C6-C7: Anterior endplate spurring without significant disc bulge. Mild facet hypertrophy. No stenosis. C7-T1:  Negative interspace.  Mild facet hypertrophy.  No stenosis. Visualized upper thoracic spine demonstrates no significant finding. IMPRESSION: 1. 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 No other worrisome osseous lesions within the cervical spine. 2. 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 3. Multifactorial degenerative changes with resultant mild to moderate left C4 and C5 foraminal stenosis. Electronically Signed   By: Jeannine Boga M.D.   On: 12/31/2021 03:10   DG Hip Unilat W or Wo Pelvis 2-3 Views Left  Result Date: 12/30/2021 CLINICAL DATA:  fall, back/hip pain EXAM: DG HIP (WITH OR WITHOUT PELVIS) 2-3V RIGHT; DG HIP (WITH OR WITHOUT PELVIS) 2-3V LEFT COMPARISON:  None. FINDINGS: Markedly limited evaluation due to overlapping osseous structures and overlying soft tissues. There is no evidence of hip fracture or dislocation bilaterally. No acute displaced fracture or diastasis of the bones of the pelvis. Visualized lower lumbar spine demonstrates degenerative changes. Limited evaluation of the sacrum due to overlying bowel. There is no evidence of severe arthropathy or other focal bone abnormality. IMPRESSION: Negative for acute traumatic injury. Electronically Signed   By: Iven Finn M.D.   On: 12/30/2021 19:09   DG Hip Unilat W or Wo Pelvis 2-3 Views Right  Result Date: 12/30/2021 CLINICAL DATA:  fall, back/hip pain EXAM: DG HIP (WITH OR WITHOUT PELVIS) 2-3V RIGHT; DG HIP (WITH OR WITHOUT PELVIS) 2-3V LEFT COMPARISON:  None. FINDINGS: Markedly limited evaluation due to overlapping osseous structures and overlying soft tissues. There is no evidence of hip fracture or dislocation bilaterally. No acute displaced fracture or diastasis of the bones of the pelvis. Visualized lower lumbar spine demonstrates degenerative changes. Limited evaluation of the sacrum due to overlying bowel. There is no evidence of severe  arthropathy or other focal bone abnormality. IMPRESSION: Negative for acute traumatic injury. Electronically Signed   By: Iven Finn M.D.   On: 12/30/2021 19:09   CT BRAINLAB HEAD W/O CONTRAST (1MM)  Result Date: 01/01/2022 CLINICAL DATA:  BrainLAB CT for operative planning. EXAM: CT HEAD WITHOUT CONTRAST TECHNIQUE: Contiguous axial  images were obtained from the base of the skull through the vertex without intravenous contrast. COMPARISON:  12/30/2021 CT head, correlation is also made with MRI brain 01/01/2022 FINDINGS: Brain: Redemonstrated 2.1 x 1.1 x 2.0 cm left inferior frontal lobe lesion with surrounding vasogenic edema (series 3, image 104), unchanged when remeasured similarly. Mild mass effect on the left frontal horn. No hydrocephalus or extra-axial collection. No acute infarct, hemorrhage, or midline shift. Hyperdense lesion along the right aspect of the tentorium adjacent to the pons, which measures 6 x 9 x 4 mm (series 3, image 65 and series 6, image 69), present on the prior CT but not well seen likely secondary to volume averaging. This correlates with a mildly enhancing lesion on the prior MRI (series 10, image 18). Vascular: No hyperdense vessel. Atherosclerotic calcifications in the intracranial carotid and vertebral arteries. Skull: Hyperostosis frontalis. Prior right suboccipital craniotomy. Partially imaged lucent lesions in the body and posterior elements of C2. No acute fracture. Sinuses/Orbits: No acute finding. Status post bilateral lens replacements. Other: Trace fluid in right mastoid air cells. IMPRESSION: 1. Redemonstrated 2.1 cm left inferior frontal lobe lesion with surrounding vasogenic edema, not significantly changed from prior exam. 2. 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 Electronically Signed   By: Merilyn Baba M.D.   On: 01/01/2022 18:34    Pathology: SURGICAL PATHOLOGY SURGICAL PATHOLOGY  CASE: [id number]  PATIENT: Terry Demuro  Surgical Pathology Report      Clinical History: brain CNS neoplasm (cm)      FINAL MICROSCOPIC DIAGNOSIS:   A. BRAIN, LEFT, BIOPSY:  -High-grade B-cell lymphoma  -See comment   COMMENT:   The sections show  multiple biopsy fragments displaying a dense lymphoid  infiltrate of primarily large lymphoid cells characterized by vesicular  chromatin and prominent nucleoli.  This is associated with brisk  mitosis.  The appearance is diffuse with lack of atypical follicles.  A  battery of immunohistochemical stains shows that the lymphoid cells are  positive for LCA, CD20, CD79a, PAX5, BCL6, Bcl-2 and MUM 1.  No  significant staining is seen with CD138, CD10, CD30, CD34, TdT, or  cyclin D1.  In situ hybridization for kappa and lambda shows weak  cytoplasmic staining for lambda.  No EBV positivity is identified.  There is an admixed T-cell population in the background to a lesser  extent as seen with CD3 and CD5 and there is no apparent co-expression  of CD5 in B-cell areas.  The morphologic and phenotypic features are  consistent with high-grade B-cell lymphoma, large cell type, ABC  subtype. The results were discussed with Dr. Zada Finders on 01/08/2022.     Assessment/Plan CNS lymphoma [facility])  We appreciate the opportunity to participate in the care of Isabel Gibbs.  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  Clinically she has progressed just within the past 2 weeks, now relying on help for activities of daily living, walking aid.    We had an extensive conversation with her family regarding pathology,  prognosis, and available treatment pathways.  They understand at present time, her functional status and age confer a prognosis of ~1 year, based on commonly utilized MSK stratification tool from 2006.    Further needed workup, including PET, MRI spine, slit lamp exam, were reviewed.  We did discuss interventions including high dose methotrexate, rituximab, whole brain radiotherapy, or some combination.  We also discussed hospice referral if focus on quality of life is preferred.    They will take some time to review goals of care, and discuss  amongst broader family.  In the meantime, we will initiate trial of dexamethasone 48m BID given symptomatic progression.    We will touch base with them via phone in 3 days to review goals of care, assess response to steroids.  Screening for potential clinical trials was performed and discussed using eligibility criteria [facility], as well as national database available on [email or web address]    The patient is not a candidate for a research protocol at this time due to poor functional status.   We spent twenty additional minutes teaching regarding the natural history, biology, and historical experience in the treatment of brain tumors. We then discussed in detail the current recommendations for therapy focusing on the mode of administration, mechanism of action, anticipated toxicities, and quality of life issues associated with this plan. We also provided teaching sheets for the patient to take home as an additional resource.  All questions were answered. The patient knows to call the clinic with any problems, questions or concerns. No barriers to learning were detected.  The total time spent in the encounter was 60 minutes and more than 50% was on counseling and review of test results   ZVentura Sellers MD Medical Director of Neuro-Oncology [facility] [place]01/30/23 1:24 PM

## 2022-01-28 ENCOUNTER — Telehealth: Payer: Self-pay | Admitting: Internal Medicine

## 2022-01-28 NOTE — Telephone Encounter (Signed)
Scheduled per 1/30 los, message has been left with pt

## 2022-01-30 ENCOUNTER — Inpatient Hospital Stay: Payer: Medicare Other | Attending: Internal Medicine | Admitting: Internal Medicine

## 2022-01-30 DIAGNOSIS — C8589 Other specified types of non-Hodgkin lymphoma, extranodal and solid organ sites: Secondary | ICD-10-CM | POA: Insufficient documentation

## 2022-01-30 DIAGNOSIS — Z79899 Other long term (current) drug therapy: Secondary | ICD-10-CM | POA: Insufficient documentation

## 2022-01-30 DIAGNOSIS — C8339 Primary central nervous system lymphoma: Secondary | ICD-10-CM

## 2022-01-30 MED ORDER — LORAZEPAM 0.5 MG PO TABS: 1.0000 mg | ORAL_TABLET | Freq: Once | ORAL | 0 refills | Status: DC | PRN

## 2022-01-30 NOTE — Progress Notes (Signed)
I connected with Isabel Gibbs on 01/30/22 at  3:00 PM EST by telephone visit and verified that I am speaking with the correct person using two identifiers.  I discussed the limitations, risks, security and privacy concerns of performing an evaluation and management service by telemedicine and the availability of in-person appointments. I also discussed with the patient that there may be a patient responsible charge related to this service. The patient expressed understanding and agreed to proceed.  Other persons participating in the visit and their role in the encounter:  son Dominica Severin  Patient's location:  Home  Provider's location:  Office  Chief Complaint:  CNS lymphoma [facility])  History of Present Ilness: Tmya Wigington describes clear improvement since starting the steroids.  She is walking better, with improved strength on the right side.  She is also thinking more clearly, with fewer episodes of confusion.  No new or progressive issues, no seizures. Observations: Language and cognition at baseline Assessment and Plan: CNS lymphoma ([place])  Continued goals of care discussion.  Patient and family would like to proceed with staging; decision regarding treatment plan will be deferred until staging is complete.  Slit lamp exam was completed yesterday by ophthalmology, no cells identified in the vitreous.  Recommended workup: MRI total spine MRI brain repeat Whole body PET  Follow Up Instructions: RTC or phone visit following workup, staging.  I discussed the assessment and treatment plan with the patient.  The patient was provided an opportunity to ask questions and all were answered.  The patient agreed with the plan and demonstrated understanding of the instructions.    The patient was advised to call back or seek an in-person evaluation if the symptoms worsen or if the condition fails to improve as anticipated.  I provided 5-10 minutes of non-face-to-face time during this enocunter.  Ventura Sellers,  MD   I provided 22 minutes of non face-to-face telephone visit time during this encounter, and > 50% was spent counseling as documented under my assessment & plan.

## 2022-02-03 ENCOUNTER — Telehealth: Payer: Self-pay | Admitting: Internal Medicine

## 2022-02-03 ENCOUNTER — Other Ambulatory Visit: Payer: Self-pay | Admitting: Radiation Therapy

## 2022-02-03 NOTE — Telephone Encounter (Signed)
Sch per 2/6 inbasket, pt son Isabel Gibbs aware

## 2022-02-12 ENCOUNTER — Other Ambulatory Visit: Payer: Self-pay

## 2022-02-12 ENCOUNTER — Ambulatory Visit (HOSPITAL_COMMUNITY)
Admission: RE | Admit: 2022-02-12 | Discharge: 2022-02-12 | Disposition: A | Discharge status: Home / Self Care | Payer: Medicare Other | Source: Ambulatory Visit | Attending: Internal Medicine | Admitting: Internal Medicine

## 2022-02-12 DIAGNOSIS — C8589 Other specified types of non-Hodgkin lymphoma, extranodal and solid organ sites: Secondary | ICD-10-CM | POA: Diagnosis present

## 2022-02-12 DIAGNOSIS — C8339 Primary central nervous system lymphoma: Secondary | ICD-10-CM

## 2022-02-12 LAB — GLUCOSE, CAPILLARY: Glucose-Capillary: 102 mg/dL — ABNORMAL HIGH (ref 70–99)

## 2022-02-12 IMAGING — PT NM PET TUM IMG INITIAL (PI) SKULL BASE T - THIGH
1 of 7 series · 1 of 25 positions shown · non-contrast
Comparison: Brain MRI [DATE]

CLINICAL DATA: Initial treatment strategy for CNS lymphoma.

EXAM:
NUCLEAR MEDICINE PET SKULL BASE TO THIGH
TECHNIQUE: 9.7 mCi F-18 FDG was injected intravenously. Full-ring PET imaging
was performed from the skull base to thigh after the radiotracer. CT
data was obtained and used for attenuation correction and anatomic
localization.
Fasting blood glucose: 102 mg/dl

[Series 3: pet sk_thigh ac · axial · 5.0mm · 4.07mm/px · 1 of 240 slices shown]
[im 144/240]
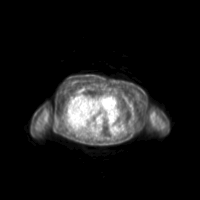

[1 of 25 positions shown; findings below may reference images not displayed]

FINDINGS: Mediastinal blood pool activity: SUV max

Liver activity: SUV max NA

NECK: Intense metabolic activity within the LEFT frontal lobe which
crosses the corpus callosum consistent with lymphoma.

No hypermetabolic adenopathy in the neck.

Incidental CT findings: none

CHEST: No hypermetabolic mediastinal or hilar nodes. No suspicious
pulmonary nodules on the CT scan.

Incidental CT findings: none

ABDOMEN/PELVIS: No abnormal hypermetabolic activity within the
liver, pancreas, adrenal glands, or spleen. No hypermetabolic lymph
nodes in the abdomen or pelvis.

Incidental CT findings: none

SKELETON: No focal hypermetabolic activity to suggest skeletal
metastasis.

Incidental CT findings: none
IMPRESSION: 1. Hypermetabolic lesion in the LEFT frontal lobe crossing midline
consistent with CNS lymphoma.
2. No evidence of metastatic lymphoma within neck, chest, abdomen or
pelvis.

## 2022-02-12 MED ORDER — FLUDEOXYGLUCOSE F - 18 (FDG) INJECTION
8.7000 | Freq: Once | INTRAVENOUS | Status: AC
  Administered 2022-02-12: 9.7 via INTRAVENOUS

## 2022-02-14 ENCOUNTER — Ambulatory Visit (HOSPITAL_COMMUNITY)
Admission: RE | Admit: 2022-02-14 | Discharge: 2022-02-14 | Disposition: A | Discharge status: Home / Self Care | Payer: Medicare Other | Source: Ambulatory Visit | Attending: Internal Medicine | Admitting: Internal Medicine

## 2022-02-14 ENCOUNTER — Other Ambulatory Visit: Payer: Self-pay

## 2022-02-14 DIAGNOSIS — C8589 Other specified types of non-Hodgkin lymphoma, extranodal and solid organ sites: Secondary | ICD-10-CM

## 2022-02-14 DIAGNOSIS — C8339 Primary central nervous system lymphoma: Secondary | ICD-10-CM

## 2022-02-14 IMAGING — MR MR TOTAL SPINE METS SCREENING
24 of 35 series · 34 of 48 positions shown · IV contrast (agent unspecified)
Comparison: Comparison made with previous brain MRI from
[DATE].

CLINICAL DATA: 68-year-old female with history of CNS lymphoma,
evaluate for spinal metastatic disease.

EXAM:
MRI TOTAL SPINE WITHOUT AND WITH CONTRAST
TECHNIQUE: Multisequence MR imaging of the spine from the cervical spine to the
sacrum was performed prior to and following IV contrast
administration for evaluation of spinal metastatic disease.
CONTRAST:  Please see contrast documentation.

[Series 31: T1 · sagittal · 4.0mm · 1.15mm/px · 2 of 8 slices shown (1 of 5)]
[im 1/8]
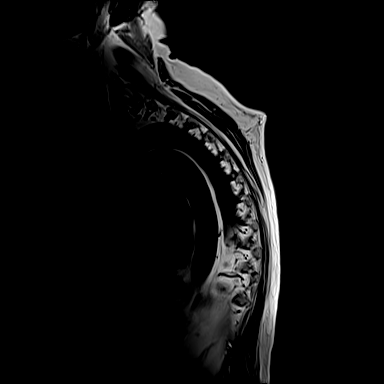
[im 8/8]
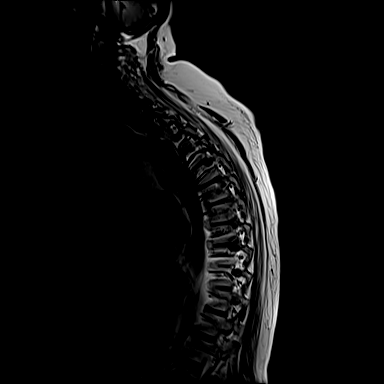

[Series 32: T1 · sagittal · 4.0mm · 1.12mm/px · 2 of 15 slices shown (2 of 5)]
[im 1/15]
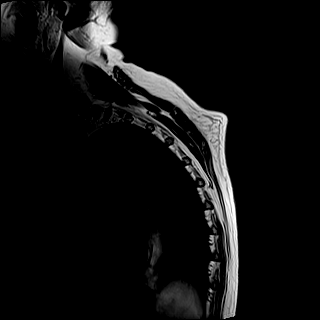
[im 15/15]
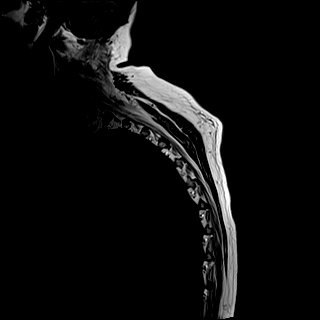

[Series 33: T1 · sagittal · 4.0mm · 1.19mm/px · 1 of 15 slices shown (3 of 5)]
[im 1/15]
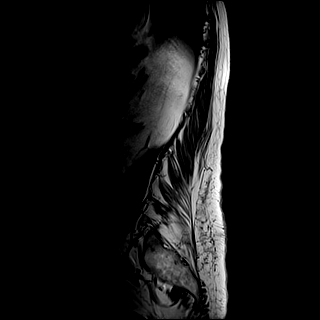

[Series 34: T1 · sagittal · 5.2mm · 1.12mm/px · 1 of 15 slices shown (4 of 5)]
[im 1/15]
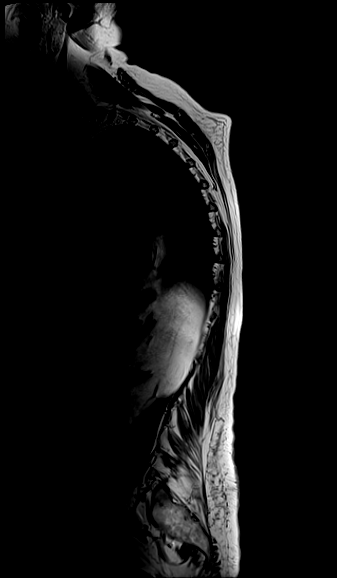

[Series 35: T1 · sagittal · 5.2mm · 1.12mm/px · 1 of 15 slices shown (5 of 5)]
[im 1/15]
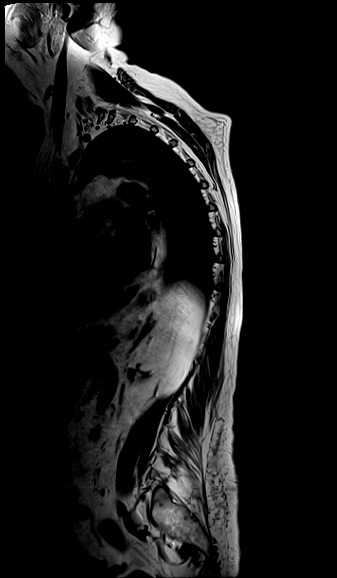

[Series 40: T1 fat-sat post-contrast · sagittal · 4.0mm · 1.12mm/px · 1 of 15 slices shown (1 of 8)]
[im 1/15]
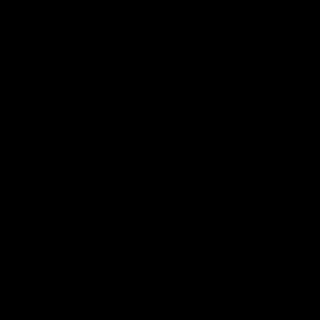

[Series 41: T1 fat-sat post-contrast · sagittal · 4.0mm · 1.19mm/px · 1 of 15 slices shown (2 of 8)]
[im 1/15]
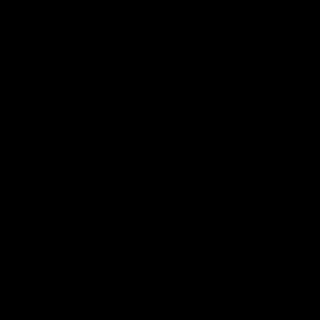

[Series 42: T1 fat-sat · sagittal · 5.2mm · 1.12mm/px · 1 of 15 slices shown (1 of 3)]
[im 1/15]
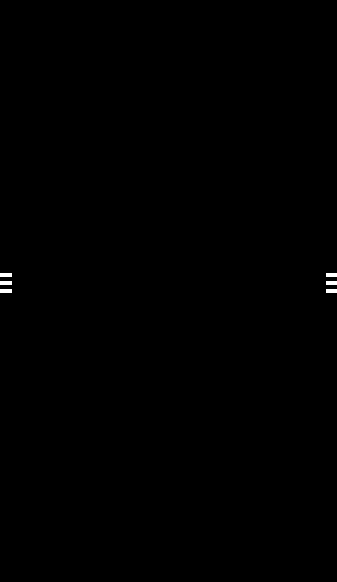

[Series 43: T1 fat-sat · sagittal · 5.2mm · 1.12mm/px · 1 of 15 slices shown (2 of 3)]
[im 1/15]
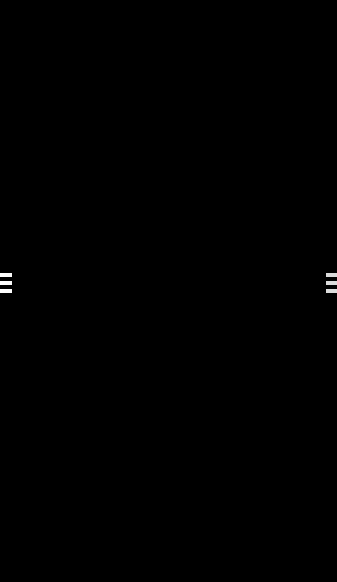

[Series 44: T2 post-contrast · sagittal · 4.0mm · 0.94mm/px · 1 of 15 slices shown (1 of 8)]
[im 1/15]
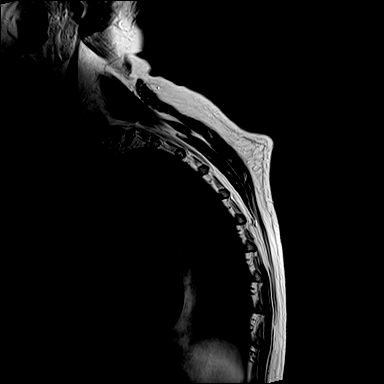

[Series 45: T2 post-contrast · sagittal · 4.0mm · 0.83mm/px · 1 of 15 slices shown (2 of 8)]
[im 1/15]
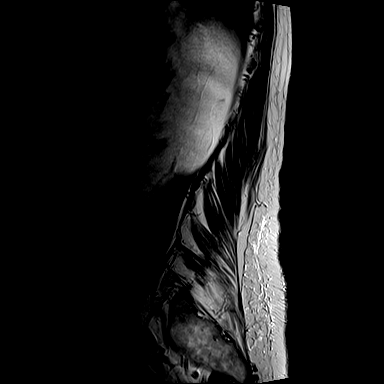

[Series 46: T2 post-contrast · sagittal · 5.2mm · 0.83mm/px · 1 of 15 slices shown (3 of 8)]
[im 1/15]
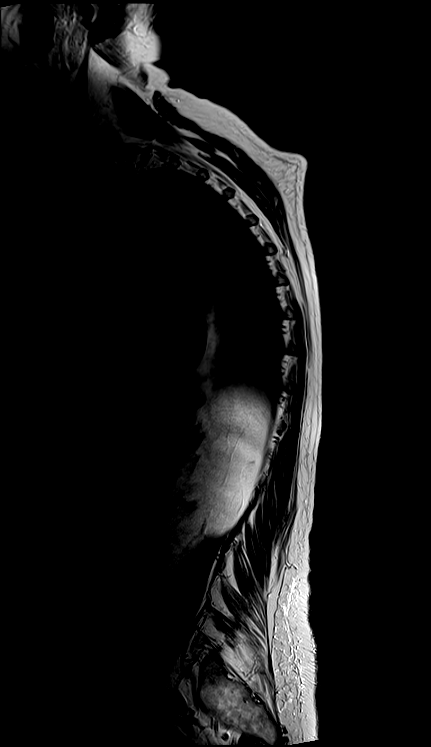

[Series 47: T2 post-contrast · sagittal · 5.2mm · 0.83mm/px · 1 of 15 slices shown (4 of 8)]
[im 1/15]
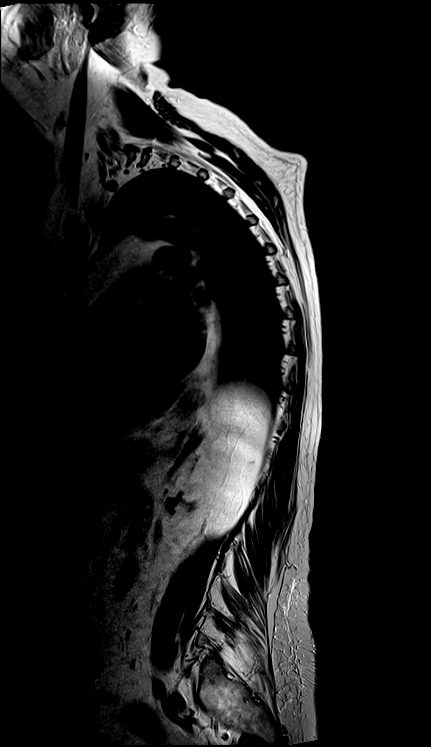

[Series 48: T1 fat-sat post-contrast · sagittal · 4.0mm · 1.12mm/px · 1 of 15 slices shown (3 of 8)]
[im 1/15]
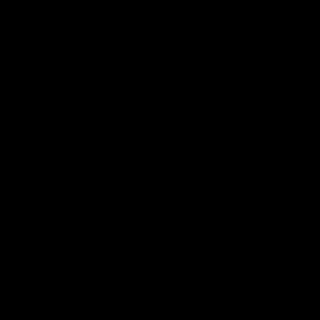

[Series 49: T1 fat-sat post-contrast · sagittal · 4.0mm · 1.19mm/px · 1 of 15 slices shown (4 of 8)]
[im 1/15]
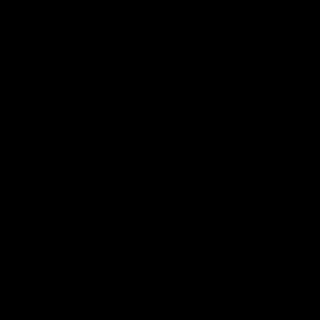

[Series 50: T1 fat-sat post-contrast · sagittal · 5.2mm · 1.12mm/px · 1 of 15 slices shown (5 of 8)]
[im 1/15]
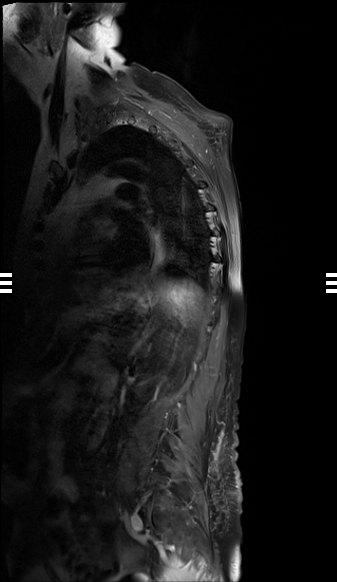

[Series 51: T1 fat-sat post-contrast · sagittal · 5.2mm · 1.12mm/px · 1 of 15 slices shown (6 of 8)]
[im 1/15]
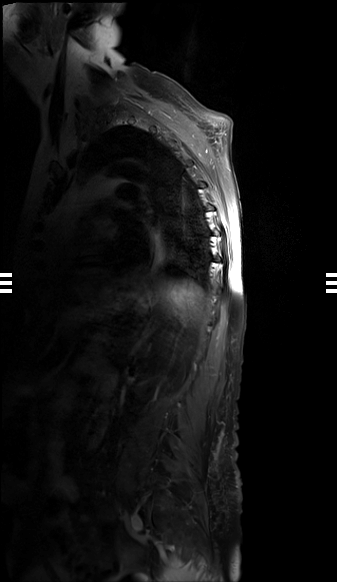

[Series 52: T1 fat-sat post-contrast · sagittal · 4.0mm · 1.12mm/px · 1 of 15 slices shown (7 of 8)]
[im 1/15]
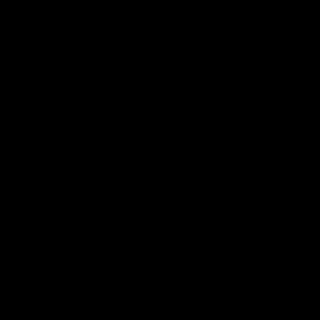

[Series 53: T1 fat-sat post-contrast · sagittal · 4.0mm · 1.19mm/px · 1 of 15 slices shown (8 of 8)]
[im 1/15]
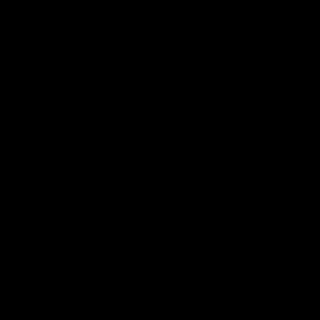

[Series 54: T1 fat-sat · sagittal · 5.2mm · 1.12mm/px · 1 of 15 slices shown (3 of 3)]
[im 1/15]
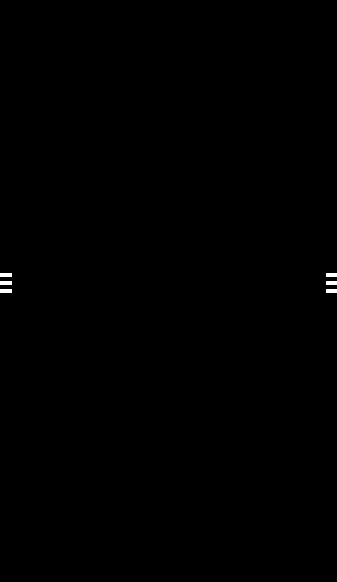

[Series 76: T2 post-contrast · axial · 4.5mm · 0.75mm/px · z∈[-271,-96]mm · 2 of 30 slices shown (5 of 8)]
[im 1/30]
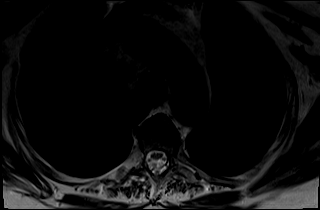
[im 30/30]
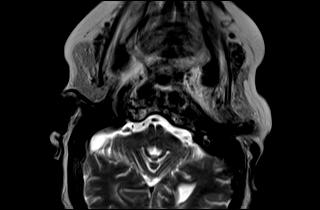

[Series 77: T2 post-contrast · axial · 4.5mm · 0.75mm/px · z∈[-418,-237]mm · 2 of 30 slices shown (6 of 8)]
[im 1/30]
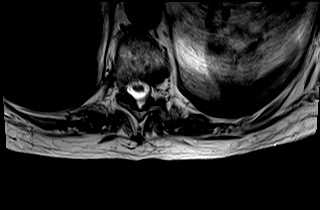
[im 30/30]
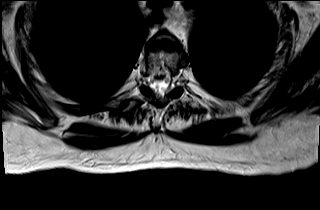

[Series 78: T2 post-contrast · axial · 4.5mm · 0.75mm/px · z∈[-418,-96]mm · 4 of 54 slices shown (7 of 8)]
[im 1/54]
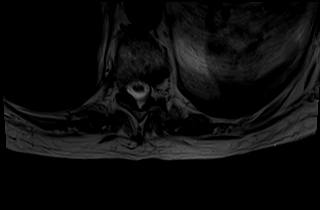
[im 18/54]
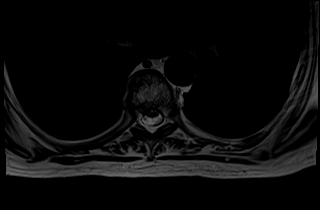
[im 36/54]
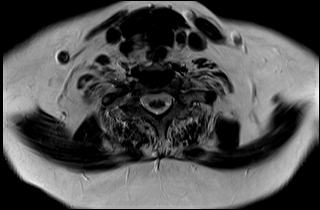
[im 54/54]
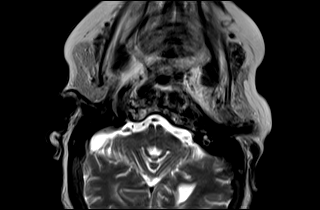

[Series 79: T2 post-contrast · axial · 4.5mm · 0.75mm/px · z∈[-418,-96]mm · 4 of 54 slices shown (8 of 8)]
[im 1/54]
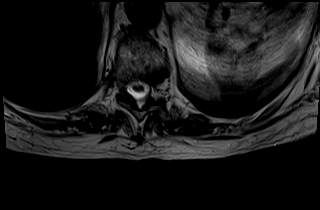
[im 18/54]
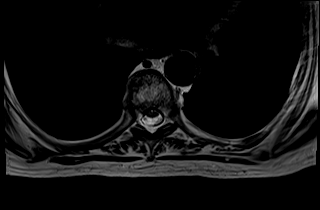
[im 36/54]
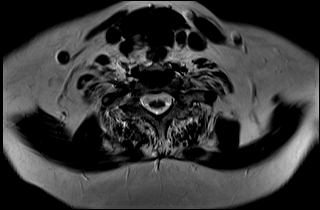
[im 54/54]
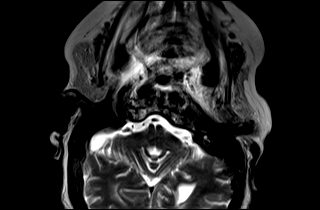

[34 of 48 positions shown; findings below may reference images not displayed]

FINDINGS: MRI CERVICAL SPINE FINDINGS

Alignment: Physiologic with preservation of the normal cervical
lordosis. No listhesis.

Vertebrae: Vertebral body height maintained without acute or chronic
fracture. Bone marrow signal intensity within normal limits. No
discrete or worrisome osseous lesions. No abnormal marrow edema or
enhancement.

Cord: Normal signal and morphology. No evidence for metastatic
disease within the cervical spine. No abnormal enhancement.

Posterior Fossa, vertebral arteries, paraspinal tissues: Paraspinous
soft tissues within normal limits. 1.2 cm right thyroid nodule
noted, of doubtful significance given size and patient age, no
follow-up imaging recommended (ref: [HOSPITAL]. [DATE]): 143-50).

Disc levels:

Mild for age degenerative spondylosis present at C3-4 through C6-7.
No high-grade spinal stenosis or cord impingement. Foramina remain
largely patent.

MRI THORACIC SPINE FINDINGS

Alignment: Exaggeration of the normal thoracic kyphosis. No
listhesis.

Vertebrae: Vertebral body height maintained without acute or chronic
fracture. Bone marrow signal intensity within normal limits. No
worrisome osseous lesions. No abnormal marrow edema or enhancement.

Cord: Normal signal and morphology. No evidence for metastatic
disease. No abnormal enhancement.

Paraspinal and other soft tissues: Unremarkable.

Disc levels:

Central disc protrusion noted at T12-L1 without significant
stenosis. Otherwise, no other significant spondylosis for age. No
canal or neural foraminal stenosis.

MRI LUMBAR SPINE FINDINGS

Segmentation: Standard. Lowest well-formed disc space labeled the
L5-S1 level.

Alignment: Physiologic with preservation of the normal lumbar
lordosis. No listhesis.

Vertebrae: Vertebral body height maintained without acute or chronic
fracture. Bone marrow signal intensity within normal limits. No
discrete or worrisome osseous lesions. No abnormal marrow edema or
enhancement.

Conus medullaris: Extends to the L1 level and appears normal. No
abnormal enhancement seen about the conus medullaris or nerve roots
of the cauda equina to suggest drop metastases.

Paraspinal and other soft tissues: Visualized paraspinous soft
tissues within normal limits.

Disc levels:

Shallow posterior disc bulge noted at L5-S1 without significant
spinal stenosis. Lower lumbar facet hypertrophy noted.
IMPRESSION: 1. No MRI evidence for metastatic disease within the cervical,
thoracic, or lumbar spine.
2. Mild for age degenerative spondylosis without significant
stenosis or overt neural impingement.

## 2022-02-14 IMAGING — MR MR HEAD WO/W CM
18 series · 48 of 48 positions shown · IV contrast (gadavist)
Comparison: PET scan [DATE]. Head CT [DATE]. MRI
[DATE].

CLINICAL DATA: brain/CNS neoplasm. Assess treatment response.
History of CNS lymphoma.

EXAM:
MRI HEAD WITHOUT AND WITH CONTRAST
TECHNIQUE: Multiplanar, multiecho pulse sequences of the brain and surrounding
structures were obtained without and with intravenous contrast.
CONTRAST:  9mL GADAVIST GADOBUTROL 1 MMOL/ML IV SOLN

[Series 9: DWI · axial · 3.0mm · 1.36mm/px · z∈[+1,+151]mm · 6 of 107 slices shown (1 of 2)]
[im 1/107]
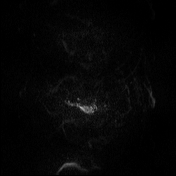
[im 22/107]
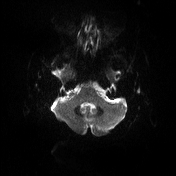
[im 43/107]
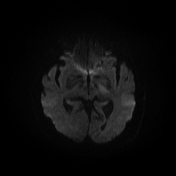
[im 64/107]
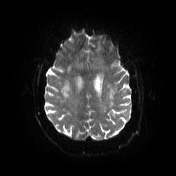
[im 85/107]
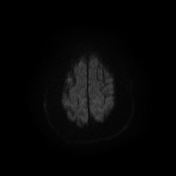
[im 107/107]
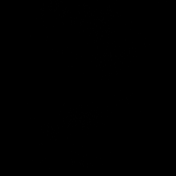

[Series 10: DWI · axial · 3.0mm · 1.36mm/px · z∈[+1,+137]mm · 3 of 49 slices shown (2 of 2)]
[im 1/49]
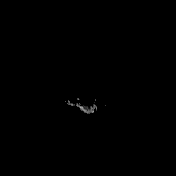
[im 25/49]
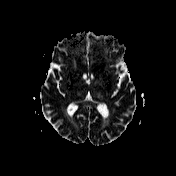
[im 49/49]
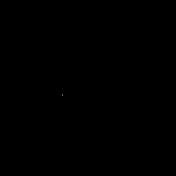

[Series 11: T1 · sagittal · 5.0mm · 0.75mm/px · 1 of 26 slices shown (1 of 6)]
[im 1/26]
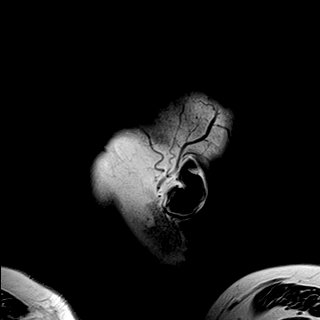

[Series 12: T2 · axial · 5.0mm · 0.62mm/px · 1 of 26 slices shown (1 of 2)]
[im 1/26]
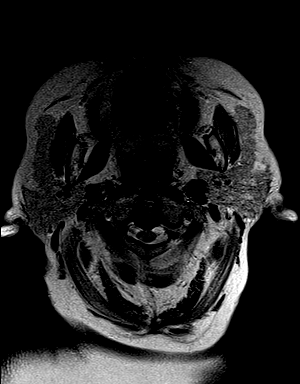

[Series 13: swi_images · axial · 3.0mm · 0.75mm/px · z∈[+1,+155]mm · 2 of 56 slices shown]
[im 1/56]
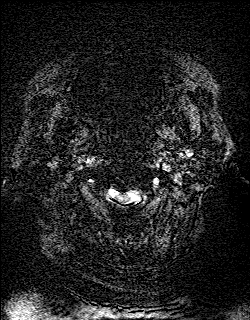
[im 56/56]
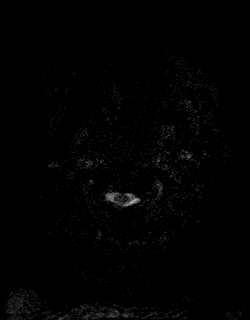

[Series 15: FLAIR · axial · 3.0mm · 0.75mm/px · z∈[+6,+150]mm · 2 of 52 slices shown]
[im 1/52]
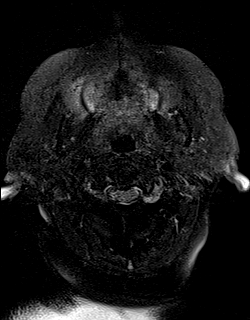
[im 52/52]
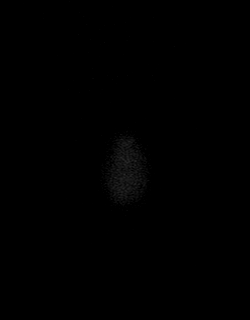

[Series 16: T1 · axial · 1.0mm · 0.94mm/px · z∈[+6,+155]mm · 7 of 160 slices shown (2 of 6)]
[im 1/160]
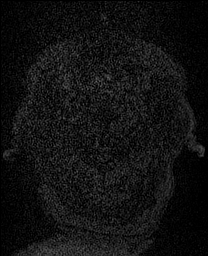
[im 27/160]
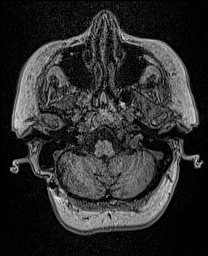
[im 54/160]
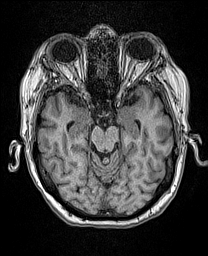
[im 80/160]
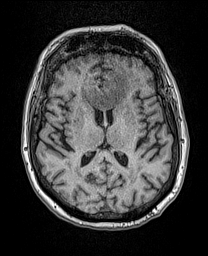
[im 107/160]
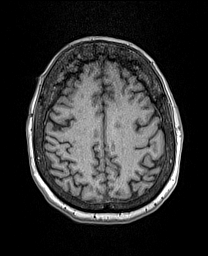
[im 133/160]
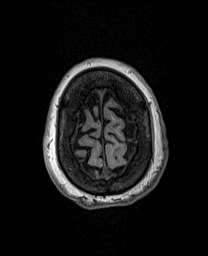
[im 160/160]
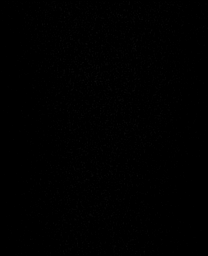

[Series 17: cor dwi_tracew · coronal · 5.0mm · 1.53mm/px · 2 of 58 slices shown]
[im 1/58]
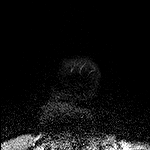
[im 58/58]
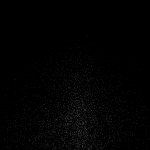

[Series 18: cor dwi_adc · coronal · 5.0mm · 1.53mm/px · 1 of 29 slices shown]
[im 1/29]
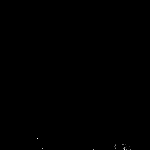

[Series 19: T2 · coronal · 5.0mm · 0.57mm/px · 1 of 35 slices shown (2 of 2)]
[im 1/35]
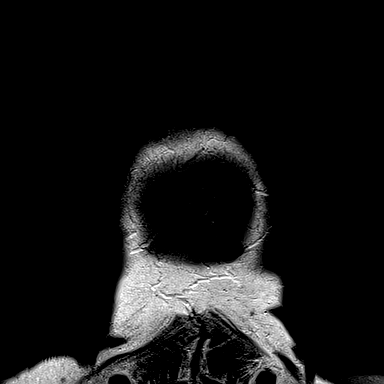

[Series 20: T1 post-contrast · axial · 1.0mm · 0.94mm/px · z∈[+6,+155]mm · 7 of 160 slices shown (1 of 4)]
[im 1/160]
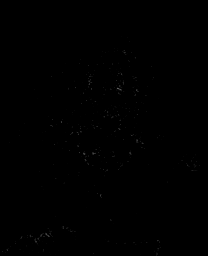
[im 27/160]
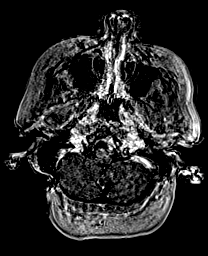
[im 54/160]
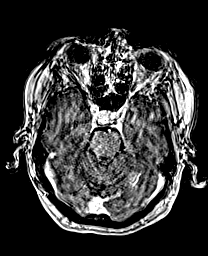
[im 80/160]
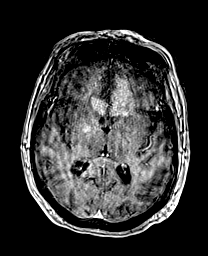
[im 107/160]
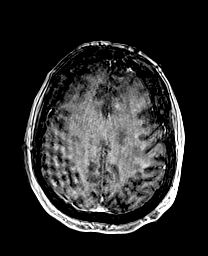
[im 133/160]
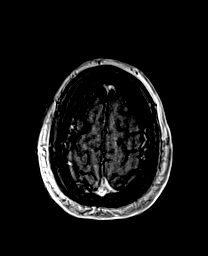
[im 160/160]
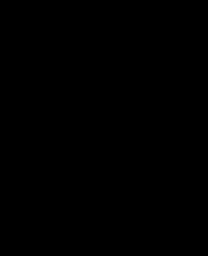

[Series 21: T1 · sagittal · 4.0mm · 0.94mm/px · 1 of 33 slices shown (3 of 6)]
[im 1/33]
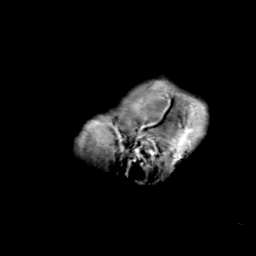

[Series 22: T1 · coronal · 4.0mm · 0.94mm/px · 2 of 39 slices shown (4 of 6)]
[im 1/39]
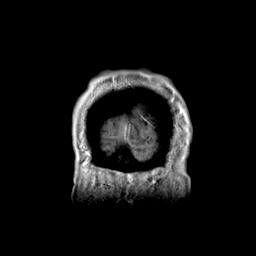
[im 39/39]
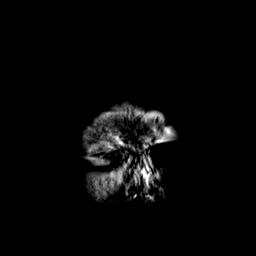

[Series 27: T1 post-contrast · oblique · 1.0mm · 0.94mm/px · 7 of 160 slices shown (2 of 4)]
[im 1/160]
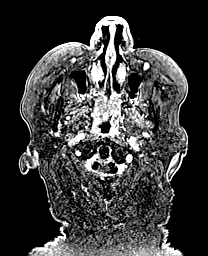
[im 27/160]
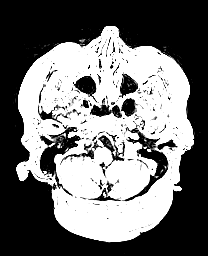
[im 54/160]
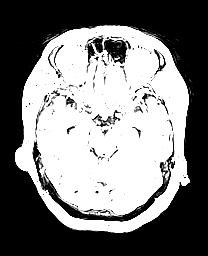
[im 80/160]
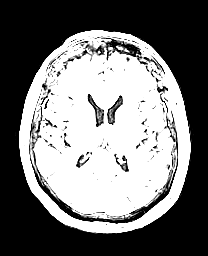
[im 107/160]
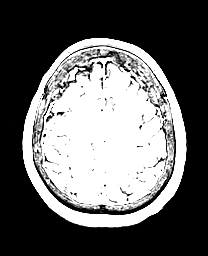
[im 133/160]
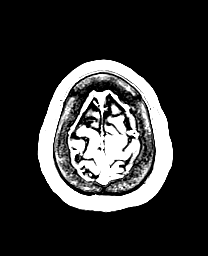
[im 160/160]
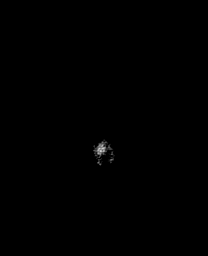

[Series 28: T1 · sagittal · 4.0mm · 0.94mm/px · 1 of 33 slices shown (5 of 6)]
[im 1/33]
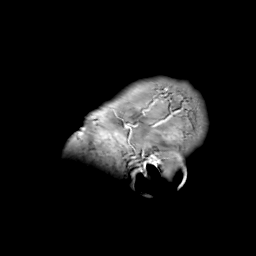

[Series 29: T1 · coronal · 4.0mm · 0.94mm/px · 2 of 43 slices shown (6 of 6)]
[im 1/43]
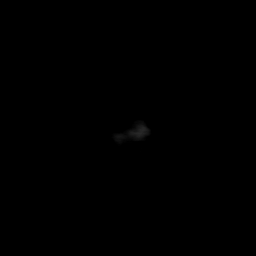
[im 43/43]
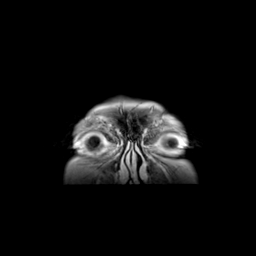

[Series 30: T1 post-contrast · coronal · 5.0mm · 0.43mm/px · 1 of 35 slices shown (3 of 4)]
[im 1/35]
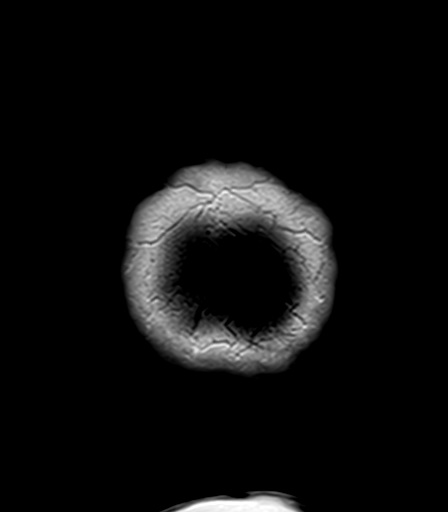

[Series 31: T1 post-contrast · sagittal · 5.0mm · 0.75mm/px · 1 of 26 slices shown (4 of 4)]
[im 1/26]
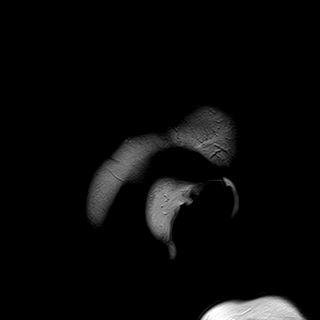

[48 of 48 positions shown; findings below may reference images not displayed]

FINDINGS: Brain: There has been considerable progression of disease since the
study [DATE]. The epicenter of the initial lesion in the left
inferior frontal white matter previously measured approximately 14 x
18 mm as far as the abnormal enhancement, but now measures 25 x 35
mm in that region. Similar increase in tumor mass with respect to
the disease affecting the genu of the corpus callosum and crossing
elsewhere into the frontal white matter. The overall volume of tumor
is estimated to have doubled.

There is new lymphoma visible in the right basal ganglia and
inferior internal capsule with the region of enhancement measuring
approximally 9 x 12 mm. Disease is probably crossing by the anterior
commissure and manifesting as edema at the inferior basal ganglia
and internal capsule on the left, with edema extending into the
cerebral peduncle. There is not abnormal enhancement occurring in
this region presently.

Widespread chronic small-vessel ischemic changes elsewhere
throughout the brain are unchanged. Minor post biopsy changes in the
left frontal lobe show expected evolutionary change without
worsening. Distant right occipital craniotomy without residual or
unexpected findings in that region. No hydrocephalus. No extra-axial
collection.

Vascular: Major vessels at the base of the brain show flow.

Skull and upper cervical spine: Otherwise negative

Sinuses/Orbits: Clear/normal

Other: None
IMPRESSION: Compared to the study of [DATE], there is marked worsening of
disease. Lymphoma tumor volume in the frontal lobes left more than
right with crossing via the genu of the corpus callosum has
approximally doubled in volume. There is a new manifestation of
lymphoma involvement at the inferior basal ganglia/internal capsule
on the right, probably crossing by the anterior commissure to the
base of the brain on the left, where this is manifest more by
increasing edema pattern with relatively subtle low level
enhancement, but with restricted diffusion.

## 2022-02-14 MED ORDER — GADOBUTROL 1 MMOL/ML IV SOLN
9.0000 mL | Freq: Once | INTRAVENOUS | Status: AC | PRN
  Administered 2022-02-14: 9 mL via INTRAVENOUS

## 2022-02-17 ENCOUNTER — Inpatient Hospital Stay: Payer: Medicare Other

## 2022-02-17 ENCOUNTER — Other Ambulatory Visit: Payer: Self-pay

## 2022-02-17 ENCOUNTER — Inpatient Hospital Stay (HOSPITAL_BASED_OUTPATIENT_CLINIC_OR_DEPARTMENT_OTHER): Payer: Medicare Other | Admitting: Internal Medicine

## 2022-02-17 VITALS — BP 103/45 | HR 58 | Temp 97.5°F | Resp 18 | Wt 186.8 lb

## 2022-02-17 DIAGNOSIS — Z7189 Other specified counseling: Secondary | ICD-10-CM

## 2022-02-17 DIAGNOSIS — C8589 Other specified types of non-Hodgkin lymphoma, extranodal and solid organ sites: Secondary | ICD-10-CM | POA: Diagnosis not present

## 2022-02-17 DIAGNOSIS — Z79899 Other long term (current) drug therapy: Secondary | ICD-10-CM | POA: Diagnosis not present

## 2022-02-17 NOTE — Progress Notes (Signed)
[facility] [place], [place] [address] [phone]   New Patient Evaluation  Date of Service: 02/17/22 Patient Name: Isabel Gibbs Patient MRN: [id number] Patient DOB: 1946-04-07 Provider: Ventura Sellers, MD  Identifying Statement:  Isabel Gibbs is a 76 y.o. female with left frontal  CNS lymphoma  who presents for initial consultation and evaluation.    Referring Provider: Karleen Hampshire., MD [id number] [address] [place],  [place] [address]  Oncologic History: Oncology History  CNS lymphoma [facility])  01/02/2022 Cancer Staging   Staging form: Brain and Spinal Cord, AJCC Version 9 - Clinical stage from 01/02/2022: WHO G4 - Signed by Ventura Sellers, MD on 01/27/2022 Histopathologic type: Malignant lymphoma, large B-cell, diffuse, NOS Stage prefix: Initial diagnosis Histologic grading system: 4 grade system    01/27/2022 Initial Diagnosis   CNS lymphoma (HCC)     Biomarkers:  MGMT Unknown.  IDH 1/2 Unknown.  EGFR Unknown  CD20 positive   Interval History: Isabel Gibbs presents today for follow up after completing staging for CNS lymphoma.  She describes overall modest improvement in functional status since starting the decadron, as discussed previously over the phone.  She uses a walker, but at times doesn't need to just  going from bedroom to kitchen.  She mostly dresses and toilets herself.  Family noticed she has some difficulty writing checks, but language and speech appear to be normal.  Denies headaches, seizures.  No issues with any of the workup, imaging.  H+P (01/30/22) Patient presented to medical attention earlier this month after a fall at home, injuring her hip.  She underwent full body scan under trauma protocol, which identified an enhancing lesion in the left frontal lobe consistent with likely primary brain tumor.  Stereotactic biopsy was performed by Dr. Zada Finders on 01/02/22; path demonstrated CD20+ B-cell lymphoma.   Since surgery, she has experienced decline in functional status.  She is now relying on a walker or wheelchair due to worsening right sided weakness, arm and leg.  There is increased difficulty with language, expression, finding words.  Additionally, she is very fatigued and sleeps often throughout the day.  Currently cared for by her sons, here at bedside today.   Medications: Current Outpatient Medications on File Prior to Visit  Medication Sig Dispense Refill   acetaminophen (TYLENOL) 500 MG tablet Take 500-1,000 mg by mouth 2 (two) times daily as needed for mild pain.     alendronate (FOSAMAX) 70 MG tablet Take 70 mg by mouth every Friday.     dexamethasone (DECADRON) 4 MG tablet Take 1 tablet (4 mg total) by mouth 2 (two) times daily with a meal. 60 tablet 1   gabapentin (NEURONTIN) 300 MG capsule Take 900 mg by mouth See admin instructions. Take 900 mg by mouth in the evening and at bedtime     irbesartan (AVAPRO) 150 MG tablet Take 150 mg by mouth at bedtime.     LORazepam (ATIVAN) 0.5 MG tablet Take 2 tablets (1 mg total) by mouth once as needed for up to 1 dose for sedation (MRI sedation). 15 tablet 0   SYSTANE ULTRA PF 0.4-0.3 % SOLN Place 1 drop into both eyes 3 (three) times daily as needed (for dryness).     traMADol (ULTRAM) 50 MG tablet Take 50 mg by mouth See admin instructions. Take 50 mg by mouth in the morning and evening     Vitamin D, Ergocalciferol, (DRISDOL) 1.25 MG (  50000 UNIT) CAPS capsule Take 50,000 Units by mouth every Wednesday.     VOLTAREN 1 % GEL Apply 2 g topically 4 (four) times daily as needed (for bilateral knee pain).     No current facility-administered medications on file prior to visit.    Allergies:  Allergies  Allergen Reactions   Codeine Other (See Comments)    Severe GI upset    Lisinopril Cough        Hydrocodone-Acetaminophen Nausea Only   Past Medical History:  Past Medical History:  Diagnosis Date   Hypertension    Prediabetes     Trigeminal neuralgia of right side of face    Past Surgical History:  Past Surgical History:  Procedure Laterality Date   APPLICATION OF CRANIAL NAVIGATION Left 01/02/2022   Procedure: APPLICATION OF CRANIAL NAVIGATION;  Surgeon: Judith Part, MD;  Location: [facility];  Service: Neurosurgery;  Laterality: Left;   CEREBRAL MICROVASCULAR DECOMPRESSION Right 08/02/2018   [facility]   FRAMELESS  BIOPSY WITH BRAINLAB Left 01/02/2022   Procedure: LEFT BRAIN  BIOPSY WITH Lucky Rathke;  Surgeon: Judith Part, MD;  Location: [facility];  Service: Neurosurgery;  Laterality: Left;   RADIOLOGY WITH ANESTHESIA N/A 01/01/2022   Procedure: MRI of Brain with and without contrast;  Surgeon: Radiologist, Medication, MD;  Location: [facility];  Service: Radiology;  Laterality: N/A;   Social History:  Social History   Socioeconomic History   Marital status: Widowed    Spouse name: Not on file   Number of children: Not on file   Years of education: Not on file   Highest education level: Not on file  Occupational History   Not on file  Tobacco Use   Smoking status: Former    Types: Cigarettes    Quit date: 2000    Years since quitting: 23.1   Smokeless tobacco: Never  Substance and Sexual Activity   Alcohol use: Not on file   Drug use: Not on file   Sexual activity: Not on file  Other Topics Concern   Not on file  Social History Narrative   Not on file   Social Determinants of Health   Financial Resource Strain: Not on file  Food Insecurity: Not on file  Transportation Needs: Not on file  Physical Activity: Not on file  Stress: Not on file  Social Connections: Not on file  Intimate Partner Violence: Not on file   Family History:  Family History  Problem Relation Age of Onset   Heart failure Brother    Diabetes Brother    Scientist, research (medical) Parkinson White syndrome Son     Review of Systems: Constitutional: Doesn't report fevers, chills or abnormal weight loss Eyes: Doesn't report blurriness of  vision Ears, nose, mouth, throat, and face: Doesn't report sore throat Respiratory: Doesn't report cough, dyspnea or wheezes Cardiovascular: Doesn't report palpitation, chest discomfort  Gastrointestinal:  Doesn't report nausea, constipation, diarrhea GU: Doesn't report incontinence Skin: Doesn't report skin rashes Neurological: Per HPI Musculoskeletal: Doesn't report joint pain Behavioral/Psych: Doesn't report anxiety  Physical Exam: Vitals:   02/17/22 0938  BP: (!) 103/45  Pulse: (!) 58  Resp: 18  Temp: (!) 97.5 F (36.4 C)  SpO2: 98%   KPS: 60. General: Alert, cooperative, pleasant, in no acute distress Head: Normal EENT: No conjunctival injection or scleral icterus.  Lungs: Resp effort normal Cardiac: Regular rate Abdomen: Non-distended abdomen Skin: No rashes cyanosis or petechiae. Extremities: No clubbing or edema  Neurologic Exam: Mental Status: Awake, but drowsy,  attentive to examiner only with stimulation. Oriented to self and environment. Language is impaired with regards to fluency, comprehension intact to multi step.  Cranial Nerves: Visual acuity is grossly normal. Visual fields are full. Extra-ocular movements intact. No ptosis. Face is symmetric Motor: Tone and bulk are normal. Power is noted 4/5 in right arm and leg. Reflexes are symmetric, no pathologic reflexes present.  Sensory: Intact to light touch Gait: Hemiparetic   Labs: I have reviewed the data as listed    Component Value Date/Time   NA 137 12/31/2021 0725   K 3.9 12/31/2021 0725   CL 108 12/31/2021 0725   CO2 23 12/31/2021 0725   GLUCOSE 104 (H) 12/31/2021 0725   BUN 11 12/31/2021 0725   CREATININE 0.68 12/31/2021 0725   CALCIUM 7.1 (L) 12/31/2021 0725   PROT 6.3 (L) 12/30/2021 1732   ALBUMIN 3.6 12/30/2021 1732   AST 31 12/30/2021 1732   ALT 38 12/30/2021 1732   ALKPHOS 59 12/30/2021 1732   BILITOT 1.1 12/30/2021 1732   GFRNONAA >60 12/31/2021 0725   Lab Results  Component Value  Date   WBC 7.1 12/31/2021   NEUTROABS 10.2 (H) 12/30/2021   HGB 11.2 (L) 12/31/2021   HCT 35.4 (L) 12/31/2021   MCV 89.2 12/31/2021   PLT 217 12/31/2021   Imaging:  [facility] Clinician Interpretation: I have personally reviewed the CNS images as listed.  My interpretation, in the context of the patient's clinical presentation, is progressive disease  MR BRAIN W WO CONTRAST  Result Date: 02/15/2022 CLINICAL DATA:  brain/CNS neoplasm. Assess treatment response. History of CNS lymphoma. EXAM: MRI HEAD WITHOUT AND WITH CONTRAST TECHNIQUE: Multiplanar, multiecho pulse sequences of the brain and surrounding structures were obtained without and with intravenous contrast. CONTRAST:  29m GADAVIST GADOBUTROL 1 MMOL/ML IV SOLN COMPARISON:  PET scan 02/12/2022. Head CT 01/02/2022. MRI 01/01/2022. FINDINGS: Brain: There has been considerable progression of disease since the study 01/01/2022. The epicenter of the initial lesion in the left inferior frontal white matter previously measured approximately 14 x 18 mm as far as the abnormal enhancement, but now measures 25 x 35 mm in that region. Similar increase in tumor mass with respect to the disease affecting the genu of the corpus callosum and crossing elsewhere into the frontal white matter. The overall volume of tumor is estimated to have doubled. There is new lymphoma visible in the right basal ganglia and inferior internal capsule with the region of enhancement measuring approximally 9 x 12 mm. Disease is probably crossing by the anterior commissure and manifesting as edema at the inferior basal ganglia and internal capsule on the left, with edema extending into the cerebral peduncle. There is not abnormal enhancement occurring in this region presently. Widespread chronic small-vessel ischemic changes elsewhere throughout the brain are unchanged. Minor post biopsy changes in the left frontal lobe show expected evolutionary change without worsening. Distant right  occipital craniotomy without residual or unexpected findings in that region. No hydrocephalus. No extra-axial collection. Vascular: Major vessels at the base of the brain show flow. Skull and upper cervical spine: Otherwise negative Sinuses/Orbits: Clear/normal Other: None IMPRESSION: Compared to the study of 01/01/2022, there is marked worsening of disease. Lymphoma tumor volume in the frontal lobes left more than right with crossing via the genu of the corpus callosum has approximally doubled in volume. There is a new manifestation of lymphoma involvement at the inferior basal ganglia/internal capsule on the right, probably crossing by the anterior commissure to the base of  the brain on the left, where this is manifest more by increasing edema pattern with relatively subtle low level enhancement, but with restricted diffusion. Electronically Signed   By: Nelson Chimes M.D.   On: 02/15/2022 16:07   NM PET Image Initial (PI) Skull Base To Thigh (F-18 FDG)  Result Date: 02/13/2022 CLINICAL DATA:  Initial treatment strategy for CNS lymphoma. EXAM: NUCLEAR MEDICINE PET SKULL BASE TO THIGH TECHNIQUE: 9.7 mCi F-18 FDG was injected intravenously. Full-ring PET imaging was performed from the skull base to thigh after the radiotracer. CT data was obtained and used for attenuation correction and anatomic localization. Fasting blood glucose: 102 mg/dl COMPARISON:  Brain MRI 01/01/2022 FINDINGS: Mediastinal blood pool activity: SUV max 2.93 Liver activity: SUV max NA NECK: Intense metabolic activity within the LEFT frontal lobe which crosses the corpus callosum consistent with lymphoma. No hypermetabolic adenopathy in the neck. Incidental CT findings: none CHEST: No hypermetabolic mediastinal or hilar nodes. No suspicious pulmonary nodules on the CT scan. Incidental CT findings: none ABDOMEN/PELVIS: No abnormal hypermetabolic activity within the liver, pancreas, adrenal glands, or spleen. No hypermetabolic lymph nodes in  the abdomen or pelvis. Incidental CT findings: none SKELETON: No focal hypermetabolic activity to suggest skeletal metastasis. Incidental CT findings: none IMPRESSION: 1. Hypermetabolic lesion in the LEFT frontal lobe crossing midline consistent with CNS lymphoma. 2. No evidence of metastatic lymphoma within neck, chest, abdomen or pelvis. Electronically Signed   By: Suzy Bouchard M.D.   On: 02/13/2022 16:18   MR TOTAL SPINE METS SCREENING  Result Date: 02/15/2022 CLINICAL DATA:  76 year old female with history of CNS lymphoma, evaluate for spinal metastatic disease. EXAM: MRI TOTAL SPINE WITHOUT AND WITH CONTRAST TECHNIQUE: Multisequence MR imaging of the spine from the cervical spine to the sacrum was performed prior to and following IV contrast administration for evaluation of spinal metastatic disease. CONTRAST:  Please see contrast documentation. COMPARISON:  Comparison made with previous brain MRI from 01/01/2022. FINDINGS: MRI CERVICAL SPINE FINDINGS Alignment: Physiologic with preservation of the normal cervical lordosis. No listhesis. Vertebrae: Vertebral body height maintained without acute or chronic fracture. Bone marrow signal intensity within normal limits. No discrete or worrisome osseous lesions. No abnormal marrow edema or enhancement. Cord: Normal signal and morphology. No evidence for metastatic disease within the cervical spine. No abnormal enhancement. Posterior Fossa, vertebral arteries, paraspinal tissues: Paraspinous soft tissues within normal limits. 1.2 cm right thyroid nodule noted, of doubtful significance given size and patient age, no follow-up imaging recommended (ref: J Am Coll Radiol. 2015 Feb;12(2): 143-50). Disc levels: Mild for age degenerative spondylosis present at C3-4 through C6-7. No high-grade spinal stenosis or cord impingement. Foramina remain largely patent. MRI THORACIC SPINE FINDINGS Alignment: Exaggeration of the normal thoracic kyphosis. No listhesis.  Vertebrae: Vertebral body height maintained without acute or chronic fracture. Bone marrow signal intensity within normal limits. No worrisome osseous lesions. No abnormal marrow edema or enhancement. Cord: Normal signal and morphology. No evidence for metastatic disease. No abnormal enhancement. Paraspinal and other soft tissues: Unremarkable. Disc levels: Central disc protrusion noted at T12-L1 without significant stenosis. Otherwise, no other significant spondylosis for age. No canal or neural foraminal stenosis. MRI LUMBAR SPINE FINDINGS Segmentation: Standard. Lowest well-formed disc space labeled the L5-S1 level. Alignment: Physiologic with preservation of the normal lumbar lordosis. No listhesis. Vertebrae: Vertebral body height maintained without acute or chronic fracture. Bone marrow signal intensity within normal limits. No discrete or worrisome osseous lesions. No abnormal marrow edema or enhancement. Conus medullaris: Extends to  the L1 level and appears normal. No abnormal enhancement seen about the conus medullaris or nerve roots of the cauda equina to suggest drop metastases. Paraspinal and other soft tissues: Visualized paraspinous soft tissues within normal limits. Disc levels: Shallow posterior disc bulge noted at L5-S1 without significant spinal stenosis. Lower lumbar facet hypertrophy noted. IMPRESSION: 1. No MRI evidence for metastatic disease within the cervical, thoracic, or lumbar spine. 2. Mild for age degenerative spondylosis without significant stenosis or overt neural impingement. Electronically Signed   By: Jeannine Boga M.D.   On: 02/15/2022 03:46    Assessment/Plan CNS lymphoma ([facility])  Goals of care, counseling/discussion  Isabel Gibbs is clinically stable today, now having completed staging for primary CNS lymphoma.  Body PET, spine MRI, slit lamp were unremarkable.  Bone marrow and CSF analysis were deferred.  Continues on decadron 18m twice per day.   Brain MRI does  demonstrate considerable progression of disease over past month, mainly involving frontal callosal and mesial left frontal regions.  She is not particularly focal from this progression, but also notable that corticosteroids have not suppressed the burden of enhancement.  We had a additional conversations with her family regarding pathology, prognosis, and available treatment pathways.    Options for treatment would be: -High dose Methotrexate with Rituximab, inpatient regimen -Whole brain radiation -Rituximab+Temozolomide -Hospice referral  With MTX based therapy, we would need to obtain central venous access.  They will again take some time to review treatment plan options, and discuss amongst broader family.  We will wait to hear from them regarding preferred path forward.    In meantime, decadron should be decreased to 465mdaily if tolerated.  All questions were answered. The patient knows to call the clinic with any problems, questions or concerns. No barriers to learning were detected.  The total time spent in the encounter was 40 minutes and more than 50% was on counseling and review of test results   ZaVentura SellersMD Medical Director of Neuro-Oncology [facility] [place]2/20/23 9:31 AM

## 2022-02-21 ENCOUNTER — Telehealth: Payer: Self-pay

## 2022-02-21 NOTE — Telephone Encounter (Signed)
Pt's son called to say that the patient has decided that she would like to begin chemotherapy for her cancer.  Message routed to Dr. Mickeal Skinner.

## 2022-02-24 ENCOUNTER — Other Ambulatory Visit: Payer: Self-pay | Admitting: *Deleted

## 2022-02-24 ENCOUNTER — Other Ambulatory Visit: Payer: Self-pay | Admitting: Internal Medicine

## 2022-02-24 DIAGNOSIS — C8589 Other specified types of non-Hodgkin lymphoma, extranodal and solid organ sites: Secondary | ICD-10-CM

## 2022-02-24 NOTE — Progress Notes (Signed)
START ON PATHWAY REGIMEN - Neuro     Cycle 1: A cycle is 28 days:     Methotrexate      Leucovorin      Rituximab-xxxx    Cycle 2: A cycle is 28 days:     Methotrexate      Leucovorin      Rituximab-xxxx    Cycles 3 and 4: A cycle is every 28 days:     Methotrexate      Leucovorin   **Always confirm dose/schedule in your pharmacy ordering system**  Patient Characteristics: Primary CNS Lymphoma, Newly Diagnosed, Induction, Candidate for High-Dose Methotrexate Disease Classification: Primary CNS Lymphoma Disease Status: Newly Diagnosed Intent of Therapy: Non-Curative / Palliative Intent, Discussed with Patient

## 2022-03-03 ENCOUNTER — Other Ambulatory Visit (HOSPITAL_COMMUNITY): Payer: Self-pay | Admitting: Physician Assistant

## 2022-03-03 NOTE — H&P (Signed)
Chief Complaint: Patient was seen in consultation today for tunneled catheter with port placement at the request of Isabel Gibbs  Referring Physician(s): Isabel Gibbs  Supervising Physician: Isabel Gibbs  Patient Status: [facility] - Out-pt  History of Present Illness: Isabel Gibbs is a 76 y.o. female with past medical history of HTN and prediabetes.  Patient presented to ED complaining of a fall 12/30/2021.  Scans at that time identified enhancing lesion in the left frontal lobe consistent with primary brain tumor.  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  Patient was referred by Dr. Cecil Cobbs for Port-A-Cath placement to begin chemotherapy.  Past Medical History:  Diagnosis Date   Hypertension    Prediabetes    Trigeminal neuralgia of right side of face     Past Surgical History:  Procedure Laterality Date   APPLICATION OF CRANIAL NAVIGATION Left 01/02/2022   Procedure: APPLICATION OF CRANIAL NAVIGATION;  Surgeon: Judith Part, MD;  Location: [place];  Service: Neurosurgery;  Laterality: Left;   CEREBRAL MICROVASCULAR DECOMPRESSION Right 08/02/2018   [facility]   FRAMELESS  BIOPSY WITH BRAINLAB Left 01/02/2022   Procedure: LEFT BRAIN  BIOPSY WITH Lucky Rathke;  Surgeon: Judith Part, MD;  Location: [place];  Service: Neurosurgery;  Laterality: Left;   RADIOLOGY WITH ANESTHESIA N/A 01/01/2022   Procedure: MRI of Brain with and without contrast;  Surgeon: Radiologist, Medication, MD;  Location: [place];  Service: Radiology;  Laterality: N/A;    Allergies: Codeine, Lisinopril, and Hydrocodone-acetaminophen  Medications: Prior to Admission medications   Medication Sig Start Date End Date Taking? Authorizing Provider  acetaminophen (TYLENOL) 500 MG tablet Take 500-1,000 mg by mouth 2 (two) times daily as needed for mild pain.    [provider]  alendronate (FOSAMAX) 70 MG tablet Take 70 mg by mouth every Friday. 11/14/21   [provider]  dexamethasone (DECADRON) 4 MG tablet Take 1 tablet (4 mg total) by mouth 2 (two) times daily with a meal. 01/27/22   Vaslow, Acey Lav, MD  gabapentin (NEURONTIN) 300 MG capsule Take 900 mg by mouth See admin instructions. Take 900 mg by mouth in the evening and at bedtime    [provider]  irbesartan (AVAPRO) 150 MG tablet Take 150 mg by mouth at bedtime.    [provider]  LORazepam (ATIVAN) 0.5 MG tablet Take 2 tablets (1 mg total) by mouth once as needed for up to 1 dose for sedation (MRI sedation). 01/30/22   Vaslow, Acey Lav, MD  SYSTANE ULTRA PF 0.4-0.3 % SOLN Place 1 drop into both eyes 3 (three) times daily as needed (for dryness).    [provider]  traMADol (ULTRAM) 50 MG tablet Take 50 mg by mouth See admin instructions. Take 50 mg by mouth in the morning and evening    [provider]  Vitamin D, Ergocalciferol, (DRISDOL) 1.25 MG (50000 UNIT) CAPS capsule Take 50,000 Units by mouth every Wednesday.    [provider]  VOLTAREN 1 % GEL Apply 2 g topically 4 (four) times daily as needed (for bilateral knee pain).    [provider]     Family History  Problem Relation Age of Onset   Heart failure Brother    Diabetes Brother    Yves Dill Parkinson White syndrome Son     Social History   Socioeconomic History   Marital status: Widowed    Spouse name: Not on file   Number of children: Not  on file   Years of education: Not on file   Highest education level: Not on file  Occupational History   Not on file  Tobacco Use   Smoking status: Former    Types: Cigarettes    Quit date: 2000    Years since quitting: 23.1   Smokeless tobacco: Never  Substance and Sexual Activity   Alcohol use: Not on file   Drug use: Not on file   Sexual activity: Not on file  Other Topics Concern   Not on file  Social History Narrative   Not on file   Social Determinants of Health   Financial Resource Strain: Not on file   Food Insecurity: Not on file  Transportation Needs: Not on file  Physical Activity: Not on file  Stress: Not on file  Social Connections: Not on file      Review of Systems: A 12 point ROS discussed and pertinent positives are indicated in the HPI above.  All other systems are negative.  Review of Systems  Constitutional:  Negative for appetite change, chills and fever.  HENT:  Negative for nosebleeds.   Eyes:  Negative for visual disturbance.  Respiratory:  Negative for cough and shortness of breath.   Cardiovascular:  Negative for chest pain and leg swelling.  Gastrointestinal:  Negative for abdominal pain, blood in stool, nausea and vomiting.  Genitourinary:  Negative for hematuria.  Neurological:  Positive for headaches. Negative for dizziness.   Vital Signs: BP (!) 129/58    Pulse (!) 46    Temp 98 F (36.7 C) (Oral)    Resp 18    Ht 5' 7.5" (1.715 m)    Wt 194 lb (88 kg)    BMI 29.94 kg/m   Physical Exam Constitutional:      Appearance: Normal appearance. She is not ill-appearing.  HENT:     Head: Normocephalic and atraumatic.     Mouth/Throat:     Mouth: Mucous membranes are moist.     Pharynx: Oropharynx is clear.  Eyes:     Extraocular Movements: Extraocular movements intact.     Pupils: Pupils are equal, round, and reactive to light.  Cardiovascular:     Rate and Rhythm: Bradycardia present.     Pulses: Normal pulses.     Heart sounds: Normal heart sounds.  Pulmonary:     Effort: Pulmonary effort is normal. No respiratory distress.     Breath sounds: Normal breath sounds. No stridor. No wheezing, rhonchi or rales.  Abdominal:     General: Bowel sounds are normal. There is no distension.     Palpations: Abdomen is soft.     Tenderness: There is no abdominal tenderness. There is no guarding.  Musculoskeletal:     Right lower leg: No edema.     Left lower leg: No edema.  Skin:    General: Skin is warm and dry.  Neurological:     Mental Status: She is  alert and oriented to person, place, and time.  Psychiatric:        Mood and Affect: Mood normal.        Behavior: Behavior normal.        Thought Content: Thought content normal.        Judgment: Judgment normal.    Imaging: MR BRAIN W WO CONTRAST  Result Date: 02/15/2022 CLINICAL DATA:  brain/CNS neoplasm. Assess treatment response. History of CNS lymphoma. EXAM: MRI HEAD WITHOUT AND WITH CONTRAST TECHNIQUE: Multiplanar, multiecho pulse sequences of  the brain and surrounding structures were obtained without and with intravenous contrast. CONTRAST:  53m GADAVIST GADOBUTROL 1 MMOL/ML IV SOLN COMPARISON:  PET scan 02/12/2022. Head CT 01/02/2022. MRI 01/01/2022. FINDINGS: Brain: There has been considerable progression of disease since the study 01/01/2022. The epicenter of the initial lesion in the left inferior frontal white matter previously measured approximately 14 x 18 mm as far as the abnormal enhancement, but now measures 25 x 35 mm in that region. Similar increase in tumor mass with respect to the disease affecting the genu of the corpus callosum and crossing elsewhere into the frontal white matter. The overall volume of tumor is estimated to have doubled. There is new lymphoma visible in the right basal ganglia and inferior internal capsule with the region of enhancement measuring approximally 9 x 12 mm. Disease is probably crossing by the anterior commissure and manifesting as edema at the inferior basal ganglia and internal capsule on the left, with edema extending into the cerebral peduncle. There is not abnormal enhancement occurring in this region presently. Widespread chronic small-vessel ischemic changes elsewhere throughout the brain are unchanged. Minor post biopsy changes in the left frontal lobe show expected evolutionary change without worsening. Distant right occipital craniotomy without residual or unexpected findings in that region. No hydrocephalus. No extra-axial collection.  Vascular: Major vessels at the base of the brain show flow. Skull and upper cervical spine: Otherwise negative Sinuses/Orbits: Clear/normal Other: None IMPRESSION: Compared to the study of 01/01/2022, there is marked worsening of disease. Lymphoma tumor volume in the frontal lobes left more than right with crossing via the genu of the corpus callosum has approximally doubled in volume. There is a new manifestation of lymphoma involvement at the inferior basal ganglia/internal capsule on the right, probably crossing by the anterior commissure to the base of the brain on the left, where this is manifest more by increasing edema pattern with relatively subtle low level enhancement, but with restricted diffusion. Electronically Signed   By: MNelson ChimesM.D.   On: 02/15/2022 16:07   NM PET Image Initial (PI) Skull Base To Thigh (F-18 FDG)  Result Date: 02/13/2022 CLINICAL DATA:  Initial treatment strategy for CNS lymphoma. EXAM: NUCLEAR MEDICINE PET SKULL BASE TO THIGH TECHNIQUE: 9.7 mCi F-18 FDG was injected intravenously. Full-ring PET imaging was performed from the skull base to thigh after the radiotracer. CT data was obtained and used for attenuation correction and anatomic localization. Fasting blood glucose: 102 mg/dl COMPARISON:  Brain MRI 01/01/2022 FINDINGS: Mediastinal blood pool activity: SUV max 2.93 Liver activity: SUV max NA NECK: Intense metabolic activity within the LEFT frontal lobe which crosses the corpus callosum consistent with lymphoma. No hypermetabolic adenopathy in the neck. Incidental CT findings: none CHEST: No hypermetabolic mediastinal or hilar nodes. No suspicious pulmonary nodules on the CT scan. Incidental CT findings: none ABDOMEN/PELVIS: No abnormal hypermetabolic activity within the liver, pancreas, adrenal glands, or spleen. No hypermetabolic lymph nodes in the abdomen or pelvis. Incidental CT findings: none SKELETON: No focal hypermetabolic activity to suggest skeletal  metastasis. Incidental CT findings: none IMPRESSION: 1. Hypermetabolic lesion in the LEFT frontal lobe crossing midline consistent with CNS lymphoma. 2. No evidence of metastatic lymphoma within neck, chest, abdomen or pelvis. Electronically Signed   By: SSuzy BouchardM.D.   On: 02/13/2022 16:18   MR TOTAL SPINE METS SCREENING  Result Date: 02/15/2022 CLINICAL DATA:  76year old female with history of CNS lymphoma, evaluate for spinal metastatic disease. EXAM: MRI TOTAL SPINE WITHOUT AND WITH  CONTRAST TECHNIQUE: Multisequence MR imaging of the spine from the cervical spine to the sacrum was performed prior to and following IV contrast administration for evaluation of spinal metastatic disease. CONTRAST:  Please see contrast documentation. COMPARISON:  Comparison made with previous brain MRI from 01/01/2022. FINDINGS: MRI CERVICAL SPINE FINDINGS Alignment: Physiologic with preservation of the normal cervical lordosis. No listhesis. Vertebrae: Vertebral body height maintained without acute or chronic fracture. Bone marrow signal intensity within normal limits. No discrete or worrisome osseous lesions. No abnormal marrow edema or enhancement. Cord: Normal signal and morphology. No evidence for metastatic disease within the cervical spine. No abnormal enhancement. Posterior Fossa, vertebral arteries, paraspinal tissues: Paraspinous soft tissues within normal limits. 1.2 cm right thyroid nodule noted, of doubtful significance given size and patient age, no follow-up imaging recommended (ref: J Am Coll Radiol. 2015 Feb;12(2): 143-50). Disc levels: Mild for age degenerative spondylosis present at C3-4 through C6-7. No high-grade spinal stenosis or cord impingement. Foramina remain largely patent. MRI THORACIC SPINE FINDINGS Alignment: Exaggeration of the normal thoracic kyphosis. No listhesis. Vertebrae: Vertebral body height maintained without acute or chronic fracture. Bone marrow signal intensity within normal  limits. No worrisome osseous lesions. No abnormal marrow edema or enhancement. Cord: Normal signal and morphology. No evidence for metastatic disease. No abnormal enhancement. Paraspinal and other soft tissues: Unremarkable. Disc levels: Central disc protrusion noted at T12-L1 without significant stenosis. Otherwise, no other significant spondylosis for age. No canal or neural foraminal stenosis. MRI LUMBAR SPINE FINDINGS Segmentation: Standard. Lowest well-formed disc space labeled the L5-S1 level. Alignment: Physiologic with preservation of the normal lumbar lordosis. No listhesis. Vertebrae: Vertebral body height maintained without acute or chronic fracture. Bone marrow signal intensity within normal limits. No discrete or worrisome osseous lesions. No abnormal marrow edema or enhancement. Conus medullaris: Extends to the L1 level and appears normal. No abnormal enhancement seen about the conus medullaris or nerve roots of the cauda equina to suggest drop metastases. Paraspinal and other soft tissues: Visualized paraspinous soft tissues within normal limits. Disc levels: Shallow posterior disc bulge noted at L5-S1 without significant spinal stenosis. Lower lumbar facet hypertrophy noted. IMPRESSION: 1. No MRI evidence for metastatic disease within the cervical, thoracic, or lumbar spine. 2. Mild for age degenerative spondylosis without significant stenosis or overt neural impingement. Electronically Signed   By: Jeannine Boga M.D.   On: 02/15/2022 03:46    Labs:  CBC: Recent Labs    12/30/21 1732 12/31/21 0725  WBC 11.7* 7.1  HGB 13.6 11.2*  HCT 43.6 35.4*  PLT 265 217    COAGS: No results for input(s): INR, APTT in the last 8760 hours.  BMP: Recent Labs    12/30/21 1732 12/31/21 0725  NA 136 137  Gibbs 3.6 3.9  CL 102 108  CO2 24 23  GLUCOSE 152* 104*  BUN 13 11  CALCIUM 8.6* 7.1*  CREATININE 0.82 0.68  GFRNONAA >60 >60    LIVER FUNCTION TESTS: Recent Labs    12/30/21 1732   BILITOT 1.1  AST 31  ALT 38  ALKPHOS 59  PROT 6.3*  ALBUMIN 3.6    TUMOR MARKERS: No results for input(s): AFPTM, CEA, CA199, CHROMGRNA in the last 8760 hours.  Assessment and Plan: History of HTN and prediabetes.  Patient presented to ED complaining of a fall 12/30/2021.  Scans at that time identified enhancing lesion in the left frontal lobe consistent with primary brain tumor.  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  Patient was referred by Dr. Cecil Cobbs for Port-A-Cath placement to begin chemotherapy.  Pt sitting up on stretcher. She is A&O, calm and pleasant.  She is in no distress. Pt states she is NPO per order.  No thinner noted on home meds.  No labs needed today.  Risks and benefits of image guided port-a-catheter placement was discussed with the patient including, but not limited to bleeding, infection, pneumothorax, or fibrin sheath development and need for additional procedures.  All of the patient's questions were answered, patient is agreeable to proceed. Consent signed and in chart.   Thank you for this interesting consult.  I greatly enjoyed meeting Stefani Baik and look forward to participating in their care.  A copy of this report was sent to the requesting provider on this date.  Electronically Signed: Tyson Alias, NP 03/04/2022, 11:31 AM   I spent a total of 20 minutes in face to face in clinical consultation, greater than 50% of which was counseling/coordinating care for tunneled catheter with port.

## 2022-03-04 ENCOUNTER — Other Ambulatory Visit: Payer: Self-pay

## 2022-03-04 ENCOUNTER — Encounter (HOSPITAL_COMMUNITY): Payer: Self-pay

## 2022-03-04 ENCOUNTER — Other Ambulatory Visit: Payer: Self-pay | Admitting: Internal Medicine

## 2022-03-04 ENCOUNTER — Ambulatory Visit (HOSPITAL_COMMUNITY)
Admission: RE | Admit: 2022-03-04 | Discharge: 2022-03-04 | Disposition: A | Discharge status: Home / Self Care | Payer: Medicare Other | Source: Ambulatory Visit | Attending: Internal Medicine | Admitting: Internal Medicine

## 2022-03-04 DIAGNOSIS — C8589 Other specified types of non-Hodgkin lymphoma, extranodal and solid organ sites: Secondary | ICD-10-CM | POA: Insufficient documentation

## 2022-03-04 DIAGNOSIS — I10 Essential (primary) hypertension: Secondary | ICD-10-CM | POA: Insufficient documentation

## 2022-03-04 DIAGNOSIS — Z79899 Other long term (current) drug therapy: Secondary | ICD-10-CM | POA: Insufficient documentation

## 2022-03-04 DIAGNOSIS — Z87891 Personal history of nicotine dependence: Secondary | ICD-10-CM | POA: Insufficient documentation

## 2022-03-04 DIAGNOSIS — R7303 Prediabetes: Secondary | ICD-10-CM | POA: Insufficient documentation

## 2022-03-04 DIAGNOSIS — N390 Urinary tract infection, site not specified: Secondary | ICD-10-CM | POA: Diagnosis not present

## 2022-03-04 HISTORY — PX: IR IMAGING GUIDED PORT INSERTION: IMG5740

## 2022-03-04 IMAGING — XA IR IMAGING GUIDED PORT INSERTION
1 series · 2 of 2 positions shown · non-contrast
Comparison: none

INDICATION: Chemotherapy

[Series 1: ir fluoro/shunt/fist · 2 of 2 slices shown]
[im 1/2]
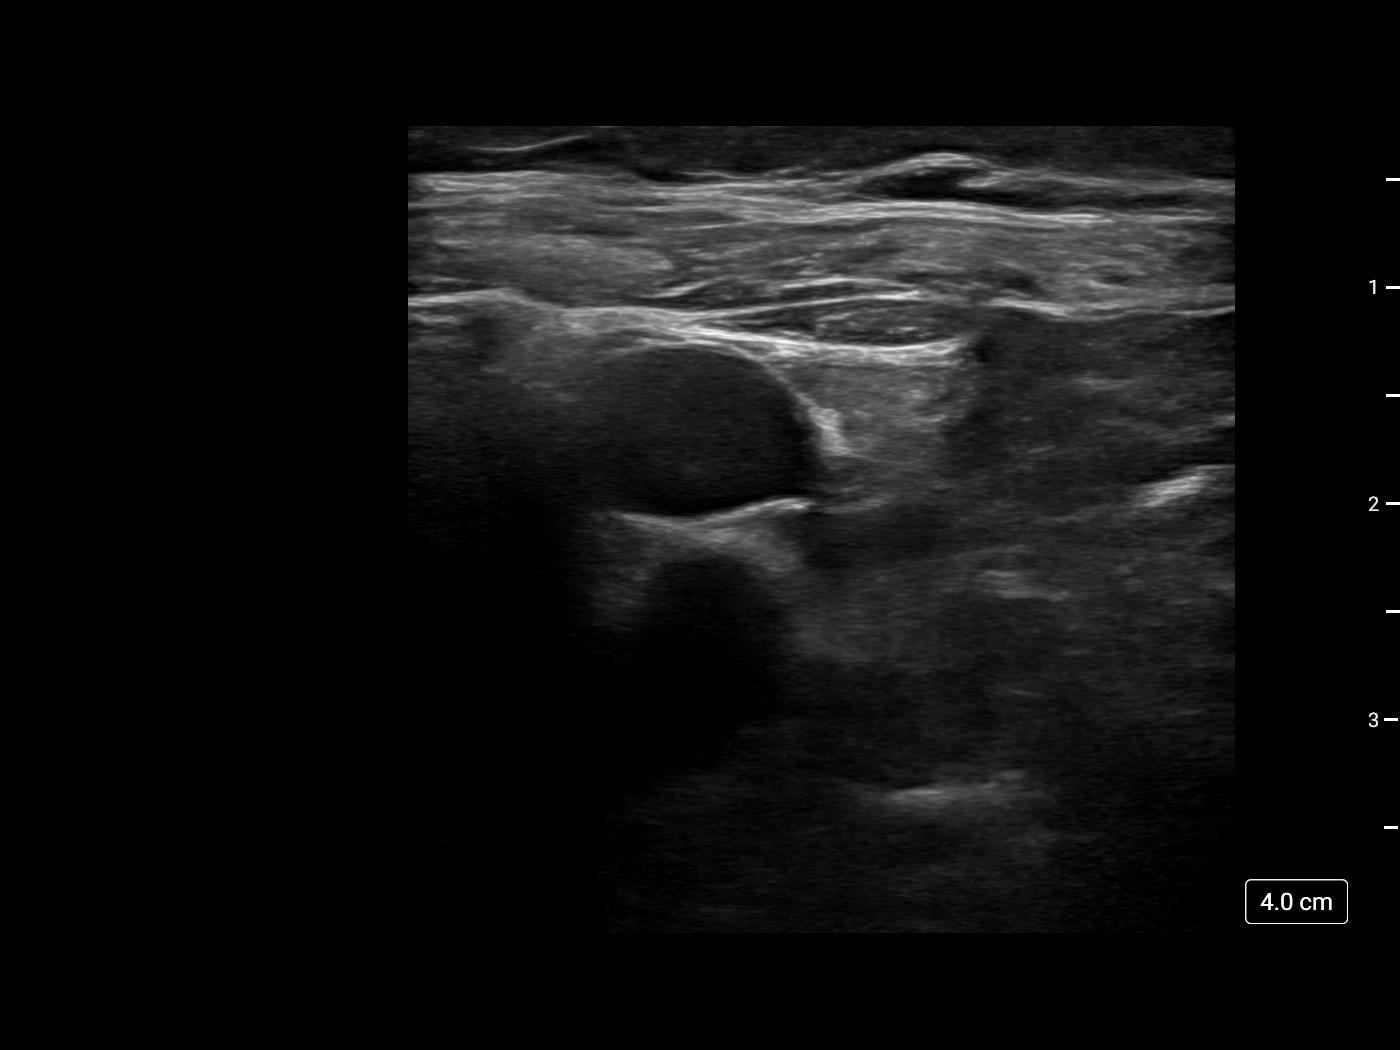
[im 2/2]
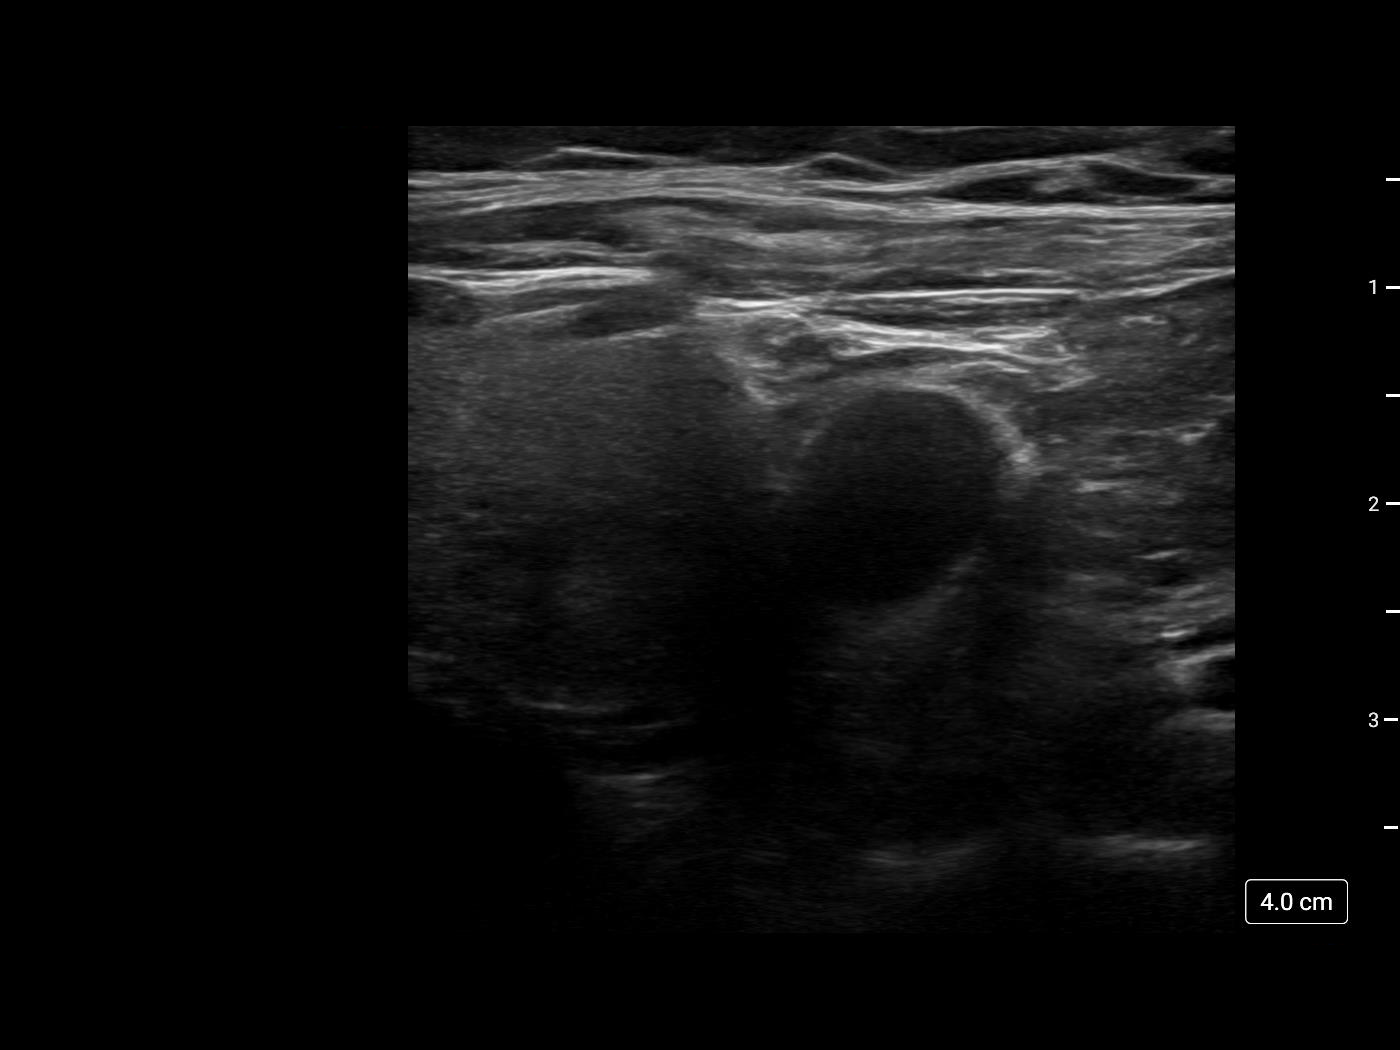

[2 of 2 positions shown; findings below may reference images not displayed]

EXAM:
Ultrasound-guided puncture of the right internal jugular vein

Placement of a right-sided chest port using fluoroscopic guidance

MEDICATIONS:
None

ANESTHESIA/SEDATION:
Moderate (conscious) sedation was employed during this procedure. A
total of Versed 1 mg and Fentanyl 50 mcg was administered
intravenously.

Moderate Sedation Time: 30 minutes. The patient's level of
consciousness and vital signs were monitored continuously by
radiology nursing throughout the procedure under my direct
supervision.

FLUOROSCOPY TIME:  Fluoroscopy Time: 0.3 minutes (2 mGy)

COMPLICATIONS:
None immediate.

PROCEDURE:
Informed written consent was obtained from the patient after a
thorough discussion of the procedural risks, benefits and
alternatives. All questions were addressed. Maximal Sterile Barrier
Technique was utilized including caps, mask, sterile gowns, sterile
gloves, sterile drape, hand hygiene and skin antiseptic. A timeout
was performed prior to the initiation of the procedure.

The patient was placed supine on the exam table. The right neck and
chest was prepped and draped in the standard sterile fashion. A
preliminary ultrasound of the right neck was performed and
demonstrates a patent right internal jugular vein. A permanent
ultrasound image was stored in the electronic medical record. The
overlying skin was anesthetized with 1% Lidocaine. Using ultrasound
guidance, access was obtained into the right internal jugular vein
using a 21 gauge micropuncture set. A wire was advanced into the
SVC, a short incision was made at the puncture site, and serial
dilatation performed. Next, in an ipsilateral infraclavicular
location, an incision was made at the site of the subcutaneous
reservoir. Blunt dissection was used to open a pocket to contain the
reservoir. A subcutaneous tunnel was then created from the port site
to the puncture site. A(n) 8 Fr single lumen catheter was advanced
through the tunnel. The catheter was attached to the port and this
was placed in the subcutaneous pocket. Under fluoroscopic guidance,
a peel away sheath was placed, and the catheter was trimmed to the
appropriate length and was advanced into the central veins. The
catheter length is 23 cm. The tip of the catheter lies near the
superior cavoatrial junction. The port flushes and aspirates
appropriately. The port was flushed and locked with heparinized
saline. The port pocket was closed in 2 layers using 3-0 and 4-0
Vicryl/absorbable suture. Dermabond was also applied to both
incisions. The patient tolerated the procedure well and was
transferred to recovery in stable condition.
IMPRESSION: Successful placement of a right-sided chest port via the right
internal jugular vein. The port is ready for immediate use.

## 2022-03-04 MED ORDER — FENTANYL CITRATE (PF) 100 MCG/2ML IJ SOLN
INTRAMUSCULAR | Status: AC
  Filled 2022-03-04: qty 2

## 2022-03-04 MED ORDER — MIDAZOLAM HCL 2 MG/2ML IJ SOLN
INTRAMUSCULAR | Status: AC
  Filled 2022-03-04: qty 2

## 2022-03-04 MED ORDER — MIDAZOLAM HCL 2 MG/2ML IJ SOLN
INTRAMUSCULAR | Status: AC | PRN
  Administered 2022-03-04: 1 mg via INTRAVENOUS

## 2022-03-04 MED ORDER — HEPARIN SOD (PORK) LOCK FLUSH 100 UNIT/ML IV SOLN
INTRAVENOUS | Status: AC
  Filled 2022-03-04: qty 5

## 2022-03-04 MED ORDER — SODIUM CHLORIDE 0.9 % IV SOLN: INTRAVENOUS | Status: DC

## 2022-03-04 MED ORDER — FENTANYL CITRATE (PF) 100 MCG/2ML IJ SOLN
INTRAMUSCULAR | Status: AC | PRN
  Administered 2022-03-04: 50 ug via INTRAVENOUS

## 2022-03-04 MED ORDER — LIDOCAINE-EPINEPHRINE 1 %-1:100000 IJ SOLN
INTRAMUSCULAR | Status: AC | PRN
  Administered 2022-03-04: 10 mL via INTRADERMAL

## 2022-03-04 MED ORDER — HEPARIN SOD (PORK) LOCK FLUSH 100 UNIT/ML IV SOLN
INTRAVENOUS | Status: AC | PRN
  Administered 2022-03-04: 500 [IU] via INTRAVENOUS

## 2022-03-04 MED ORDER — LIDOCAINE-EPINEPHRINE 1 %-1:100000 IJ SOLN
INTRAMUSCULAR | Status: AC
  Filled 2022-03-04: qty 1

## 2022-03-04 NOTE — Discharge Instructions (Signed)

## 2022-03-04 NOTE — Procedures (Signed)
Interventional Radiology Procedure Note ? ?Date of Procedure: 03/04/2022  ?Procedure: Port placement  ? ?Findings:  ?1. Right chest port placement via right IJ   ? ?Complications: No immediate complications noted.  ? ?Estimated Blood Loss: minimal ? ?Follow-up and Recommendations: ?1. Ready for use  ? ? ?Albin Felling, MD  ?Vascular & Interventional Radiology  ?03/04/2022 12:34 PM ? ? ? ?

## 2022-03-05 ENCOUNTER — Telehealth: Payer: Self-pay

## 2022-03-05 ENCOUNTER — Other Ambulatory Visit: Payer: Self-pay

## 2022-03-05 ENCOUNTER — Emergency Department (HOSPITAL_COMMUNITY): Payer: Medicare Other

## 2022-03-05 ENCOUNTER — Encounter (HOSPITAL_COMMUNITY): Payer: Self-pay | Admitting: Family Medicine

## 2022-03-05 ENCOUNTER — Inpatient Hospital Stay (HOSPITAL_COMMUNITY)
Admission: EM | Admit: 2022-03-05 | Discharge: 2022-03-08 | DRG: 674 | Disposition: A | Discharge status: Home Health | Payer: Medicare Other | Attending: Internal Medicine | Admitting: Internal Medicine

## 2022-03-05 DIAGNOSIS — G9349 Other encephalopathy: Secondary | ICD-10-CM | POA: Diagnosis present

## 2022-03-05 DIAGNOSIS — Z87440 Personal history of urinary (tract) infections: Secondary | ICD-10-CM | POA: Diagnosis not present

## 2022-03-05 DIAGNOSIS — R4182 Altered mental status, unspecified: Principal | ICD-10-CM

## 2022-03-05 DIAGNOSIS — N3000 Acute cystitis without hematuria: Secondary | ICD-10-CM

## 2022-03-05 DIAGNOSIS — G9340 Encephalopathy, unspecified: Secondary | ICD-10-CM

## 2022-03-05 DIAGNOSIS — D496 Neoplasm of unspecified behavior of brain: Secondary | ICD-10-CM | POA: Diagnosis present

## 2022-03-05 DIAGNOSIS — Z833 Family history of diabetes mellitus: Secondary | ICD-10-CM | POA: Diagnosis not present

## 2022-03-05 DIAGNOSIS — C8589 Other specified types of non-Hodgkin lymphoma, extranodal and solid organ sites: Secondary | ICD-10-CM | POA: Diagnosis present

## 2022-03-05 DIAGNOSIS — Z888 Allergy status to other drugs, medicaments and biological substances status: Secondary | ICD-10-CM

## 2022-03-05 DIAGNOSIS — Z79899 Other long term (current) drug therapy: Secondary | ICD-10-CM | POA: Diagnosis not present

## 2022-03-05 DIAGNOSIS — N390 Urinary tract infection, site not specified: Principal | ICD-10-CM | POA: Diagnosis present

## 2022-03-05 DIAGNOSIS — C8339 Primary central nervous system lymphoma: Secondary | ICD-10-CM | POA: Diagnosis present

## 2022-03-05 DIAGNOSIS — R7303 Prediabetes: Secondary | ICD-10-CM | POA: Diagnosis present

## 2022-03-05 DIAGNOSIS — Z7983 Long term (current) use of bisphosphonates: Secondary | ICD-10-CM | POA: Diagnosis not present

## 2022-03-05 DIAGNOSIS — Z20822 Contact with and (suspected) exposure to covid-19: Secondary | ICD-10-CM | POA: Diagnosis present

## 2022-03-05 DIAGNOSIS — Z885 Allergy status to narcotic agent status: Secondary | ICD-10-CM | POA: Diagnosis not present

## 2022-03-05 DIAGNOSIS — I10 Essential (primary) hypertension: Secondary | ICD-10-CM | POA: Diagnosis present

## 2022-03-05 DIAGNOSIS — B954 Other streptococcus as the cause of diseases classified elsewhere: Secondary | ICD-10-CM | POA: Diagnosis present

## 2022-03-05 DIAGNOSIS — B9620 Unspecified Escherichia coli [E. coli] as the cause of diseases classified elsewhere: Secondary | ICD-10-CM | POA: Diagnosis present

## 2022-03-05 DIAGNOSIS — Z8249 Family history of ischemic heart disease and other diseases of the circulatory system: Secondary | ICD-10-CM

## 2022-03-05 DIAGNOSIS — Z87891 Personal history of nicotine dependence: Secondary | ICD-10-CM | POA: Diagnosis not present

## 2022-03-05 LAB — URINALYSIS, ROUTINE W REFLEX MICROSCOPIC
Bilirubin Urine: NEGATIVE
Glucose, UA: 50 mg/dL — AB
Hgb urine dipstick: NEGATIVE
Ketones, ur: NEGATIVE mg/dL
Nitrite: NEGATIVE
Protein, ur: NEGATIVE mg/dL
Specific Gravity, Urine: 1.011 (ref 1.005–1.030)
pH: 6 (ref 5.0–8.0)

## 2022-03-05 LAB — CBC WITH DIFFERENTIAL/PLATELET
Abs Immature Granulocytes: 0.11 10*3/uL — ABNORMAL HIGH (ref 0.00–0.07)
Basophils Absolute: 0 10*3/uL (ref 0.0–0.1)
Basophils Relative: 0 %
Eosinophils Absolute: 0 10*3/uL (ref 0.0–0.5)
Eosinophils Relative: 0 %
HCT: 48.5 % — ABNORMAL HIGH (ref 36.0–46.0)
Hemoglobin: 15.2 g/dL — ABNORMAL HIGH (ref 12.0–15.0)
Immature Granulocytes: 1 %
Lymphocytes Relative: 15 %
Lymphs Abs: 1.1 10*3/uL (ref 0.7–4.0)
MCH: 28.6 pg (ref 26.0–34.0)
MCHC: 31.3 g/dL (ref 30.0–36.0)
MCV: 91.2 fL (ref 80.0–100.0)
Monocytes Absolute: 0.3 10*3/uL (ref 0.1–1.0)
Monocytes Relative: 4 %
Neutro Abs: 6.1 10*3/uL (ref 1.7–7.7)
Neutrophils Relative %: 80 %
Platelets: 199 10*3/uL (ref 150–400)
RBC: 5.32 MIL/uL — ABNORMAL HIGH (ref 3.87–5.11)
RDW: 14.4 % (ref 11.5–15.5)
WBC: 7.6 10*3/uL (ref 4.0–10.5)
nRBC: 0 % (ref 0.0–0.2)

## 2022-03-05 LAB — COMPREHENSIVE METABOLIC PANEL
ALT: 23 U/L (ref 0–44)
AST: 17 U/L (ref 15–41)
Albumin: 3.7 g/dL (ref 3.5–5.0)
Alkaline Phosphatase: 58 U/L (ref 38–126)
Anion gap: 8 (ref 5–15)
BUN: 24 mg/dL — ABNORMAL HIGH (ref 8–23)
CO2: 29 mmol/L (ref 22–32)
Calcium: 8.9 mg/dL (ref 8.9–10.3)
Chloride: 104 mmol/L (ref 98–111)
Creatinine, Ser: 0.75 mg/dL (ref 0.44–1.00)
GFR, Estimated: >60 mL/min (ref >60)
Glucose, Bld: 189 mg/dL — ABNORMAL HIGH (ref 70–99)
Potassium: 4.1 mmol/L (ref 3.5–5.1)
Sodium: 141 mmol/L (ref 135–145)
Total Bilirubin: 0.7 mg/dL (ref 0.3–1.2)
Total Protein: 6.4 g/dL — ABNORMAL LOW (ref 6.5–8.1)

## 2022-03-05 LAB — RESP PANEL BY RT-PCR (FLU A&B, COVID) ARPGX2
Influenza A by PCR: NEGATIVE
Influenza B by PCR: NEGATIVE
SARS Coronavirus 2 by RT PCR: NEGATIVE

## 2022-03-05 LAB — LACTIC ACID, PLASMA
Lactic Acid, Venous: 2.1 mmol/L (ref 0.5–1.9)
Lactic Acid, Venous: 0.9 mmol/L (ref 0.5–1.9)

## 2022-03-05 LAB — GLUCOSE, CAPILLARY: Glucose-Capillary: 114 mg/dL — ABNORMAL HIGH (ref 70–99)

## 2022-03-05 IMAGING — DX DG CHEST 1V PORT
1 series · 1 of 1 positions shown · non-contrast
Comparison: [DATE]

CLINICAL DATA: Altered mental status.

EXAM:
PORTABLE CHEST 1 VIEW

[chest ap]
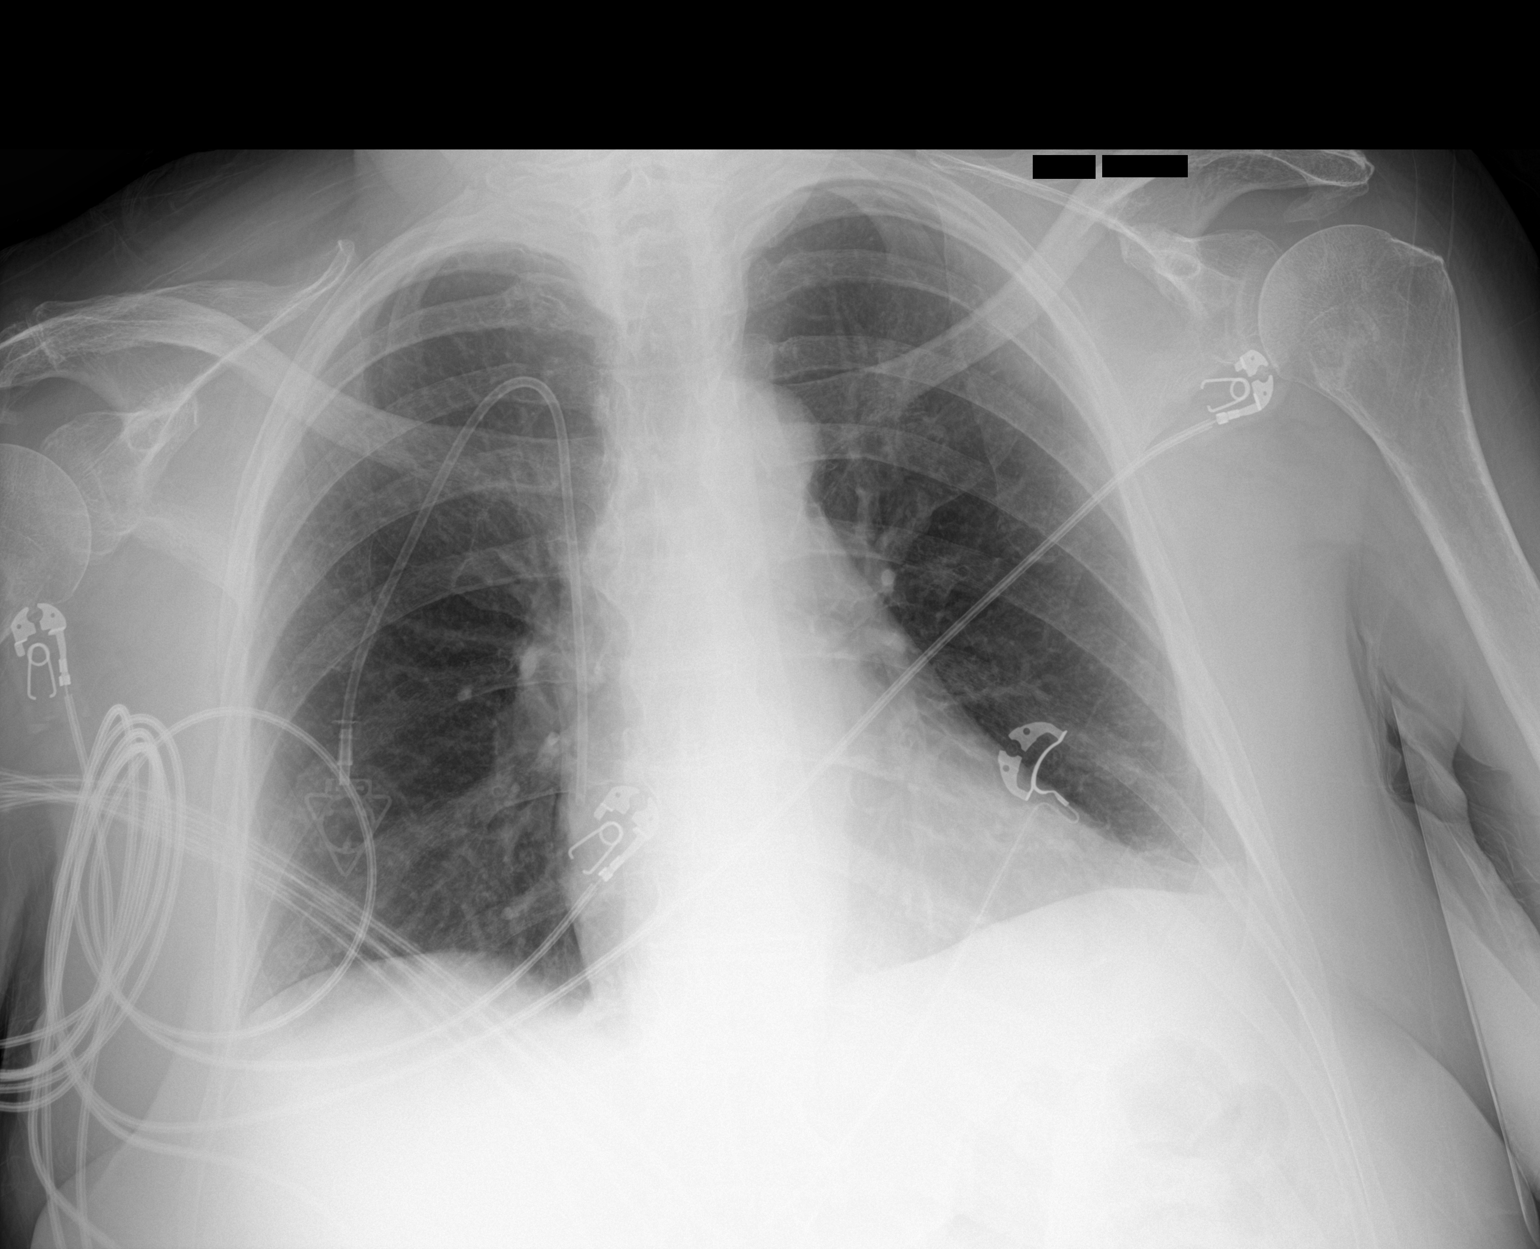

[1 of 1 positions shown; findings below may reference images not displayed]

FINDINGS: A right-sided venous Port-A-Cath is seen with its distal tip noted
at the junction of the superior vena cava and right atrium. The
heart size and mediastinal contours are within normal limits. Both
lungs are clear. The visualized skeletal structures are
unremarkable.
IMPRESSION: 1. Right-sided venous Port-A-Cath positioning, as described above.
2. No acute cardiopulmonary disease.

## 2022-03-05 IMAGING — MR MR HEAD WO/W CM
15 series · 48 of 48 positions shown · IV contrast (gadavist)
Comparison: Prior CT from earlier the same day as well as previous
MRI from [DATE].

CLINICAL DATA: Follow-up examination for CNS neoplasm.

EXAM:
MRI HEAD WITHOUT AND WITH CONTRAST
TECHNIQUE: Multiplanar, multiecho pulse sequences of the brain and surrounding
structures were obtained without and with intravenous contrast.
CONTRAST:  9mL GADAVIST GADOBUTROL 1 MMOL/ML IV SOLN

[Series 9: DWI · axial · 3.0mm · 1.36mm/px · z∈[-51,+95]mm · 6 of 99 slices shown (1 of 2)]
[im 1/99]
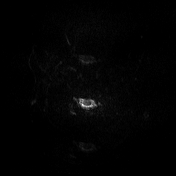
[im 20/99]
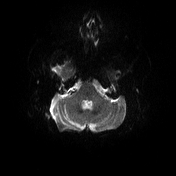
[im 40/99]
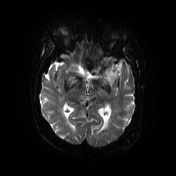
[im 59/99]
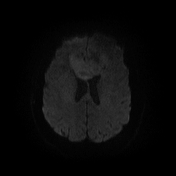
[im 79/99]
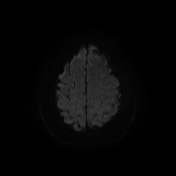
[im 99/99]
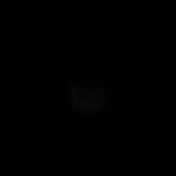

[Series 10: DWI · axial · 3.0mm · 1.36mm/px · z∈[-51,+86]mm · 3 of 45 slices shown (2 of 2)]
[im 1/45]
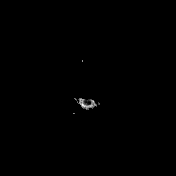
[im 23/45]
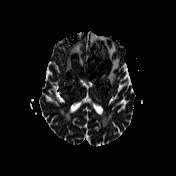
[im 45/45]
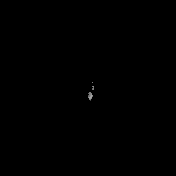

[Series 11: T1 · sagittal · 5.0mm · 0.75mm/px · 1 of 24 slices shown (1 of 4)]
[im 1/24]
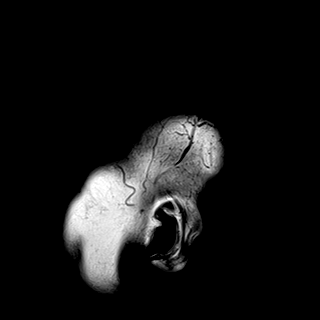

[Series 12: T2 · axial · 5.0mm · 0.62mm/px · z∈[-58,+103]mm · 2 of 26 slices shown]
[im 1/26]
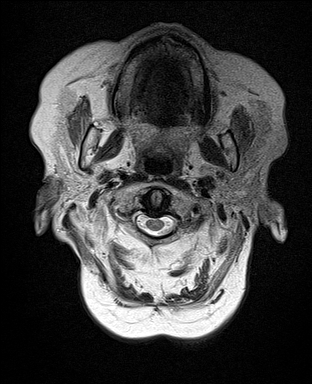
[im 26/26]
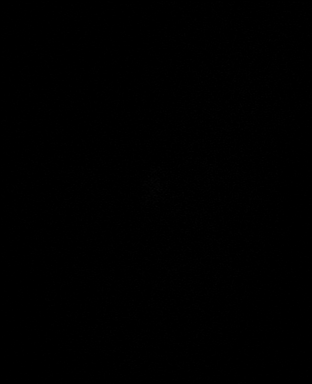

[Series 13: swi_images · axial · 3.0mm · 0.75mm/px · z∈[-59,+104]mm · 3 of 56 slices shown]
[im 1/56]
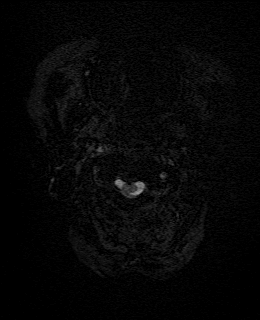
[im 28/56]
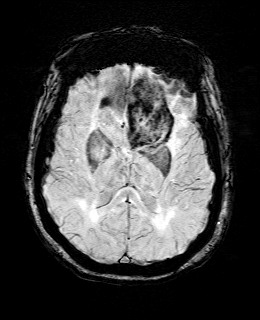
[im 56/56]
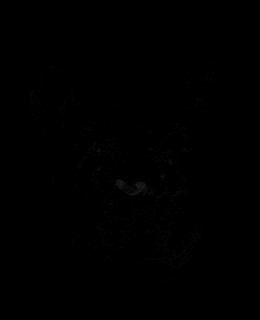

[Series 15: FLAIR · axial · 3.0mm · 0.75mm/px · z∈[-53,+98]mm · 3 of 52 slices shown]
[im 1/52]
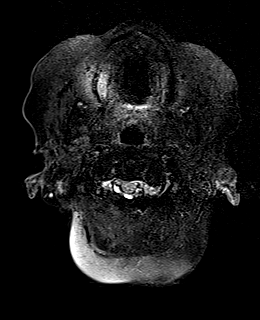
[im 26/52]
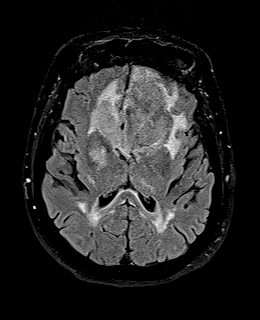
[im 52/52]
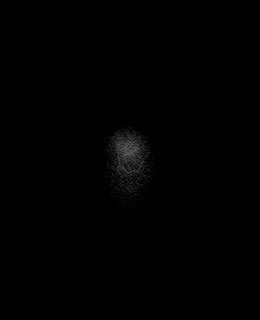

[Series 16: T1 · axial · 1.0mm · 0.94mm/px · z∈[-45,+97]mm · 8 of 144 slices shown (2 of 4)]
[im 1/144]
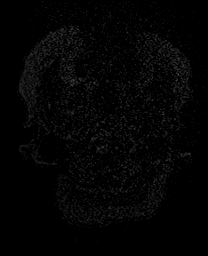
[im 21/144]
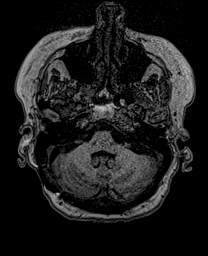
[im 41/144]
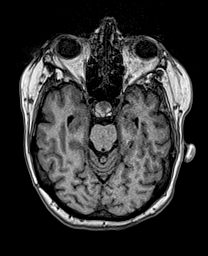
[im 62/144]
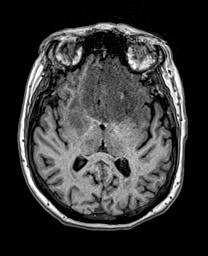
[im 82/144]
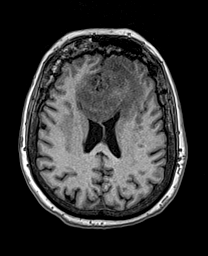
[im 103/144]
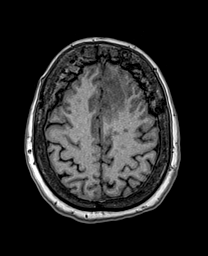
[im 123/144]
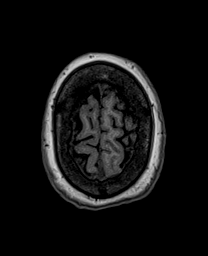
[im 144/144]
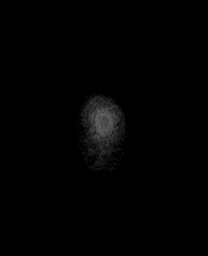

[Series 17: cor dwi_tracew · coronal · 5.0mm · 1.53mm/px · 3 of 56 slices shown]
[im 1/56]
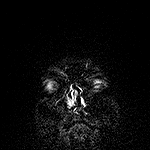
[im 28/56]
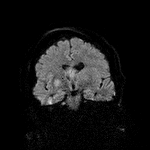
[im 56/56]
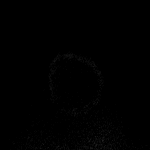

[Series 18: cor dwi_adc · coronal · 5.0mm · 1.53mm/px · 2 of 26 slices shown]
[im 1/26]
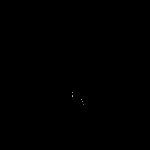
[im 26/26]
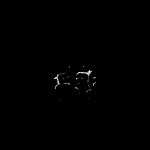

[Series 19: T2 post-contrast · coronal · 5.0mm · 0.57mm/px · 2 of 28 slices shown]
[im 1/28]
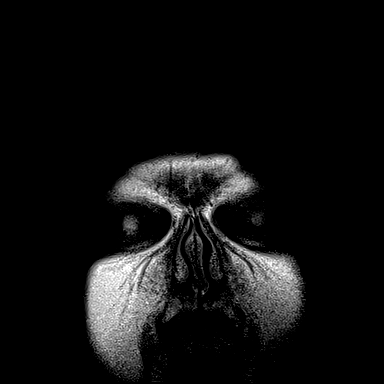
[im 28/28]
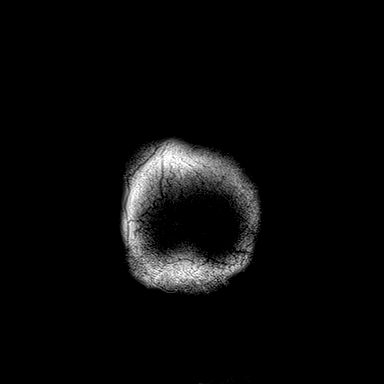

[Series 20: T1 post-contrast · axial · 1.0mm · 0.94mm/px · z∈[-45,+97]mm · 8 of 144 slices shown (1 of 3)]
[im 1/144]
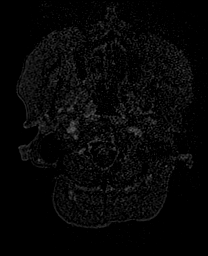
[im 21/144]
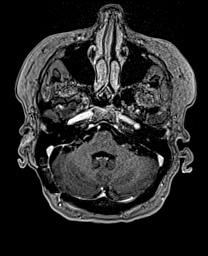
[im 41/144]
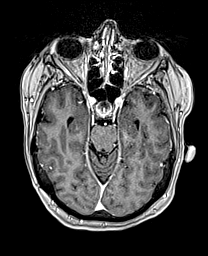
[im 62/144]
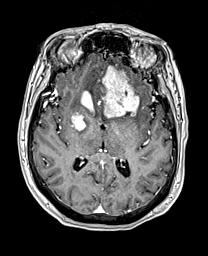
[im 82/144]
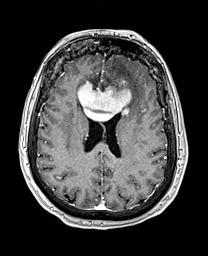
[im 103/144]
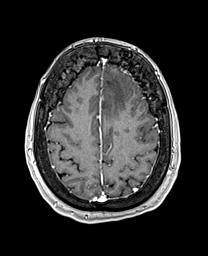
[im 123/144]
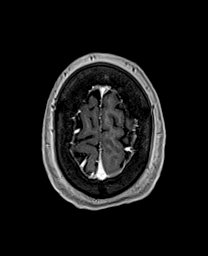
[im 144/144]
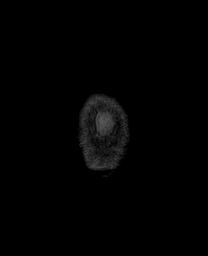

[Series 21: T1 · sagittal · 4.0mm · 0.94mm/px · 2 of 36 slices shown (3 of 4)]
[im 1/36]
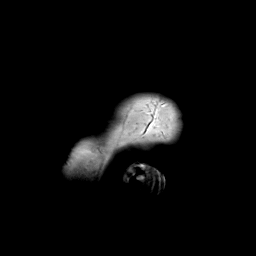
[im 36/36]
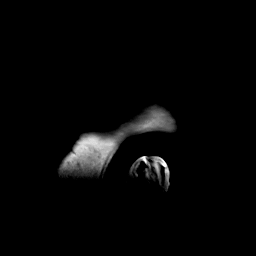

[Series 22: T1 · coronal · 4.0mm · 0.94mm/px · 2 of 43 slices shown (4 of 4)]
[im 1/43]
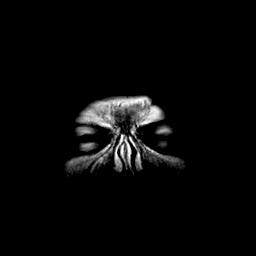
[im 43/43]
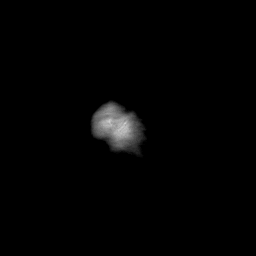

[Series 23: T1 post-contrast · coronal · 5.0mm · 0.43mm/px · 2 of 28 slices shown (2 of 3)]
[im 1/28]
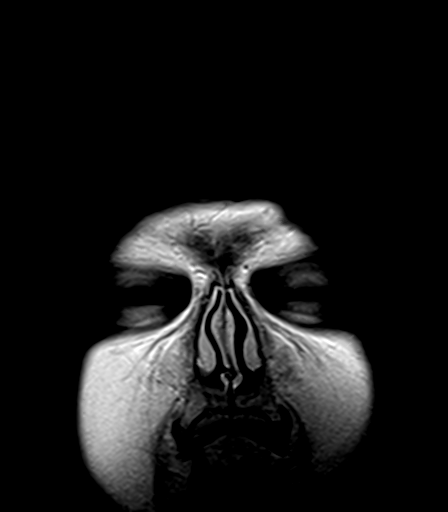
[im 28/28]
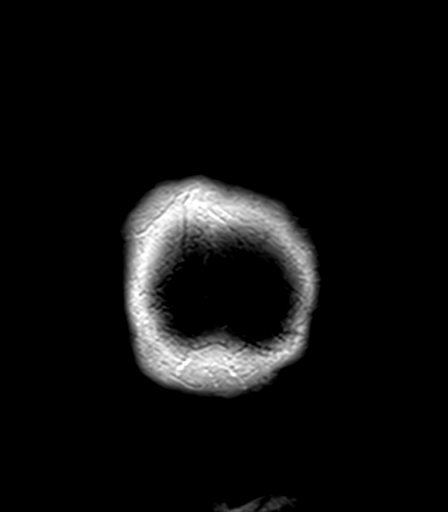

[Series 24: T1 post-contrast · sagittal · 5.0mm · 0.75mm/px · 1 of 24 slices shown (3 of 3)]
[im 1/24]
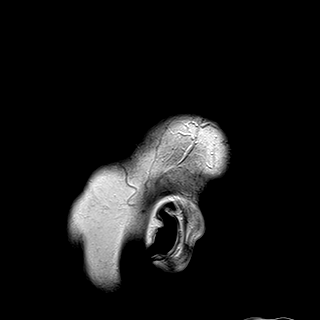

[48 of 48 positions shown; findings below may reference images not displayed]

FINDINGS: Brain: There has been interval progression and worsening of disease
with increased size of the bifrontal mass as compared to most recent
MRI from [DATE]. The site of the initial lesion at the anterior
inferior left frontal lobe has increased in size, now measuring
x 3.4 cm in this region, previously 3.5 x 2.5 cm when measured in a
similar fashion. Bulky tumor extends superiorly, and crosses the
midline across the anterior genu of the corpus callosum, with
involvement of the contralateral right frontal lobe. This as all
increased in size from previous. Superior extension into the left
greater than right frontal lobes superiorly has also progressed
(series 20, image 89). Overall, size of the primary lesion has more
than doubled in size from prior. Associated enhancement is more
pronounced and increased from prior. Additionally, associated
susceptibility artifact consistent with blood products is also
increased from prior. Progressive mass effect on the anterior aspect
of the lateral ventricles anteriorly. No hydrocephalus or trapping.
Associated vasogenic edema within the anterior inferior left greater
than right frontal lobes has also progressed. Localized
left-to-right shift of up to approximately 7 mm noted along the
anterior/inferior falx.

An additional site of lymphomatous deposit again seen within the
right basal ganglia, measuring 1.5 x 1.6 cm on today's exam (series
20, image 62), increased in size and now demonstrating more solid
and prominent enhancement. Additional involvement of the
contralateral anterior left basal ganglia is also more prominent and
progressed (series 20, image 79). Associated edema extending into
the left cerebral peduncle again noted.

Underlying chronic microvascular ischemic disease with small remote
right cerebellar infarct. No other evidence for acute or subacute
infarct. No other mass lesion or abnormal enhancement. No
extra-axial collections. Probable irregular postoperative scarring
and enhancement noted at the right CP angle cistern.

Vascular: Major intracranial vascular flow voids are maintained.

Skull and upper cervical spine: Craniocervical junction within
normal limits. Bone marrow signal intensity normal. No focal marrow
replacing lesion. Remote right occipital craniotomy. Hyperostosis
frontalis interna noted. No scalp soft tissue abnormality.

Sinuses/Orbits: Prior bilateral ocular lens replacement. Globes
orbital soft tissues demonstrate no acute finding. Paranasal sinuses
are largely clear. Trace left mastoid effusion noted, of doubtful
significance.

Other: None.
IMPRESSION: 1. Interval progression of disease as compared to most recent MRI
from [DATE] as evidence by increased size and enhancement of the
bifrontal mass. Associated vasogenic edema within the
anterior-inferior left greater than right frontal lobes has also
progressed and worsened, with localized 7 mm left-to-right shift now
seen along the anterior/inferior falx. Progressive mass effect on
the anterior lateral ventricles without hydrocephalus.
2. Additional site of lymphomatous deposit within the right basal
ganglia, increased in size and now demonstrating more solid and
prominent enhancement.
3. Underlying chronic microvascular ischemic disease with small
remote right cerebellar infarct.

## 2022-03-05 IMAGING — CT CT HEAD W/O CM
3 series · 14 of 47 positions shown, 16 images · non-contrast
Comparison: [DATE] MRI, correlation is also made with CT head
[DATE]

CLINICAL DATA: Altered mental status



[Series 3: head wo · axial · 0.49mm/px · z∈[+1304,+1434]mm · 8 of 32 slices shown, 10 images]
[im 3/32  brain]
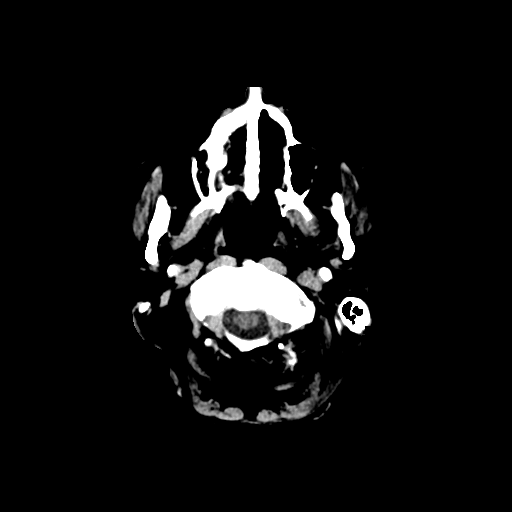
[im 3/32  bone]
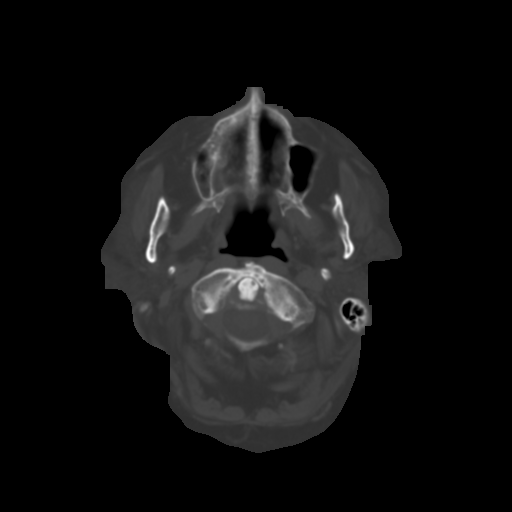
[im 7/32  brain]
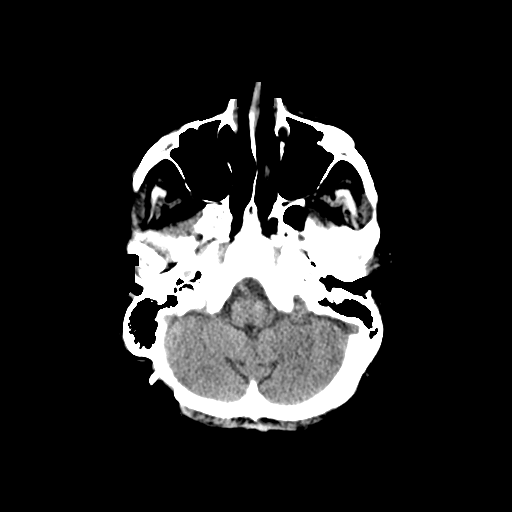
[im 10/32  brain]
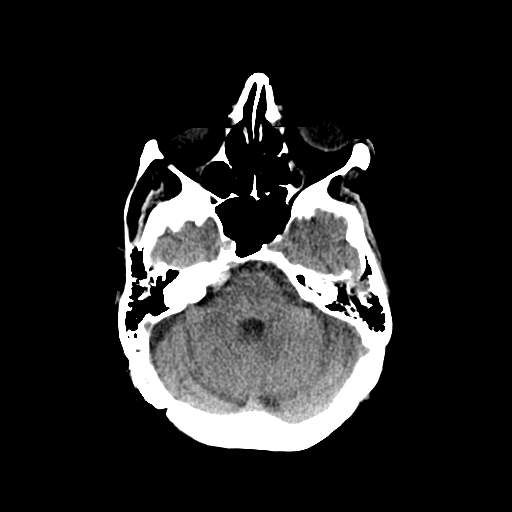
[im 14/32  brain]
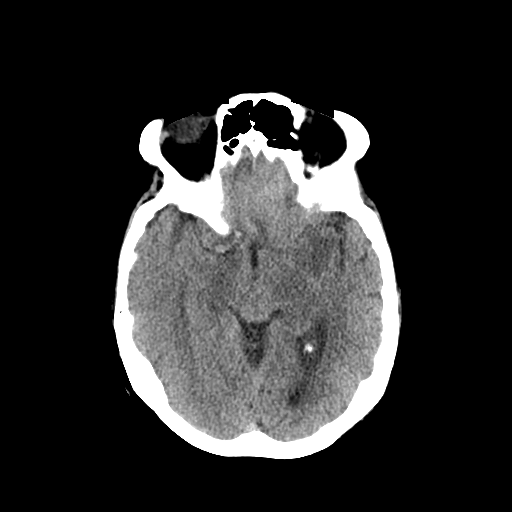
[im 18/32  brain]
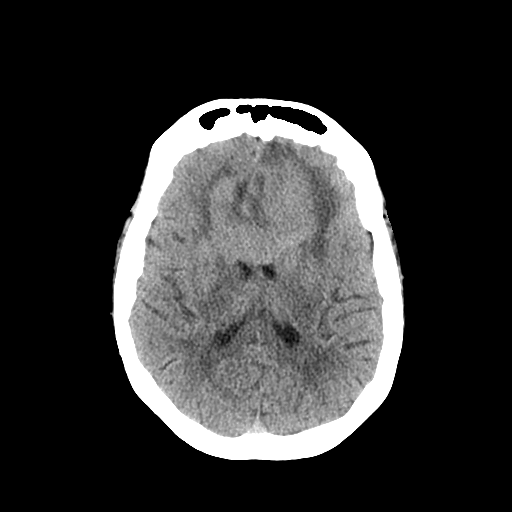
[im 18/32  bone]
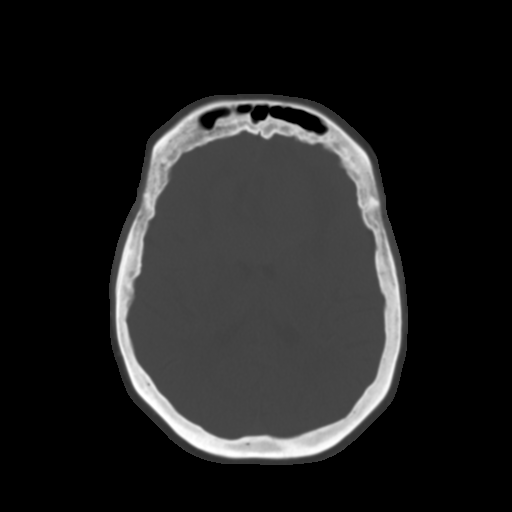
[im 22/32  brain]
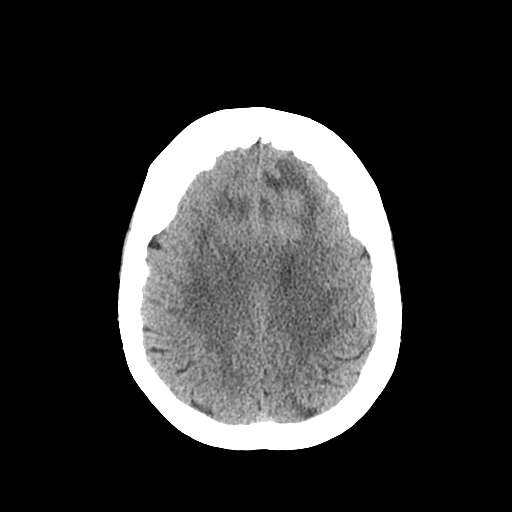
[im 25/32  brain]
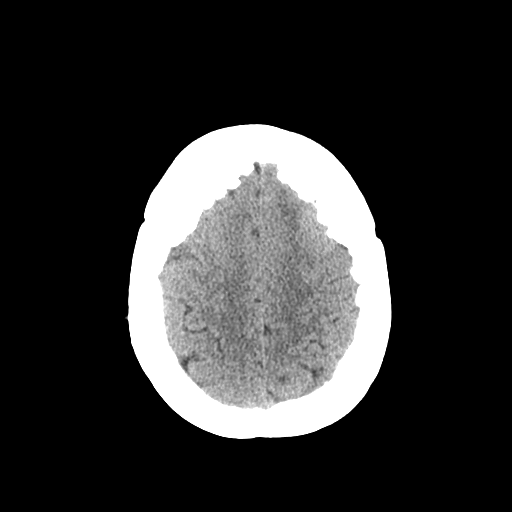
[im 29/32  brain]
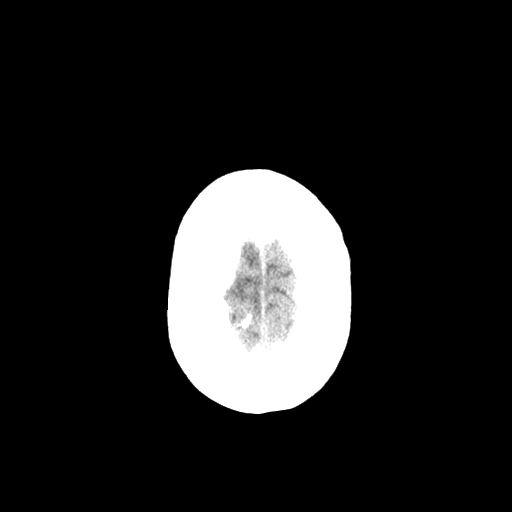

[Series 4: coronal soft tissue · coronal · 0.35mm/px · 3 of 64 slices shown]
[im 22/64  brain]
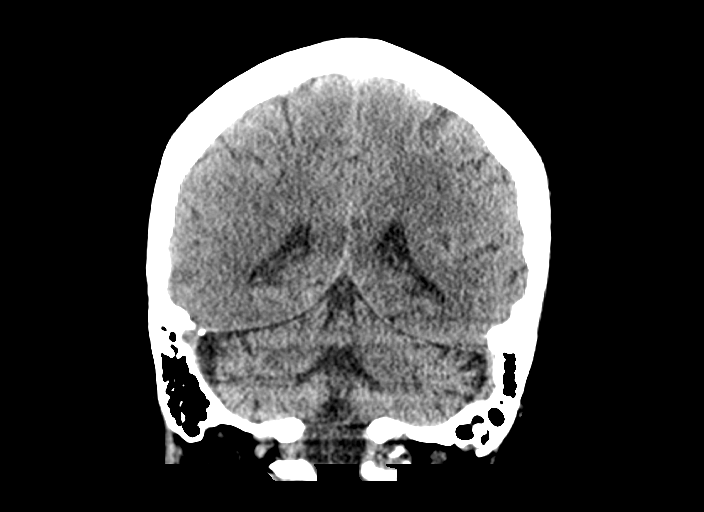
[im 29/64  brain]
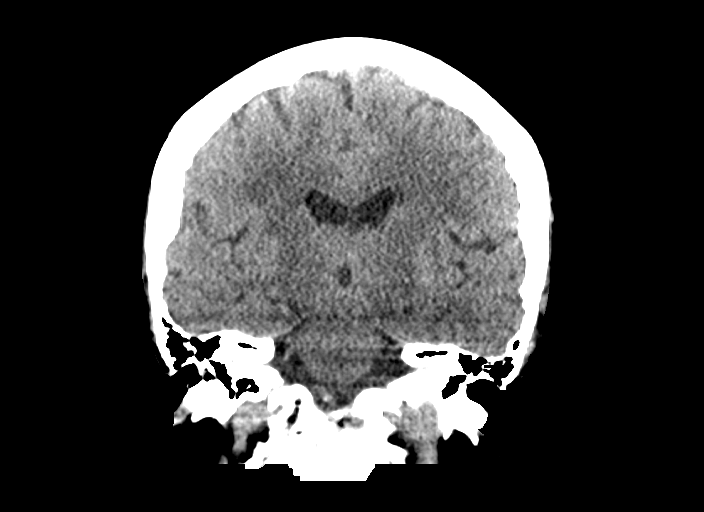
[im 36/64  brain]
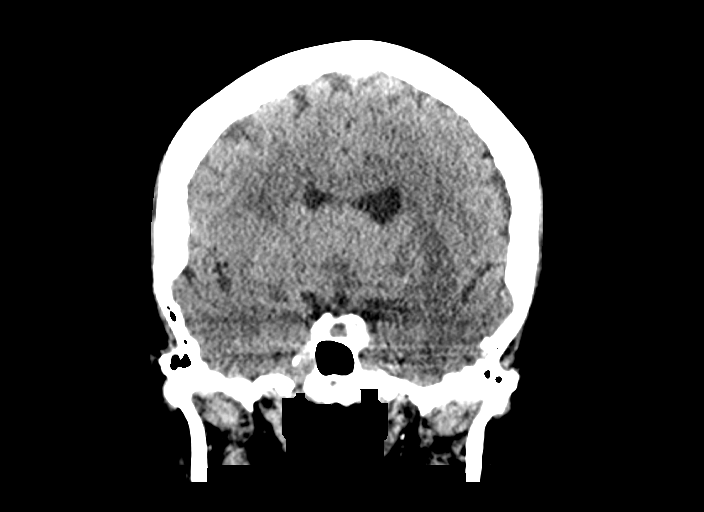

[Series 5: sagittal soft tissue · sagittal · 0.33mm/px · 3 of 49 slices shown]
[im 17/49  brain]
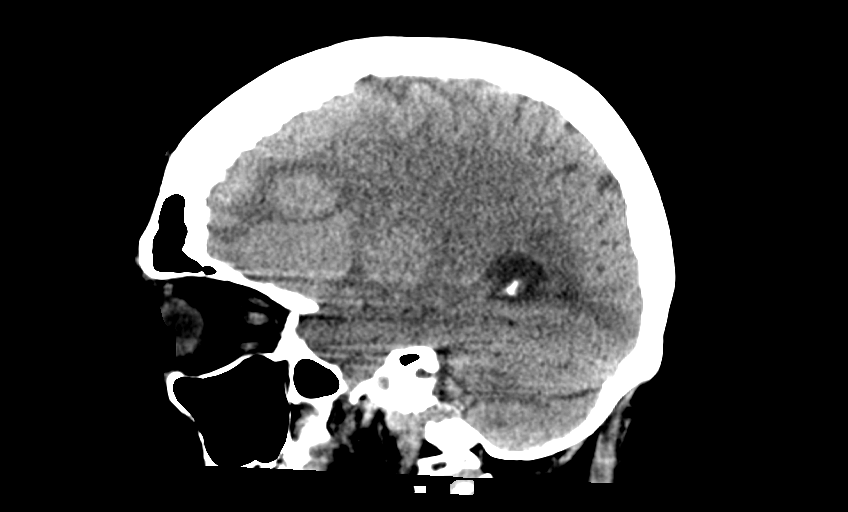
[im 25/49  brain]
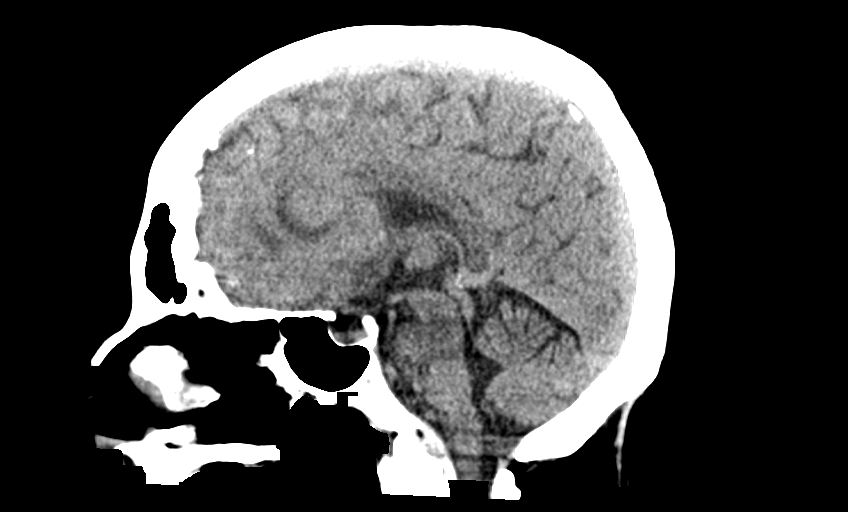
[im 33/49  brain]
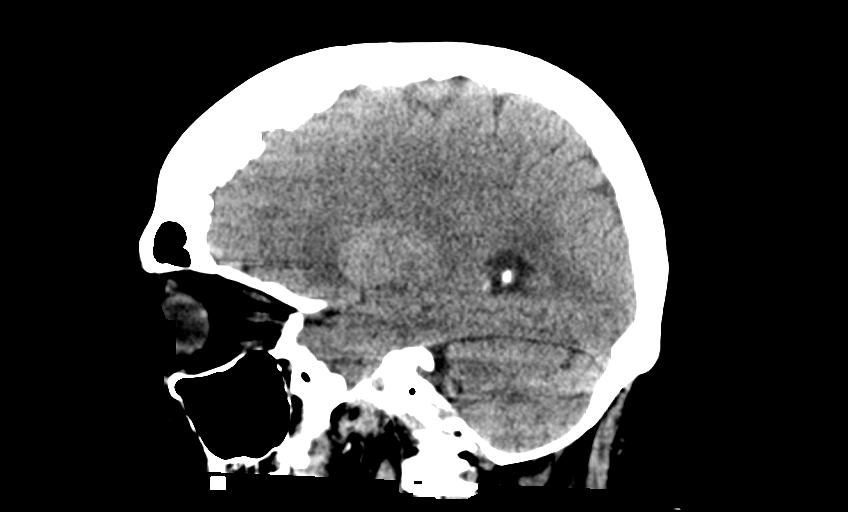

[14 of 47 positions shown; findings below may reference images not displayed]

FINDINGS: Brain: Likely further interval increase in the size of the mass
spanning the bilateral frontal lobes, which measures approximately
5.3 x 5.4 x 4.8 cm (series 3, image 17 and series 4, image 24),
previously 3.2 x 4.1 x 4.1 cm when remeasured similarly on the
[DATE] MRI, although comparison is limited by the absence of
intravenous contrast on the current study and differences in
modality. Increased surrounding hypodensity, likely edema. Increased
mass effect on the frontal horns of the lateral ventricles.

Additional previously noted lesion in the right basal ganglia has
also likely increased in size (series 3, image 17)

No definite acute infarct, hemorrhage, hydrocephalus, or extra-axial
collection.

Vascular: No hyperdense vessel.

Skull: Right occipital craniotomy. Left frontal burr hole.
Hyperostosis frontalis. No acute osseous abnormality.

Sinuses/Orbits: No acute finding. Status post bilateral lens
replacements.

Other: The mastoids are well aerated.
IMPRESSION: 1. Suspect significant increase in the size of the mass spanning the
bilateral frontal lobes, when accounting for the absence of
intravenous contrast and differences in technique between the
current study and the prior MRI. The right basal ganglia lesion has
likely also increased in size. Consider MRI with and without
contrast for better comparison.

2. No other acute intracranial process.

## 2022-03-05 MED ORDER — ACETAMINOPHEN 650 MG RE SUPP
650.0000 mg | Freq: Four times a day (QID) | RECTAL | Status: DC | PRN
Start: 2022-03-05 — End: 2022-03-08

## 2022-03-05 MED ORDER — GADOBUTROL 1 MMOL/ML IV SOLN
9.0000 mL | Freq: Once | INTRAVENOUS | Status: AC | PRN
  Administered 2022-03-05: 9 mL via INTRAVENOUS

## 2022-03-05 MED ORDER — INSULIN ASPART 100 UNIT/ML IJ SOLN
0.0000 [IU] | Freq: Three times a day (TID) | INTRAMUSCULAR | Status: DC
  Administered 2022-03-06 (×2): 2 [IU] via SUBCUTANEOUS
  Administered 2022-03-06 (×2): 1 [IU] via SUBCUTANEOUS
  Administered 2022-03-07: 3 [IU] via SUBCUTANEOUS
  Administered 2022-03-07: 2 [IU] via SUBCUTANEOUS
  Administered 2022-03-07: 1 [IU] via SUBCUTANEOUS
  Administered 2022-03-07: 2 [IU] via SUBCUTANEOUS
  Administered 2022-03-08 (×2): 1 [IU] via SUBCUTANEOUS
  Filled 2022-03-05: qty 0.09

## 2022-03-05 MED ORDER — ACETAMINOPHEN 325 MG PO TABS
650.0000 mg | ORAL_TABLET | Freq: Four times a day (QID) | ORAL | Status: DC | PRN
  Administered 2022-03-06 – 2022-03-07 (×2): 650 mg via ORAL
  Filled 2022-03-05 (×2): qty 2

## 2022-03-05 MED ORDER — LACTATED RINGERS IV SOLN: INTRAVENOUS | Status: DC

## 2022-03-05 MED ORDER — DEXAMETHASONE SODIUM PHOSPHATE 4 MG/ML IJ SOLN
4.0000 mg | Freq: Four times a day (QID) | INTRAMUSCULAR | Status: DC
  Administered 2022-03-05 – 2022-03-08 (×10): 4 mg via INTRAVENOUS
  Filled 2022-03-05 (×10): qty 1

## 2022-03-05 MED ORDER — SENNOSIDES-DOCUSATE SODIUM 8.6-50 MG PO TABS: 1.0000 | ORAL_TABLET | Freq: Every evening | ORAL | Status: DC | PRN

## 2022-03-05 MED ORDER — IRBESARTAN 150 MG PO TABS
150.0000 mg | ORAL_TABLET | Freq: Every day | ORAL | Status: DC
  Administered 2022-03-05 – 2022-03-07 (×3): 150 mg via ORAL
  Filled 2022-03-05 (×4): qty 1

## 2022-03-05 MED ORDER — ENOXAPARIN SODIUM 40 MG/0.4ML IJ SOSY
40.0000 mg | PREFILLED_SYRINGE | INTRAMUSCULAR | Status: DC
  Administered 2022-03-05 – 2022-03-07 (×3): 40 mg via SUBCUTANEOUS
  Filled 2022-03-05 (×3): qty 0.4

## 2022-03-05 MED ORDER — CEFTRIAXONE SODIUM 2 G IJ SOLR
2.0000 g | Freq: Once | INTRAMUSCULAR | Status: AC
Start: 2022-03-05 — End: 2022-03-05
  Administered 2022-03-05: 2 g via INTRAVENOUS
  Filled 2022-03-05: qty 20

## 2022-03-05 NOTE — ED Notes (Signed)
Unable to obtain EKG at this time d/t patient being in MRI. ?

## 2022-03-05 NOTE — ED Notes (Signed)
MRI tech at bedside.

## 2022-03-05 NOTE — Assessment & Plan Note (Signed)
Oncology consulted to see in am ?

## 2022-03-05 NOTE — Assessment & Plan Note (Signed)
Continue avapro. Monitor BP ?

## 2022-03-05 NOTE — ED Notes (Signed)
Patient to MRI.

## 2022-03-05 NOTE — Assessment & Plan Note (Signed)
Isabel Gibbs is admitted to Med Surg floor.  ?Started on Rocephin for antibiotic therapy.  ?Culture obtained in ER and will be monitored ?IVF hydration.  ?

## 2022-03-05 NOTE — Telephone Encounter (Signed)
TCT son Merry Proud to check on patient. Family contacted PCP because of increased weakness and cognitive changes.Patient instructed to take pt to ED for Evaluation. Pt is staying with son in [place], [place]. Family will bring her to [facility]. ?

## 2022-03-05 NOTE — Assessment & Plan Note (Signed)
Check labs in am to monitor glucose.  ?Expect increase in glucose with decadron use and will provide SSI coverage.  ?Check HgbA1c ?

## 2022-03-05 NOTE — Assessment & Plan Note (Signed)
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  ?Oncology consulted by ER and will see in am.  ?Given decadron per oncology recommendation ?

## 2022-03-05 NOTE — ED Provider Notes (Signed)
[facility] DEPT Provider Note   CSN: [id number] Arrival date & time: 03/05/22  1806     History  Chief Complaint  Patient presents with   Altered Mental Status    Isabel Gibbs is a 76 y.o. female.  HPI     76 year old female with a history of hypertension, prediabetes, left frontal CNS lymphoma who presents with concern for confusion, painful urination, after a port was placed yesterday.   This was diagnosed in January, she had been seen in February with modest improvement in functional status after starting Decadron, using a walker but at times does not need to going short distances, she mostly dresses and toilets herself, family felt language and speech appeared to be normal  Urine burning a bit A bit confused Generalized weakness Previously had a bad UTI Port put in chemotherapy to start next week Had original UTI when she was first diagnosed, and was startede on decadron  For the past 24hr, couldn't put clothes on anymore, talking about people who are there that aren't there, hallucinating, very weak, yesterday AM prior to procedure, got up , went to the bathroom but today very weak, fatigued, hallucinating  No fever No nausea or vomiting No abdominal or flank pain Headaches, took tylenol and it helped, not getting worse from prior   No cough, dyspnea, chest pain    Past Medical History:  Diagnosis Date   Hypertension    Prediabetes    Trigeminal neuralgia of right side of face     Home Medications Prior to Admission medications   Medication Sig Start Date End Date Taking? Authorizing Provider  acetaminophen (TYLENOL) 500 MG tablet Take 500-1,000 mg by mouth 2 (two) times daily as needed for mild pain.    [provider]  alendronate (FOSAMAX) 70 MG tablet Take 70 mg by mouth every Friday. 11/14/21   [provider]  dexamethasone (DECADRON) 4 MG tablet Take 1 tablet (4 mg total) by mouth 2 (two) times daily with  a meal. 01/27/22   Vaslow, Acey Lav, MD  gabapentin (NEURONTIN) 300 MG capsule Take 900 mg by mouth See admin instructions. Take 900 mg by mouth in the evening and at bedtime    [provider]  irbesartan (AVAPRO) 150 MG tablet Take 150 mg by mouth at bedtime.    [provider]  LORazepam (ATIVAN) 0.5 MG tablet Take 2 tablets (1 mg total) by mouth once as needed for up to 1 dose for sedation (MRI sedation). 01/30/22   Vaslow, Acey Lav, MD  SYSTANE ULTRA PF 0.4-0.3 % SOLN Place 1 drop into both eyes 3 (three) times daily as needed (for dryness).    [provider]  traMADol (ULTRAM) 50 MG tablet Take 50 mg by mouth See admin instructions. Take 50 mg by mouth in the morning and evening    [provider]  Vitamin D, Ergocalciferol, (DRISDOL) 1.25 MG (50000 UNIT) CAPS capsule Take 50,000 Units by mouth every Wednesday.    [provider]  VOLTAREN 1 % GEL Apply 2 g topically 4 (four) times daily as needed (for bilateral knee pain).    [provider]      Allergies    Codeine, Lisinopril, and Hydrocodone-acetaminophen    Review of Systems   Review of Systems  Physical Exam Updated Vital Signs BP (!) 147/54    Pulse (!) 51    Temp 97.6 F (36.4 C) (Oral)    Resp 17    Ht  5' 7.5" (1.715 m)    Wt 88 kg    SpO2 100%    BMI 29.93 kg/m  Physical Exam Vitals and nursing note reviewed.  Constitutional:      General: She is not in acute distress.    Appearance: She is well-developed. She is not diaphoretic.  HENT:     Head: Normocephalic and atraumatic.  Eyes:     Conjunctiva/sclera: Conjunctivae normal.  Cardiovascular:     Rate and Rhythm: Normal rate and regular rhythm.     Heart sounds: Normal heart sounds. No murmur heard.   No friction rub. No gallop.  Pulmonary:     Effort: Pulmonary effort is normal. No respiratory distress.     Breath sounds: Normal breath sounds. No wheezing or rales.  Abdominal:     General: There is no  distension.     Palpations: Abdomen is soft.     Tenderness: There is no abdominal tenderness. There is no guarding.  Musculoskeletal:        General: No tenderness.     Cervical back: Normal range of motion.  Skin:    General: Skin is warm and dry.     Findings: No erythema or rash.  Neurological:     Mental Status: She is alert and oriented to person, place, and time.    ED Results / Procedures / Treatments   Labs (all labs ordered are listed, but only abnormal results are displayed) Labs Reviewed  CBC WITH DIFFERENTIAL/PLATELET - Abnormal; Notable for the following components:      Result Value   RBC 5.32 (*)    Hemoglobin 15.2 (*)    HCT 48.5 (*)    Abs Immature Granulocytes 0.11 (*)    All other components within normal limits  COMPREHENSIVE METABOLIC PANEL - Abnormal; Notable for the following components:   Glucose, Bld 189 (*)    BUN 24 (*)    Total Protein 6.4 (*)    All other components within normal limits  LACTIC ACID, PLASMA - Abnormal; Notable for the following components:   Lactic Acid, Venous 2.1 (*)    All other components within normal limits  URINALYSIS, ROUTINE W REFLEX MICROSCOPIC - Abnormal; Notable for the following components:   APPearance HAZY (*)    Glucose, UA 50 (*)    Leukocytes,Ua MODERATE (*)    Bacteria, UA MANY (*)    All other components within normal limits  GLUCOSE, CAPILLARY - Abnormal; Notable for the following components:   Glucose-Capillary 114 (*)    All other components within normal limits  RESP PANEL BY RT-PCR (FLU A&B, COVID) ARPGX2  CULTURE, BLOOD (ROUTINE X 2)  CULTURE, BLOOD (ROUTINE X 2)  URINE CULTURE  LACTIC ACID, PLASMA  HEMOGLOBIN X6P  BASIC METABOLIC PANEL  CBC    EKG None  Radiology CT Head Wo Contrast  Result Date: 03/05/2022 CLINICAL DATA:  Altered mental status EXAM: CT HEAD WITHOUT CONTRAST TECHNIQUE: Contiguous axial images were obtained from the base of the skull through the vertex without intravenous  contrast. RADIATION DOSE REDUCTION: This exam was performed according to the departmental dose-optimization program which includes automated exposure control, adjustment of the mA and/or kV according to patient size and/or use of iterative reconstruction technique. COMPARISON:  02/14/2022 MRI, correlation is also made with CT head 01/02/2022 FINDINGS: Brain: Likely further interval increase in the size of the mass spanning the bilateral frontal lobes, which measures approximately 5.3 x 5.4 x 4.8 cm (series 3, image 17 and  series 4, image 24), previously 3.2 x 4.1 x 4.1 cm when remeasured similarly on the 02/14/2022 MRI, although comparison is limited by the absence of intravenous contrast on the current study and differences in modality. Increased surrounding hypodensity, likely edema. Increased mass effect on the frontal horns of the lateral ventricles. Additional previously noted lesion in the right basal ganglia has also likely increased in size (series 3, image 17) No definite acute infarct, hemorrhage, hydrocephalus, or extra-axial collection. Vascular: No hyperdense vessel. Skull: Right occipital craniotomy. Left frontal burr hole. Hyperostosis frontalis. No acute osseous abnormality. Sinuses/Orbits: No acute finding. Status post bilateral lens replacements. Other: The mastoids are well aerated. IMPRESSION: 1. Suspect significant increase in the size of the mass spanning the bilateral frontal lobes, when accounting for the absence of intravenous contrast and differences in technique between the current study and the prior MRI. The right basal ganglia lesion has likely also increased in size. Consider MRI with and without contrast for better comparison. 2. No other acute intracranial process. Electronically Signed   By: Merilyn Baba M.D.   On: 03/05/2022 19:35   MR Brain W and Wo Contrast  Result Date: 03/05/2022 CLINICAL DATA:  Follow-up examination for CNS neoplasm. EXAM: MRI HEAD WITHOUT AND WITH  CONTRAST TECHNIQUE: Multiplanar, multiecho pulse sequences of the brain and surrounding structures were obtained without and with intravenous contrast. CONTRAST:  53m GADAVIST GADOBUTROL 1 MMOL/ML IV SOLN COMPARISON:  Prior CT from earlier the same day as well as previous MRI from 02/14/2022. FINDINGS: Brain: There has been interval progression and worsening of disease with increased size of the bifrontal mass as compared to most recent MRI from 02/14/2022. The site of the initial lesion at the anterior inferior left frontal lobe has increased in size, now measuring 4.9 x 3.4 cm in this region, previously 3.5 x 2.5 cm when measured in a similar fashion. Bulky tumor extends superiorly, and crosses the midline across the anterior genu of the corpus callosum, with involvement of the contralateral right frontal lobe. This as all increased in size from previous. Superior extension into the left greater than right frontal lobes superiorly has also progressed (series 20, image 89). Overall, size of the primary lesion has more than doubled in size from prior. Associated enhancement is more pronounced and increased from prior. Additionally, associated susceptibility artifact consistent with blood products is also increased from prior. Progressive mass effect on the anterior aspect of the lateral ventricles anteriorly. No hydrocephalus or trapping. Associated vasogenic edema within the anterior inferior left greater than right frontal lobes has also progressed. Localized left-to-right shift of up to approximately 7 mm noted along the anterior/inferior falx. An additional site of lymphomatous deposit again seen within the right basal ganglia, measuring 1.5 x 1.6 cm on today's exam (series 20, image 62), increased in size and now demonstrating more solid and prominent enhancement. Additional involvement of the contralateral anterior left basal ganglia is also more prominent and progressed (series 20, image 79). Associated edema  extending into the left cerebral peduncle again noted. 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 No other evidence for acute or subacute infarct. No other mass lesion or abnormal enhancement. No extra-axial collections. Probable irregular postoperative scarring and enhancement noted at the right CP angle cistern. Vascular: Major intracranial vascular flow voids are maintained. Skull and upper cervical spine: Craniocervical junction within normal limits. Bone marrow signal intensity normal. No focal marrow replacing lesion. Remote right occipital craniotomy. Hyperostosis frontalis interna noted. No  scalp soft tissue abnormality. Sinuses/Orbits: Prior bilateral ocular lens replacement. Globes orbital soft tissues demonstrate no acute finding. Paranasal sinuses are largely clear. Trace left mastoid effusion noted, of doubtful significance. Other: None. IMPRESSION: 1. Interval progression of disease as compared to most recent MRI from 02/14/2022 as evidence by increased size and enhancement of the bifrontal mass. Associated vasogenic edema within the anterior-inferior left greater than right frontal lobes has also progressed and worsened, with localized 7 mm left-to-right shift now seen along the anterior/inferior falx. Progressive mass effect on the anterior lateral ventricles without hydrocephalus. 2. 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 3. 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 Electronically Signed   By: Jeannine Boga M.D.   On: 03/05/2022 22:50   DG Chest Portable 1 View  Result Date: 03/05/2022 CLINICAL DATA:  Altered mental status. EXAM: PORTABLE CHEST 1 VIEW COMPARISON:  December 30, 2021 FINDINGS: A right-sided venous Port-A-Cath is seen with its distal tip noted at the junction of the  superior vena cava and right atrium. The heart size and mediastinal contours are within normal limits. Both lungs are clear. The visualized skeletal structures are unremarkable. IMPRESSION: 1. Right-sided venous Port-A-Cath positioning, as described above. 2. No acute cardiopulmonary disease. Electronically Signed   By: Virgina Norfolk M.D.   On: 03/05/2022 19:31   IR IMAGING GUIDED PORT INSERTION  Result Date: 03/04/2022 INDICATION: Chemotherapy EXAM: Ultrasound-guided puncture of the right internal jugular vein Placement of a right-sided chest port using fluoroscopic guidance MEDICATIONS: None ANESTHESIA/SEDATION: Moderate (conscious) sedation was employed during this procedure. A total of Versed 1 mg and Fentanyl 50 mcg was administered intravenously. Moderate Sedation Time: 30 minutes. The patient's level of consciousness and vital signs were monitored continuously by radiology nursing throughout the procedure under my direct supervision. FLUOROSCOPY TIME:  Fluoroscopy Time: 0.3 minutes (2 mGy) COMPLICATIONS: None immediate. PROCEDURE: Informed written consent was obtained from the patient after a thorough discussion of the procedural risks, benefits and alternatives. All questions were addressed. Maximal Sterile Barrier Technique was utilized including caps, mask, sterile gowns, sterile gloves, sterile drape, hand hygiene and skin antiseptic. A timeout was performed prior to the initiation of the procedure. The patient was placed supine on the exam table. The right neck and chest was prepped and draped in the standard sterile fashion. A preliminary ultrasound of the right neck was performed and demonstrates a patent right internal jugular vein. A permanent ultrasound image was stored in the electronic medical record. The overlying skin was anesthetized with 1% Lidocaine. Using ultrasound guidance, access was obtained into the right internal jugular vein using a 21 gauge micropuncture set. A wire was  advanced into the SVC, a short incision was made at the puncture site, and serial dilatation performed. Next, in an ipsilateral infraclavicular location, an incision was made at the site of the subcutaneous reservoir. Blunt dissection was used to open a pocket to contain the reservoir. A subcutaneous tunnel was then created from the port site to the puncture site. A(n) 8 Fr single lumen catheter was advanced through the tunnel. The catheter was attached to the port and this was placed in the subcutaneous pocket. Under fluoroscopic guidance, a peel away sheath was placed, and the catheter was trimmed to the appropriate length and was advanced into the central veins. The catheter length is 23 cm. The tip of the catheter lies near the superior cavoatrial  junction. The port flushes and aspirates appropriately. The port was flushed and locked with heparinized saline. The port pocket was closed in 2 layers using 3-0 and 4-0 Vicryl/absorbable suture. Dermabond was also applied to both incisions. The patient tolerated the procedure well and was transferred to recovery in stable condition. IMPRESSION: Successful placement of a right-sided chest port via the right internal jugular vein. The port is ready for immediate use. Electronically Signed   By: Albin Felling M.D.   On: 03/04/2022 13:52    Procedures Procedures    Medications Ordered in ED Medications  dexamethasone (DECADRON) injection 4 mg (4 mg Intravenous Given 03/05/22 2253)  irbesartan (AVAPRO) tablet 150 mg (150 mg Oral Given 03/05/22 2253)  insulin aspart (novoLOG) injection 0-9 Units (0 Units Subcutaneous Not Given 03/05/22 2258)  enoxaparin (LOVENOX) injection 40 mg (40 mg Subcutaneous Given 03/05/22 2253)  acetaminophen (TYLENOL) tablet 650 mg (has no administration in time range)    Or  acetaminophen (TYLENOL) suppository 650 mg (has no administration in time range)  lactated ringers infusion ( Intravenous New Bag/Given 03/05/22 2256)  senna-docusate  (Senokot-S) tablet 1 tablet (has no administration in time range)  cefTRIAXone (ROCEPHIN) 2 g in sodium chloride 0.9 % 100 mL IVPB (0 g Intravenous Stopped 03/05/22 2046)  gadobutrol (GADAVIST) 1 MMOL/ML injection 9 mL (9 mLs Intravenous Contrast Given 03/05/22 2136)    ED Course/ Medical Decision Making/ A&P                           Medical Decision Making Amount and/or Complexity of Data Reviewed Labs: ordered. Radiology: ordered.  Risk Prescription drug management. Decision regarding hospitalization.    76 year old female with a history of hypertension, prediabetes, left frontal CNS lymphoma who presents with concern for confusion, generalized weakness, painful urination, after a port was placed yesterday.   Differential diagnosis includes anemia, electrolyte abnormality, cardiac abnormality, infection, hypothyroidism, other toxic/metabolic abnormalities, progression lymphoma.  Urinalysis shows many bacteria and white blood cells consistent with urinary tract infection, particularly in setting of urinary symptoms. Chest x-ray shows right-sided venous Port-A-Cath with distal tip noted at the junction of the superior vena cava and right atrium. CBC showed no sign of anemia or leukocytosis.  Electrolytes WNL.  Patient does not have chest pain, and have low suspicion for cardiac etiology of symptoms.  CT head completed shows increase in size of mass/CNS lymphoma.  Symptoms likely secondary to delirium and encephalopathy secondary to urinary tract infection with additional concern of increasing size of CNS lymphoma. Discussed with Dr. Mickeal Skinner surgical Neuro-Oncology.  Plan was to begin chemo in hospital on Monday.  Given rocephin for UTI, will schedule decadron '4mg'$  qid per Dr. Mickeal Skinner recommendations, obtain MRI brain WWO contrast.         Final Clinical Impression(s) / ED Diagnoses Final diagnoses:  Altered mental status, unspecified altered mental status type  Urinary tract  infection without hematuria, site unspecified    Rx / DC Orders ED Discharge Orders     None         Gareth Morgan, MD 03/06/22 [phone]

## 2022-03-05 NOTE — ED Triage Notes (Signed)
Family states pt had a port placed yesterday. States pt started becoming confused last night. States pt is talking about people who are not around. Pt complains of painful urination. ?

## 2022-03-05 NOTE — Telephone Encounter (Signed)
Son called to say that patient had an increase in confusion, weakness, and standing since port placement yesterday. She is calling out to people in the room who are not there per her son. Patient had an episode of incontinence last night. She had a urinary tract infection three weeks ago.  ? ?Port site without redness and no fever presently. ?Encouraged son to contact PCP to evaluate for UTI.  ?

## 2022-03-05 NOTE — H&P (Signed)
?History and Physical  ? ? ?Patient: Isabel Gibbs JEH:[id number] DOB: 07-27-1946 ?DOA: 03/05/2022 ?DOS: the patient was seen and examined on 03/05/2022 ?PCP: Karleen Hampshire., MD  ?Patient coming from: Home ? ?Chief Complaint:  ?Chief Complaint  ?Patient presents with  ? Altered Mental Status  ? ?HPI: Isabel Gibbs is a 76 y.o. female with medical history significant of hypertension, prediabetes, left frontal CNS lymphoma who presents with concern for confusion, painful urination. She had a port a cath placed yesterday. No fever overnight or today. Son at bedside who provides history with his mother.  Reportedly she had some confusion today at home.  She ambulates with a walker at home and can do basic things like go to the bathroom and dress herself normally.  Today she was more confused and unable to get up on her own or go to the bathroom by herself.  She had a period this afternoon of talking to people that were not there and seeing things that were not present according to the son.  Has had increase in fatigue and weakness for the last 24 hours.  He has not had any fever or chills.  No report of nausea vomiting or diarrhea.  She does report burning with urination that started last night.  She has not had any blood in her urine or discharge.  She denies abdominal pain or flank pain.  She has been having headaches which is not uncommon for her.  She has not had any cough or chest pain. ?She lives with 1 son.  The other son is present currently at bedside.  No tobacco alcohol or illicit drug use ? ?Review of Systems: As mentioned in the history of present illness. All other systems reviewed and are negative. ?Past Medical History:  ?Diagnosis Date  ? Hypertension   ? Prediabetes   ? 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 ?Past Surgical History:  ?Procedure Laterality Date  ? APPLICATION OF CRANIAL NAVIGATION Left 01/02/2022  ? Procedure: APPLICATION OF CRANIAL NAVIGATION;  Surgeon: Judith Part, MD;  Location: [place];  Service: Neurosurgery;  Laterality: Left;  ? CEREBRAL MICROVASCULAR DECOMPRESSION Right 08/02/2018  ? [place]  ? FRAMELESS  BIOPSY WITH BRAINLAB Left 01/02/2022  ? Procedure: LEFT BRAIN  BIOPSY WITH Lucky Rathke;  Surgeon: Judith Part, MD;  Location: [place];  Service: Neurosurgery;  Laterality: Left;  ? IR IMAGING GUIDED PORT INSERTION  03/04/2022  ? RADIOLOGY WITH ANESTHESIA N/A 01/01/2022  ? Procedure: MRI of Brain with and without contrast;  Surgeon: Radiologist, Medication, MD;  Location: [place] [facility];  Service: Radiology;  Laterality: N/A;  ? ?Social History:  reports that she quit smoking about 23 years ago. Her smoking use included cigarettes. She has never used smokeless tobacco. No history on file for alcohol use and drug use. ? ?Allergies  ?Allergen Reactions  ? Codeine Other (See Comments)  ?  Severe GI upset ?  ? Lisinopril Cough  ?   ?  ? Hydrocodone-Acetaminophen Nausea Only  ? ? ?Family History  ?Problem Relation Age of Onset  ? Heart failure Brother   ? Diabetes Brother   ? Yves Dill Parkinson White syndrome Son   ? ? ?Prior to Admission medications   ?Medication Sig Start Date End Date Taking? Authorizing Provider  ?acetaminophen (TYLENOL) 500 MG tablet Take 500-1,000 mg by mouth 2 (two) times daily as needed for mild pain.    [provider]  ?alendronate (FOSAMAX) 70 MG tablet Take 70  mg by mouth every Friday. 11/14/21   [provider]  ?dexamethasone (DECADRON) 4 MG tablet Take 1 tablet (4 mg total) by mouth 2 (two) times daily with a meal. 01/27/22   Vaslow, Acey Lav, MD  ?gabapentin (NEURONTIN) 300 MG capsule Take 900 mg by mouth See admin instructions. Take 900 mg by mouth in the evening and at bedtime    [provider]  ?irbesartan (AVAPRO) 150 MG tablet Take 150 mg by mouth at bedtime.    [provider]  ?LORazepam (ATIVAN) 0.5 MG tablet Take 2 tablets (1 mg total) by mouth once as needed for up to 1 dose for sedation (MRI sedation). 01/30/22   Vaslow, Acey Lav,  MD  ?Merri Brunette PF 0.4-0.3 % SOLN Place 1 drop into both eyes 3 (three) times daily as needed (for dryness).    [provider]  ?traMADol (ULTRAM) 50 MG tablet Take 50 mg by mouth See admin instructions. Take 50 mg by mouth in the morning and evening    [provider]  ?Vitamin D, Ergocalciferol, (DRISDOL) 1.25 MG (50000 UNIT) CAPS capsule Take 50,000 Units by mouth every Wednesday.    [provider]  ?VOLTAREN 1 % GEL Apply 2 g topically 4 (four) times daily as needed (for bilateral knee pain).    [provider]  ? ? ?Physical Exam: ?Vitals:  ? 03/05/22 1900 03/05/22 1930 03/05/22 2000 03/05/22 2030  ?BP: 118/67 123/70 134/70 124/71  ?Pulse: 66 64 (!) 59 (!) 59  ?Resp: '15 14 12 19  '$ ?Temp:      ?SpO2: 96% 98% 96% 98%  ?Weight:      ?Height:      ? ?General: WDWN, Alert and oriented to self and place ?Eyes: EOMI, PERRL, conjunctivae normal.  Sclera nonicteric ?HENT:  [place]/AT, external ears normal.  Nares patent without epistasis.  Mucous membranes are moist.  ?Neck: Soft, normal range of motion, supple, no masses, Trachea midline ?Respiratory: clear to auscultation bilaterally, no wheezing, no crackles. Normal respiratory effort. No accessory muscle use.  ?Cardiovascular: Regular rate and rhythm, no murmurs / rubs / gallops. No extremity edema. 2+ pedal pulses. ?Abdomen: Soft, suprapubic tenderness, nondistended,  No CVA tenderness. no rebound or guarding.  No masses palpated. Bowel sounds normoactive ?Musculoskeletal: FROM. cyanosis. Normal muscle tone.  ?Skin: Warm, dry, intact no rashes ?Neurologic: CN 2-12 grossly intact.  Normal speech.  Sensation intact. ?Psychiatric:  Normal mood.  ? ?Data Reviewed: ?Lab Work: WBC 7600 hemoglobin 15.2 hematocrit 40.5 platelets 199,000 lactic acid 2.1 sodium 141 potassium 4.1 chloride 104 bicarb 29 creatinine 0.75 BUN 24 LFTs normal albumin 3.7 ?Urinalysis hazy moderate leukocytes mild glucose many bacteria. ?COVID-negative influenza A  and B negative ? ?EKG is pending ? ?Chest x-ray shows right-sided Port-A-Cath in position.  No pulmonary infiltrate, consolidation, pulmonary edema or pneumothorax.  Cardiac silhouette normal ?CT of head shows increase in size of mass in the frontal lobes that spans both lobes.  RI recommended to further evaluate which has been ordered and is pending ? ?Assessment and Plan: ?* UTI (urinary tract infection) ?Ms. Allport is admitted to Med Surg floor.  ?Started on Rocephin for antibiotic therapy.  ?Culture obtained in ER and will be monitored ?IVF hydration.  ? ?Acute encephalopathy ?Secondary to UTI and increase in CNS lymphoma size.  ?Oncology consulted by ER and will see in am.  ?Given decadron per oncology recommendation ? ?CNS lymphoma ([facility]) ?Oncology consulted to see in am ? ?Hypertension ?Continue avapro. Monitor  BP ? ?Prediabetes ?Check labs in am to monitor glucose.  ?Expect increase in glucose with decadron use and will provide SSI coverage.  ?Check HgbA1c ? ?Advance Care Planning:  Code Status:  Full Code.  Lovenox for DVT prophylaxis ? ?Consults: Oncology ? ?Family Communication: Diagnosis and plan discussed with patient and her son who is at bedside.  Son verbalized understanding.  Son and patient agree with plan to admit and treat.  Further recommendations to follow as clinically indicated ? ?Author: ?Eben Burow, MD ?03/05/2022 9:47 PM ? ?For on call review [email or web address].  ?

## 2022-03-06 LAB — BASIC METABOLIC PANEL
Anion gap: 5 (ref 5–15)
BUN: 21 mg/dL (ref 8–23)
CO2: 29 mmol/L (ref 22–32)
Calcium: 8.6 mg/dL — ABNORMAL LOW (ref 8.9–10.3)
Chloride: 105 mmol/L (ref 98–111)
Creatinine, Ser: 0.55 mg/dL (ref 0.44–1.00)
GFR, Estimated: >60 mL/min (ref >60)
Glucose, Bld: 162 mg/dL — ABNORMAL HIGH (ref 70–99)
Potassium: 4.2 mmol/L (ref 3.5–5.1)
Sodium: 139 mmol/L (ref 135–145)

## 2022-03-06 LAB — HEMOGLOBIN A1C
Hgb A1c MFr Bld: 6 % — ABNORMAL HIGH (ref 4.8–5.6)
Mean Plasma Glucose: 125.5 mg/dL

## 2022-03-06 LAB — CBC
HCT: 43.7 % (ref 36.0–46.0)
Hemoglobin: 13.5 g/dL (ref 12.0–15.0)
MCH: 28.2 pg (ref 26.0–34.0)
MCHC: 30.9 g/dL (ref 30.0–36.0)
MCV: 91.4 fL (ref 80.0–100.0)
Platelets: 191 10*3/uL (ref 150–400)
RBC: 4.78 MIL/uL (ref 3.87–5.11)
RDW: 14.2 % (ref 11.5–15.5)
WBC: 7.4 10*3/uL (ref 4.0–10.5)
nRBC: 0 % (ref 0.0–0.2)

## 2022-03-06 LAB — GLUCOSE, CAPILLARY
Glucose-Capillary: 122 mg/dL — ABNORMAL HIGH (ref 70–99)
Glucose-Capillary: 149 mg/dL — ABNORMAL HIGH (ref 70–99)
Glucose-Capillary: 190 mg/dL — ABNORMAL HIGH (ref 70–99)
Glucose-Capillary: 177 mg/dL — ABNORMAL HIGH (ref 70–99)

## 2022-03-06 MED ORDER — POLYVINYL ALCOHOL 1.4 % OP SOLN
1.0000 [drp] | Freq: Three times a day (TID) | OPHTHALMIC | Status: DC | PRN
  Filled 2022-03-06 (×2): qty 15

## 2022-03-06 MED ORDER — SODIUM CHLORIDE 0.9 % IV SOLN
1.0000 g | INTRAVENOUS | Status: DC
  Administered 2022-03-06 – 2022-03-07 (×2): 1 g via INTRAVENOUS
  Filled 2022-03-06 (×2): qty 10

## 2022-03-06 NOTE — Progress Notes (Signed)
PROGRESS NOTE  Isabel Gibbs YBO:[id number] DOB: 05-17-1946 DOA: 03/05/2022 PCP: Karleen Hampshire., MD  HPI/Recap of past 24 hours: Isabel Gibbs is a 76 y.o. female with medical history significant of hypertension, prediabetes, left frontal CNS lymphoma who presents with concern for confusion, painful urination. She had a port a cath placed yesterday. No fever overnight or today. Son at bedside who provides history with his mother.  Reportedly she had some confusion today at home.  She ambulates with a walker at home and can do basic things like go to the bathroom and dress herself normally.  Today she was more confused and unable to get up on her own or go to the bathroom by herself.  She had a period this afternoon of talking to people that were not there and seeing things that were not present according to the son.  Has had increase in fatigue and weakness for the last 24 hours.  He has not had any fever or chills.  No report of nausea vomiting or diarrhea.  She does report burning with urination that started last night.  She has not had any blood in her urine or discharge.  She denies abdominal pain or flank pain.  She has been having headaches which is not uncommon for her.  She has not had any cough or chest pain. She lives with 1 son.  The other son is present currently at bedside.  No tobacco alcohol or illicit drug use  02/04/7823: Patient was seen and examined at her bedside.  She denies any headaches or nausea or dizziness.  Mentation is back to baseline.  Assessment/Plan: Principal Problem:   UTI (urinary tract infection) Active Problems:   Acute encephalopathy   CNS lymphoma ([facility])   Hypertension   Prediabetes  Presumptive UTI (urinary tract infection)  Started on Rocephin for antibiotic therapy, continue.  Continue IVF hydration.  Urine culture in process Blood cultures x2 negative to date   Resolved acute encephalopathy likely secondary to presumptive UTI Secondary to UTI and increase  in CNS lymphoma size.  Oncology consulted by ER and will see in am.  Continue IV decadron per oncology recommendation   CNS lymphoma [facility]) Oncology consulted to see in am   Hypertension Continue avapro. Monitor BP   Prediabetes with hyperglycemia likely exacerbated by IV steroids Check labs in am to monitor glucose.  Hemoglobin A1c 6.0 on 03/05/2022. Avoid hypoglycemia  Physical debility PT OT to assess Fall precautions.   Advance Care Planning:  Code Status:  Full Code.  Lovenox for DVT prophylaxis   Consults: Oncology   Family Communication: Diagnosis and plan discussed      DVT prophylaxis: Subcu Lovenox daily  Status is: Inpatient Patient requires at least 2 midnights for further evaluation and treatment of present condition.    Objective: Vitals:   03/06/22 0305 03/06/22 0707 03/06/22 0906 03/06/22 1216  BP: 137/68 (!) 107/57 (!) 110/55 (!) 121/56  Pulse: (!) 58 (!) 54 81 62  Resp: '16 17 17 17  '$ Temp: (!) 97.3 F (36.3 C) 99.8 F (37.7 C) 98 F (36.7 C)   TempSrc: Oral  Oral   SpO2: 98% 97% 98% 97%  Weight:      Height:        Intake/Output Summary (Last 24 hours) at 03/06/2022 1624 Last data filed at 03/06/2022 1400 Gross per 24 hour  Intake 240 ml  Output 1150 ml  Net -910 ml   Filed Weights   03/05/22 1813 03/05/22 2341  Weight: 88 kg 88 kg    Exam:  General: 76 y.o. year-old female well developed well nourished in no acute distress.  Alert and oriented x3. Cardiovascular: Regular rate and rhythm with no rubs or gallops.  No thyromegaly or JVD noted.   Respiratory: Clear to auscultation with no wheezes or rales. Good inspiratory effort. Abdomen: Soft nontender nondistended with normal bowel sounds x4 quadrants. Musculoskeletal: Trace lower extremity edema bilaterally. Skin: No ulcerative lesions noted or rashes. Psychiatry: Mood is appropriate for condition and setting   Data Reviewed: CBC: Recent Labs  Lab 03/05/22 1835 03/06/22 0421   WBC 7.6 7.4  NEUTROABS 6.1  --   HGB 15.2* 13.5  HCT 48.5* 43.7  MCV 91.2 91.4  PLT 199 056   Basic Metabolic Panel: Recent Labs  Lab 03/05/22 1835 03/06/22 0421  NA 141 139  K 4.1 4.2  CL 104 105  CO2 29 29  GLUCOSE 189* 162*  BUN 24* 21  CREATININE 0.75 0.55  CALCIUM 8.9 8.6*   GFR: Estimated Creatinine Clearance: 66.7 mL/min (by C-G formula based on SCr of 0.55 mg/dL). Liver Function Tests: Recent Labs  Lab 03/05/22 1835  AST 17  ALT 23  ALKPHOS 58  BILITOT 0.7  PROT 6.4*  ALBUMIN 3.7   No results for input(s): LIPASE, AMYLASE in the last 168 hours. No results for input(s): AMMONIA in the last 168 hours. Coagulation Profile: No results for input(s): INR, PROTIME in the last 168 hours. Cardiac Enzymes: No results for input(s): CKTOTAL, CKMB, CKMBINDEX, TROPONINI in the last 168 hours. BNP (last 3 results) No results for input(s): PROBNP in the last 8760 hours. HbA1C: Recent Labs    03/05/22 2254  HGBA1C 6.0*   CBG: Recent Labs  Lab 03/05/22 2258 03/06/22 0710 03/06/22 1109  GLUCAP 114* 122* 149*   Lipid Profile: No results for input(s): CHOL, HDL, LDLCALC, TRIG, CHOLHDL, LDLDIRECT in the last 72 hours. Thyroid Function Tests: No results for input(s): TSH, T4TOTAL, FREET4, T3FREE, THYROIDAB in the last 72 hours. Anemia Panel: No results for input(s): VITAMINB12, FOLATE, FERRITIN, TIBC, IRON, RETICCTPCT in the last 72 hours. Urine analysis:    Component Value Date/Time   COLORURINE YELLOW 03/05/2022 1836   APPEARANCEUR HAZY (A) 03/05/2022 1836   LABSPEC 1.011 03/05/2022 1836   PHURINE 6.0 03/05/2022 1836   GLUCOSEU 50 (A) 03/05/2022 1836   HGBUR NEGATIVE 03/05/2022 1836   BILIRUBINUR NEGATIVE 03/05/2022 1836   KETONESUR NEGATIVE 03/05/2022 1836   PROTEINUR NEGATIVE 03/05/2022 1836   NITRITE NEGATIVE 03/05/2022 1836   LEUKOCYTESUR MODERATE (A) 03/05/2022 1836   Sepsis Labs: '@LABRCNTIP'$ (procalcitonin:4,lacticidven:4)  ) Recent Results  (from the past 240 hour(s))  Resp Panel by RT-PCR (Flu A&B, Covid) Nasopharyngeal Swab     Status: None   Collection Time: 03/05/22  6:37 PM   Specimen: Nasopharyngeal Swab; Nasopharyngeal(NP) swabs in vial transport medium  Result Value Ref Range Status   SARS Coronavirus 2 by RT PCR NEGATIVE NEGATIVE Final    Comment: (NOTE) SARS-CoV-2 target nucleic acids are NOT DETECTED.  The SARS-CoV-2 RNA is generally detectable in upper respiratory specimens during the acute phase of infection. The lowest concentration of SARS-CoV-2 viral copies this assay can detect is 138 copies/mL. A negative result does not preclude SARS-Cov-2 infection and should not be used as the sole basis for treatment or other patient management decisions. A negative result may occur with  improper specimen collection/handling, submission of specimen other than nasopharyngeal swab, presence of viral mutation(s) within the areas  targeted by this assay, and inadequate number of viral copies(<138 copies/mL). A negative result must be combined with clinical observations, patient history, and epidemiological information. The expected result is Negative.  Fact Sheet for Patients:  EntrepreneurPulse.com.au  Fact Sheet for Healthcare Providers:  IncredibleEmployment.be  This test is no t yet approved or cleared by the [place] FDA and  has been authorized for detection and/or diagnosis of SARS-CoV-2 by FDA under an Emergency Use Authorization (EUA). This EUA will remain  in effect (meaning this test can be used) for the duration of the COVID-19 declaration under Section 564(b)(1) of the Act, 21 U.S.C.section 360bbb-3(b)(1), unless the authorization is terminated  or revoked sooner.       Influenza A by PCR NEGATIVE NEGATIVE Final   Influenza B by PCR NEGATIVE NEGATIVE Final    Comment: (NOTE) The Xpert Xpress SARS-CoV-2/FLU/RSV plus assay is intended as an aid in the  diagnosis of influenza from Nasopharyngeal swab specimens and should not be used as a sole basis for treatment. Nasal washings and aspirates are unacceptable for Xpert Xpress SARS-CoV-2/FLU/RSV testing.  Fact Sheet for Patients: EntrepreneurPulse.com.au  Fact Sheet for Healthcare Providers: IncredibleEmployment.be  This test is not yet approved or cleared by the [place] FDA and has been authorized for detection and/or diagnosis of SARS-CoV-2 by FDA under an Emergency Use Authorization (EUA). This EUA will remain in effect (meaning this test can be used) for the duration of the COVID-19 declaration under Section 564(b)(1) of the Act, 21 U.S.C. section 360bbb-3(b)(1), unless the authorization is terminated or revoked.  Performed at [facility], [place] [address], [place], [place] [address]   Blood culture (routine x 2)     Status: None (Preliminary result)   Collection Time: 03/05/22  7:00 PM   Specimen: BLOOD  Result Value Ref Range Status   Specimen Description   Final    BLOOD RIGHT ANTECUBITAL Performed at [facility] [address], [place], [place] [address]    Special Requests   Final    BOTTLES DRAWN AEROBIC AND ANAEROBIC Blood Culture results may not be optimal due to an inadequate volume of blood received in culture bottles Performed at [facility] [address], [place], [place] [address]    Culture   Final    NO GROWTH < 24 HOURS Performed at [facility] [address], [place], [place] [address]    Report Status PENDING  Incomplete  Blood culture (routine x 2)     Status: None (Preliminary result)   Collection Time: 03/05/22  7:01 PM   Specimen: BLOOD  Result Value Ref Range Status   Specimen Description   Final    BLOOD LEFT ANTECUBITAL Performed at [facility] [address], [place], [place] [address]    Special Requests   Final     BOTTLES DRAWN AEROBIC AND ANAEROBIC Blood Culture adequate volume Performed at [facility] [address], [place], [place] [address]    Culture   Final    NO GROWTH < 24 HOURS Performed at [facility] [address], [place], [place] [address]    Report Status PENDING  Incomplete      Studies: CT Head Wo Contrast  Result Date: 03/05/2022 CLINICAL DATA:  Altered mental status EXAM: CT HEAD WITHOUT CONTRAST TECHNIQUE: Contiguous axial images were obtained from the base of the skull through the vertex without intravenous contrast. RADIATION DOSE REDUCTION: This exam  was performed according to the departmental dose-optimization program which includes automated exposure control, adjustment of the mA and/or kV according to patient size and/or use of iterative reconstruction technique. COMPARISON:  02/14/2022 MRI, correlation is also made with CT head 01/02/2022 FINDINGS: Brain: Likely further interval increase in the size of the mass spanning the bilateral frontal lobes, which measures approximately 5.3 x 5.4 x 4.8 cm (series 3, image 17 and series 4, image 24), previously 3.2 x 4.1 x 4.1 cm when remeasured similarly on the 02/14/2022 MRI, although comparison is limited by the absence of intravenous contrast on the current study and differences in modality. Increased surrounding hypodensity, likely edema. Increased mass effect on the frontal horns of the lateral ventricles. Additional previously noted lesion in the right basal ganglia has also likely increased in size (series 3, image 17) No definite acute infarct, hemorrhage, hydrocephalus, or extra-axial collection. Vascular: No hyperdense vessel. Skull: Right occipital craniotomy. Left frontal burr hole. Hyperostosis frontalis. No acute osseous abnormality. Sinuses/Orbits: No acute finding. Status post bilateral lens replacements. Other: The mastoids are well aerated. IMPRESSION: 1. Suspect significant increase in the size  of the mass spanning the bilateral frontal lobes, when accounting for the absence of intravenous contrast and differences in technique between the current study and the prior MRI. The right basal ganglia lesion has likely also increased in size. Consider MRI with and without contrast for better comparison. 2. No other acute intracranial process. Electronically Signed   By: Merilyn Baba M.D.   On: 03/05/2022 19:35   MR Brain W and Wo Contrast  Result Date: 03/05/2022 CLINICAL DATA:  Follow-up examination for CNS neoplasm. EXAM: MRI HEAD WITHOUT AND WITH CONTRAST TECHNIQUE: Multiplanar, multiecho pulse sequences of the brain and surrounding structures were obtained without and with intravenous contrast. CONTRAST:  39m GADAVIST GADOBUTROL 1 MMOL/ML IV SOLN COMPARISON:  Prior CT from earlier the same day as well as previous MRI from 02/14/2022. FINDINGS: Brain: There has been interval progression and worsening of disease with increased size of the bifrontal mass as compared to most recent MRI from 02/14/2022. The site of the initial lesion at the anterior inferior left frontal lobe has increased in size, now measuring 4.9 x 3.4 cm in this region, previously 3.5 x 2.5 cm when measured in a similar fashion. Bulky tumor extends superiorly, and crosses the midline across the anterior genu of the corpus callosum, with involvement of the contralateral right frontal lobe. This as all increased in size from previous. Superior extension into the left greater than right frontal lobes superiorly has also progressed (series 20, image 89). Overall, size of the primary lesion has more than doubled in size from prior. Associated enhancement is more pronounced and increased from prior. Additionally, associated susceptibility artifact consistent with blood products is also increased from prior. Progressive mass effect on the anterior aspect of the lateral ventricles anteriorly. No hydrocephalus or trapping. Associated vasogenic  edema within the anterior inferior left greater than right frontal lobes has also progressed. Localized left-to-right shift of up to approximately 7 mm noted along the anterior/inferior falx. An additional site of lymphomatous deposit again seen within the right basal ganglia, measuring 1.5 x 1.6 cm on today's exam (series 20, image 62), increased in size and now demonstrating more solid and prominent enhancement. Additional involvement of the contralateral anterior left basal ganglia is also more prominent and progressed (series 20, image 79). Associated edema extending into the left cerebral peduncle again noted. 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 No other evidence for acute or subacute infarct. No other mass lesion or abnormal enhancement. No extra-axial collections. Probable irregular postoperative scarring and enhancement noted at the right CP angle cistern. Vascular: Major intracranial vascular flow voids are maintained. Skull and upper cervical spine: Craniocervical junction within normal limits. Bone marrow signal intensity normal. No focal marrow replacing lesion. Remote right occipital craniotomy. Hyperostosis frontalis interna noted. No scalp soft tissue abnormality. Sinuses/Orbits: Prior bilateral ocular lens replacement. Globes orbital soft tissues demonstrate no acute finding. Paranasal sinuses are largely clear. Trace left mastoid effusion noted, of doubtful significance. Other: None. IMPRESSION: 1. Interval progression of disease as compared to most recent MRI from 02/14/2022 as evidence by increased size and enhancement of the bifrontal mass. Associated vasogenic edema within the anterior-inferior left greater than right frontal lobes has also progressed and worsened, with localized 7 mm left-to-right shift now seen along the anterior/inferior falx. Progressive mass effect on the anterior lateral ventricles without hydrocephalus. 2.  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 3. 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 Electronically Signed   By: Jeannine Boga M.D.   On: 03/05/2022 22:50   DG Chest Portable 1 View  Result Date: 03/05/2022 CLINICAL DATA:  Altered mental status. EXAM: PORTABLE CHEST 1 VIEW COMPARISON:  December 30, 2021 FINDINGS: A right-sided venous Port-A-Cath is seen with its distal tip noted at the junction of the superior vena cava and right atrium. The heart size and mediastinal contours are within normal limits. Both lungs are clear. The visualized skeletal structures are unremarkable. IMPRESSION: 1. Right-sided venous Port-A-Cath positioning, as described above. 2. No acute cardiopulmonary disease. Electronically Signed   By: Virgina Norfolk M.D.   On: 03/05/2022 19:31    Scheduled Meds:  dexamethasone (DECADRON) injection  4 mg Intravenous QID   enoxaparin (LOVENOX) injection  40 mg Subcutaneous Q24H   insulin aspart  0-9 Units Subcutaneous TID WC & HS   irbesartan  150 mg Oral QHS    Continuous Infusions:  cefTRIAXone (ROCEPHIN)  IV     lactated ringers 100 mL/hr at 03/06/22 0710     LOS: 1 day     Kayleen Memos, MD [facility] Pager [phone]  If 7PM-7AM, please contact night-coverage [email or web address] Password [facility] 03/06/2022, 4:24 PM

## 2022-03-06 NOTE — Progress Notes (Signed)
?  Transition of Care (TOC) Screening Note ? ? ?Patient Details  ?Name: Isabel Gibbs ?Date of Birth: [birth year] ? ? ?Transition of Care (TOC) CM/SW Contact:    ?Satya Bohall, LCSW ?Phone Number: ?03/06/2022, 11:17 AM ? ? ? ?Transition of Care Department [facility]) has reviewed patient and no TOC needs have been identified at this time. We will continue to monitor patient advancement through interdisciplinary progression rounds. If new patient transition needs arise, please place a TOC consult. ? ? ?

## 2022-03-07 LAB — GLUCOSE, CAPILLARY
Glucose-Capillary: 136 mg/dL — ABNORMAL HIGH (ref 70–99)
Glucose-Capillary: 156 mg/dL — ABNORMAL HIGH (ref 70–99)
Glucose-Capillary: 178 mg/dL — ABNORMAL HIGH (ref 70–99)
Glucose-Capillary: 201 mg/dL — ABNORMAL HIGH (ref 70–99)

## 2022-03-07 MED ORDER — LACTATED RINGERS IV SOLN: INTRAVENOUS | Status: DC

## 2022-03-07 NOTE — Evaluation (Signed)
Physical Therapy Evaluation Patient Details Name: Isabel Gibbs MRN: [id number] DOB: 1946-05-17 Today's Date: 03/07/2022  History of Present Illness  Patient is a 76 year old female admitted with confusion and painful urination. PMH: hypertension, prediabetes, left frontal CNS lymphoma (Simultaneous filing. User may not have seen previous data.)  Clinical Impression  Pt admitted as above and presenting with functional mobility limitations 2* generalized weakness, balance deficits, decreased endurance and questionable safety awareness.  Pt hopes to progress to dc home with son and would benefit from follow up HHPT to maximize IND and safety in the home as well as initial 24/7 sup/assist.  Son, present, requesting information on resources available for care at home.     Recommendations for follow up therapy are one component of a multi-disciplinary discharge planning process, led by the attending physician.  Recommendations may be updated based on patient status, additional functional criteria and insurance authorization.  Follow Up Recommendations Home health PT    Assistance Recommended at Discharge Frequent or constant Supervision/Assistance  Patient can return home with the following  A lot of help with walking and/or transfers;A little help with bathing/dressing/bathroom;Assistance with cooking/housework;Assist for transportation;Help with stairs or ramp for entrance    Equipment Recommendations None recommended by PT  Recommendations for Other Services       Functional Status Assessment Patient has had a recent decline in their functional status and demonstrates the ability to make significant improvements in function in a reasonable and predictable amount of time.     Precautions / Restrictions Precautions Precautions: Fall (Simultaneous filing. User may not have seen previous data.) Precaution Comments: urinary incontinence Restrictions Weight Bearing Restrictions: No (Simultaneous  filing. User may not have seen previous data.)      Mobility  Bed Mobility Overal bed mobility: Needs Assistance Bed Mobility: Supine to Sit     Supine to sit: HOB elevated, Min assist, Mod assist     General bed mobility comments: HOB elevated (pt sleeps in lift chair at home).  Pt requiring assist and cues to bring trunk to uprigth and use of bed pad to complete rotation to EOB sitting    Transfers Overall transfer level: Needs assistance Equipment used: Rolling walker (2 wheels) Transfers: Sit to/from Stand Sit to Stand: Min assist, +2 physical assistance, From elevated surface           General transfer comment: cues for use of UEs to self assist.  Physical assist to bring wt up and fwd, to balance in standing and to control descent    Ambulation/Gait Ambulation/Gait assistance: Min assist, Mod assist Gait Distance (Feet): 60 Feet Assistive device: Rolling walker (2 wheels) Gait Pattern/deviations: Step-through pattern, Decreased step length - right, Decreased step length - left, Shuffle, Wide base of support, Trunk flexed Gait velocity: decr     General Gait Details: cues for posture, position from RW and to increase BOS; distance ltd by incontinence of urine  Stairs            Wheelchair Mobility    Modified Rankin (Stroke Patients Only)       Balance Overall balance assessment: Needs assistance Sitting-balance support: No upper extremity supported, Feet supported Sitting balance-Leahy Scale: Fair Sitting balance - Comments: Initially drifting posteriorly but progressed to sitting unassisted on EOB   Standing balance support: Bilateral upper extremity supported Standing balance-Leahy Scale: Poor  Pertinent Vitals/Pain Pain Assessment Pain Assessment: No/denies pain    Home Living Family/patient expects to be discharged to:: Private residence (Simultaneous filing. User may not have seen previous  data.) Living Arrangements: Children (Simultaneous filing. User may not have seen previous data.) Available Help at Discharge: Family (Simultaneous filing. User may not have seen previous data.) Type of Home: House (Simultaneous filing. User may not have seen previous data.) Home Access: Ramped entrance (Simultaneous filing. User may not have seen previous data.)       Home Layout: One level (Simultaneous filing. User may not have seen previous data.) Home Equipment: Rolling Walker (2 wheels);Cane - single point;Wheelchair - manual;Shower seat (Simultaneous filing. User may not have seen previous data.) Additional Comments: Sleeps in lift recliner (Simultaneous filing. User may not have seen previous data.)    Prior Function Prior Level of Function : Independent/Modified Independent;History of Falls (last six months) (Simultaneous filing. User may not have seen previous data.)             Mobility Comments: Ambulates with rolling walker; son reports has not fallen since December (Simultaneous filing. User may not have seen previous data.) ADLs Comments: At baseline patient independent with BADL such as toileting, dressing     Hand Dominance   Dominant Hand:  (did not specify)    Extremity/Trunk Assessment   Upper Extremity Assessment Upper Extremity Assessment: Generalized weakness    Lower Extremity Assessment Lower Extremity Assessment: Generalized weakness       Communication   Communication: No difficulties (Simultaneous filing. User may not have seen previous data.)  Cognition Arousal/Alertness: Awake/alert Behavior During Therapy: WFL for tasks assessed/performed Overall Cognitive Status: Impaired/Different from baseline Area of Impairment: Memory, Following commands                       Following Commands: Follows one step commands inconsistently       General Comments: Pt providing information inconsistant with what son offers and with occasional  difficulty following commands without repetition        General Comments      Exercises     Assessment/Plan    PT Assessment Patient needs continued PT services  PT Problem List Decreased strength;Decreased activity tolerance;Decreased balance;Decreased mobility;Decreased knowledge of use of DME       PT Treatment Interventions DME instruction;Gait training;Therapeutic activities;Functional mobility training;Therapeutic exercise;Balance training;Patient/family education    PT Goals (Current goals can be found in the Care Plan section)  Acute Rehab PT Goals Patient Stated Goal: Regain IND to return home PT Goal Formulation: With patient Time For Goal Achievement: 03/21/22 Potential to Achieve Goals: Good    Frequency Min 3X/week     Co-evaluation PT/OT/SLP Co-Evaluation/Treatment: Yes Reason for Co-Treatment: For patient/therapist safety;To address functional/ADL transfers (Simultaneous filing. User may not have seen previous data.) PT goals addressed during session: Mobility/safety with mobility (Simultaneous filing. User may not have seen previous data.) OT goals addressed during session: ADL's and self-care (Simultaneous filing. User may not have seen previous data.)       AM-PAC PT "6 Clicks" Mobility  Outcome Measure Help needed turning from your back to your side while in a flat bed without using bedrails?: A Little Help needed moving from lying on your back to sitting on the side of a flat bed without using bedrails?: A Lot Help needed moving to and from a bed to a chair (including a wheelchair)?: A Lot Help needed standing up from a chair using your  arms (e.g., wheelchair or bedside chair)?: A Lot Help needed to walk in hospital room?: A Little Help needed climbing 3-5 steps with a railing? : A Lot 6 Click Score: 14    End of Session Equipment Utilized During Treatment: Gait belt Activity Tolerance: Patient tolerated treatment well;Patient limited by  fatigue Patient left: in chair;with call bell/phone within reach;with chair alarm set;with family/visitor present Nurse Communication: Mobility status PT Visit Diagnosis: Unsteadiness on feet (R26.81);Muscle weakness (generalized) (M62.81);Difficulty in walking, not elsewhere classified (R26.2)    Time: 4353-9122 PT Time Calculation (min) (ACUTE ONLY): 30 min   Charges:   PT Evaluation $PT Eval Low Complexity: 1 Low          [place] Pager [phone] Office [phone]   Jase Reep 03/07/2022, 12:26 PM

## 2022-03-07 NOTE — Progress Notes (Signed)
PROGRESS NOTE  [name] YBW:[id number] DOB: August 18, 1946 DOA: 03/05/2022 PCP: Karleen Hampshire., MD  HPI/Recap of past 24 hours: Isabel Gibbs is a 76 y.o. female with medical history significant of hypertension, prediabetes, left frontal CNS lymphoma who presents with concern for confusion, painful urination. She had a port a cath placed yesterday. No fever overnight or today. Son at bedside who provides history with his mother.  Reportedly she had some confusion today at home.  She ambulates with a walker at home and can do basic things like go to the bathroom and dress herself normally.  Today she was more confused and unable to get up on her own or go to the bathroom by herself.  She had a period this afternoon of talking to people that were not there and seeing things that were not present according to the son.  Has had increase in fatigue and weakness for the last 24 hours.  He has not had any fever or chills.  No report of nausea vomiting or diarrhea.  She does report burning with urination that started last night.  She has not had any blood in her urine or discharge.  She denies abdominal pain or flank pain.  She has been having headaches which is not uncommon for her.  She has not had any cough or chest pain. She lives with 1 son.  The other son is present currently at bedside.  No tobacco alcohol or illicit drug use  7/68/1157: Patient was seen and examined at bedside.  Her son was present in the room.  She has no new complaints.  Discussed with Dr. Mickeal Skinner via secure chat, okay to discharge to home from a neuro oncology standpoint.  Awaiting results of urine culture ID and sensitivities to narrow down antibiotics prior to discharge and to rule out any resistance to current antibiotic.  Assessment/Plan: Principal Problem:   UTI (urinary tract infection) Active Problems:   Acute encephalopathy   CNS lymphoma (HCC)   Hypertension   Prediabetes  Presumptive UTI (urinary tract infection)  Started  on Rocephin for antibiotic therapy, continue.  Continue IVF hydration.  Urine culture growing greater than 100,000 colonies of E. coli.  20,000 colonies of viridans Streptococcus.  Susceptibilities to follow. Blood cultures x2 negative to date   Resolved acute encephalopathy likely secondary to presumptive UTI Secondary to UTI and increase in CNS lymphoma size.  Oncology consulted by ER and will see in am.  Continue IV decadron per oncology recommendation   CNS lymphoma [facility]) Discussed with Dr. Mickeal Skinner via secure chat on 03/06/2022, with follow-up in the office.   Hypertension Continue avapro. Monitor BP   Prediabetes with hyperglycemia likely exacerbated by IV steroids Check labs in am to monitor glucose.  Hemoglobin A1c 6.0 on 03/05/2022. Avoid hypoglycemia  Physical debility PT OT recommended home health PT OT Continue fall precautions.   Advance Care Planning:  Code Status:  Full Code.  Lovenox for DVT prophylaxis   Consults: Oncology   Family Communication: Diagnosis and plan discussed    Disposition: Likely discharge to home with home health PT OT on 03/08/2022.  DVT prophylaxis: Subcu Lovenox daily  Status is: Inpatient Patient requires at least 2 midnights for further evaluation and treatment of present condition.    Objective: Vitals:   03/06/22 1654 03/06/22 2123 03/07/22 0529 03/07/22 1346  BP: (!) 123/58 122/68 (!) 138/50 (!) 120/51  Pulse: (!) 53 (!) 55 (!) 50 61  Resp:  '16 16 16  '$ Temp:  98.3 F (36.8 C) 98.4 F (36.9 C) 98.1 F (36.7 C) 97.8 F (36.6 C)  TempSrc:  Oral Oral Oral  SpO2: 99% 96% 97% 97%  Weight:      Height:        Intake/Output Summary (Last 24 hours) at 03/07/2022 1701 Last data filed at 03/07/2022 1500 Gross per 24 hour  Intake 2664 ml  Output 650 ml  Net 2014 ml   Filed Weights   03/05/22 1813 03/05/22 2341  Weight: 88 kg 88 kg    Exam:  General: 76 y.o. year-old female well-developed and nourished in no distress.  She  is alert and oriented x3.   Cardiovascular: Regular rate and rhythm no rubs or gallops.   Respiratory: Clear to auscultation no wheezes or rales.   Abdomen: Normal bowel sounds present. Musculoskeletal: Trace lower extremity edema bilaterally. Skin: No ulcerative lesions noted. Psychiatry: Mood is appropriate for condition and setting.   Data Reviewed: CBC: Recent Labs  Lab 03/05/22 1835 03/06/22 0421  WBC 7.6 7.4  NEUTROABS 6.1  --   HGB 15.2* 13.5  HCT 48.5* 43.7  MCV 91.2 91.4  PLT 199 812   Basic Metabolic Panel: Recent Labs  Lab 03/05/22 1835 03/06/22 0421  NA 141 139  K 4.1 4.2  CL 104 105  CO2 29 29  GLUCOSE 189* 162*  BUN 24* 21  CREATININE 0.75 0.55  CALCIUM 8.9 8.6*   GFR: Estimated Creatinine Clearance: 66.7 mL/min (by C-G formula based on SCr of 0.55 mg/dL). Liver Function Tests: Recent Labs  Lab 03/05/22 1835  AST 17  ALT 23  ALKPHOS 58  BILITOT 0.7  PROT 6.4*  ALBUMIN 3.7   No results for input(s): LIPASE, AMYLASE in the last 168 hours. No results for input(s): AMMONIA in the last 168 hours. Coagulation Profile: No results for input(s): INR, PROTIME in the last 168 hours. Cardiac Enzymes: No results for input(s): CKTOTAL, CKMB, CKMBINDEX, TROPONINI in the last 168 hours. BNP (last 3 results) No results for input(s): PROBNP in the last 8760 hours. HbA1C: Recent Labs    03/05/22 2254  HGBA1C 6.0*   CBG: Recent Labs  Lab 03/06/22 1650 03/06/22 2119 03/07/22 0756 03/07/22 1147 03/07/22 1635  GLUCAP 190* 177* 136* 156* 178*   Lipid Profile: No results for input(s): CHOL, HDL, LDLCALC, TRIG, CHOLHDL, LDLDIRECT in the last 72 hours. Thyroid Function Tests: No results for input(s): TSH, T4TOTAL, FREET4, T3FREE, THYROIDAB in the last 72 hours. Anemia Panel: No results for input(s): VITAMINB12, FOLATE, FERRITIN, TIBC, IRON, RETICCTPCT in the last 72 hours. Urine analysis:    Component Value Date/Time   COLORURINE YELLOW 03/05/2022  1836   APPEARANCEUR HAZY (A) 03/05/2022 1836   LABSPEC 1.011 03/05/2022 1836   PHURINE 6.0 03/05/2022 1836   GLUCOSEU 50 (A) 03/05/2022 1836   HGBUR NEGATIVE 03/05/2022 1836   BILIRUBINUR NEGATIVE 03/05/2022 1836   KETONESUR NEGATIVE 03/05/2022 1836   PROTEINUR NEGATIVE 03/05/2022 1836   NITRITE NEGATIVE 03/05/2022 1836   LEUKOCYTESUR MODERATE (A) 03/05/2022 1836   Sepsis Labs: '@LABRCNTIP'$ (procalcitonin:4,lacticidven:4)  ) Recent Results (from the past 240 hour(s))  Urine Culture     Status: Abnormal (Preliminary result)   Collection Time: 03/05/22  6:36 PM   Specimen: Urine, Clean Catch  Result Value Ref Range Status   Specimen Description   Final    URINE, CLEAN CATCH Performed at [facility], [place] [address], [place], [place] [address]    Special Requests   Final  NONE Performed at [facility], [place] [address], [place], [place] [address]    Culture (A)  Final    >=100,000 COLONIES/mL ESCHERICHIA COLI 20,000 COLONIES/mL VIRIDANS STREPTOCOCCUS Standardized susceptibility testing for this organism is not available. SUSCEPTIBILITIES TO FOLLOW Performed at [facility], [place] [address], [place], [place] [address]    Report Status PENDING  Incomplete  Resp Panel by RT-PCR (Flu A&B, Covid) Nasopharyngeal Swab     Status: None   Collection Time: 03/05/22  6:37 PM   Specimen: Nasopharyngeal Swab; Nasopharyngeal(NP) swabs in vial transport medium  Result Value Ref Range Status   SARS Coronavirus 2 by RT PCR NEGATIVE NEGATIVE Final    Comment: (NOTE) SARS-CoV-2 target nucleic acids are NOT DETECTED.  The SARS-CoV-2 RNA is generally detectable in upper respiratory specimens during the acute phase of infection. The lowest concentration of SARS-CoV-2 viral copies this assay can detect is 138 copies/mL. A negative result does not preclude SARS-Cov-2 infection and should not be used as the sole basis for treatment or other patient  management decisions. A negative result may occur with  improper specimen collection/handling, submission of specimen other than nasopharyngeal swab, presence of viral mutation(s) within the areas targeted by this assay, and inadequate number of viral copies(<138 copies/mL). A negative result must be combined with clinical observations, patient history, and epidemiological information. The expected result is Negative.  Fact Sheet for Patients:  EntrepreneurPulse.com.au  Fact Sheet for Healthcare Providers:  IncredibleEmployment.be  This test is no t yet approved or cleared by the [place] FDA and  has been authorized for detection and/or diagnosis of SARS-CoV-2 by FDA under an Emergency Use Authorization (EUA). This EUA will remain  in effect (meaning this test can be used) for the duration of the COVID-19 declaration under Section 564(b)(1) of the Act, 21 U.S.C.section 360bbb-3(b)(1), unless the authorization is terminated  or revoked sooner.       Influenza A by PCR NEGATIVE NEGATIVE Final   Influenza B by PCR NEGATIVE NEGATIVE Final    Comment: (NOTE) The Xpert Xpress SARS-CoV-2/FLU/RSV plus assay is intended as an aid in the diagnosis of influenza from Nasopharyngeal swab specimens and should not be used as a sole basis for treatment. Nasal washings and aspirates are unacceptable for Xpert Xpress SARS-CoV-2/FLU/RSV testing.  Fact Sheet for Patients: EntrepreneurPulse.com.au  Fact Sheet for Healthcare Providers: IncredibleEmployment.be  This test is not yet approved or cleared by the [place] FDA and has been authorized for detection and/or diagnosis of SARS-CoV-2 by FDA under an Emergency Use Authorization (EUA). This EUA will remain in effect (meaning this test can be used) for the duration of the COVID-19 declaration under Section 564(b)(1) of the Act, 21 U.S.C. section 360bbb-3(b)(1),  unless the authorization is terminated or revoked.  Performed at [facility], [place] [address], [place], [place] [address]   Blood culture (routine x 2)     Status: None (Preliminary result)   Collection Time: 03/05/22  7:00 PM   Specimen: BLOOD  Result Value Ref Range Status   Specimen Description   Final    BLOOD RIGHT ANTECUBITAL Performed at [facility] [address] [place], [place] [address]    Special Requests   Final    BOTTLES DRAWN AEROBIC AND ANAEROBIC Blood Culture results may not be optimal due to an inadequate volume of blood received in culture bottles Performed at [facility] [address], [place], [place] [address]  Culture   Final    NO GROWTH 2 DAYS Performed at [facility], [place] [address] [place], [place] [address]    Report Status PENDING  Incomplete  Blood culture (routine x 2)     Status: None (Preliminary result)   Collection Time: 03/05/22  7:01 PM   Specimen: BLOOD  Result Value Ref Range Status   Specimen Description   Final    BLOOD LEFT ANTECUBITAL Performed at [facility] [address], [place], [place] [address]    Special Requests   Final    BOTTLES DRAWN AEROBIC AND ANAEROBIC Blood Culture adequate volume Performed at [facility] [address] [place], [place] [address]    Culture   Final    NO GROWTH 2 DAYS Performed at [facility] [address], [place], [place] [address]    Report Status PENDING  Incomplete      Studies: No results found.  Scheduled Meds:  dexamethasone (DECADRON) injection  4 mg Intravenous QID   enoxaparin (LOVENOX) injection  40 mg Subcutaneous Q24H   insulin aspart  0-9 Units Subcutaneous TID WC & HS   irbesartan  150 mg Oral QHS    Continuous Infusions:  cefTRIAXone (ROCEPHIN)  IV Stopped (03/06/22 2053)   lactated ringers 50 mL/hr at 03/07/22 0530     LOS: 2 days      Kayleen Memos, MD [facility] Pager [phone]  If 7PM-7AM, please contact night-coverage [email or web address] Password [email or web address] 03/07/2022, 5:01 PM

## 2022-03-07 NOTE — Progress Notes (Signed)
Occupational Therapy Evaluation  Patient lives at home, son lives with her. Home is single level with ramp installed to enter. At baseline patient ambulatory with walker and performing basic self care tasks while son provides support for IADL tasks. Currently patient presents with balance deficits with initial posterior bias in standing needing cues and min +2 for safety. Patient progress to min +1 for ambulation and chair follow then incontinent of urine on floor, wears briefs at home. Patient needing total A for lower body dressing and to wash lower legs once seated in recliner. Due to current deficits spoke with son regarding need for 24/7 support at home until patient back to her baseline and recommendation for [facility] therapy services. Acute OT to follow.    03/07/22 1249  OT Visit Information  Last OT Received On 03/07/22  Assistance Needed +2 (for chair follow)  PT/OT/SLP Co-Evaluation/Treatment Yes  Reason for Co-Treatment To address functional/ADL transfers  PT goals addressed during session Mobility/safety with mobility  OT goals addressed during session ADL's and self-care  History of Present Illness Patient is a 76 year old female admitted with confusion and painful urination. PMH: hypertension, prediabetes, left frontal CNS lymphoma  Precautions  Precautions Fall  Precaution Comments urinary incontinence  Restrictions  Weight Bearing Restrictions No  Home Living  Family/patient expects to be discharged to: Private residence  Living Arrangements Children  Available Help at Discharge Family  Type of [place] One level  Bathroom Shower/Tub Walk-in shower  Christie (2 wheels);Cane - single point;Wheelchair - Agricultural consultant Comments Sleeps in Risk analyst  Prior Function  Prior Level of Function  Independent/Modified Independent;History of Falls (last six months)   Mobility Comments Ambulates with rolling walker; son reports has not fallen since December  ADLs Comments At baseline patient independent with BADL such as toileting, dressing  Communication  Communication No difficulties  Pain Assessment  Pain Assessment No/denies pain  Cognition  Arousal/Alertness Awake/alert  Behavior During Therapy WFL for tasks assessed/performed  Overall Cognitive Status Impaired/Different from baseline  Area of Impairment Memory;Following commands  Memory Decreased short-term memory  Following Commands Follows one step commands inconsistently  General Comments Pt providing information inconsistant with what son offers and with occasional difficulty following commands without repetition  Upper Extremity Assessment  Upper Extremity Assessment Generalized weakness  Lower Extremity Assessment  Lower Extremity Assessment Defer to PT evaluation  ADL  Overall ADL's  Needs assistance/impaired  Grooming Set up;Sitting  Upper Body Bathing Supervision/ safety;Sitting  Lower Body Bathing Sitting/lateral leans;Sit to/from stand;Maximal assistance  Lower Body Bathing Details (indicate cue type and reason) To wash legs after incontinet of urine.  Upper Body Dressing  Supervision/safety;Sitting  Lower Body Dressing Total assistance;Sitting/lateral leans;Sit to/from stand  Lower Body Dressing Details (indicate cue type and reason) Patient incontinent of urine needing new socks. Total A to doff soiled pair and don clean ones  Toilet Transfer Minimal assistance;+2 for safety/equipment;Cueing for safety;Cueing for sequencing;Rolling walker (2 wheels)  Toilet Transfer Details (indicate cue type and reason) Patient min +2 to power up to standing from elevated bed height with initial posterior lean needing cues to correct posture. [place] to +1 assist with chair follow.  Toileting- Water quality scientist and Hygiene Total assistance  Toileting - Clothing Manipulation Details (indicate  cue type and reason) Patient incontinent of urine in standing. Reports she uses briefs with pads at home because of this  Functional mobility during ADLs Minimal assistance;+2 for safety/equipment;Cueing for safety;Cueing for sequencing;Rolling walker (2 wheels)  General ADL Comments Patient needing increased assistance for self care tasks due to impaired safety, balance, activity tolerance  Bed Mobility  Overal bed mobility Needs Assistance  Bed Mobility Supine to Sit  Supine to sit HOB elevated;Min assist;Mod assist  General bed mobility comments HOB elevated (pt sleeps in lift chair at home).  Pt requiring assist and cues to bring trunk to uprigth and use of bed pad to complete rotation to EOB sitting  Balance  Overall balance assessment Needs assistance  Sitting-balance support No upper extremity supported;Feet supported  Sitting balance-Leahy Scale Fair  Postural control Posterior lean  Standing balance support Bilateral upper extremity supported  Standing balance-Leahy Scale Poor  OT - End of Session  Equipment Utilized During Treatment Gait belt;Rolling walker (2 wheels)  Activity Tolerance Patient tolerated treatment well  Patient left in chair;with call bell/phone within reach;with chair alarm set;with family/visitor present  Nurse Communication Mobility status  OT Assessment  OT Recommendation/Assessment Patient needs continued OT Services  OT Visit Diagnosis Unsteadiness on feet (R26.81);Other abnormalities of gait and mobility (R26.89);Other symptoms and signs involving cognitive function  OT Problem List Decreased strength;Decreased activity tolerance;Impaired balance (sitting and/or standing);Decreased cognition;Decreased safety awareness  OT Plan  OT Frequency (ACUTE ONLY) Min 2X/week  OT Treatment/Interventions (ACUTE ONLY) Self-care/ADL training;Therapeutic exercise;Therapeutic activities;Cognitive remediation/compensation;Balance training;Patient/family education  AM-PAC  OT "6 Clicks" Daily Activity Outcome Measure (Version 2)  Help from another person eating meals? 4  Help from another person taking care of personal grooming? 3  Help from another person toileting, which includes using toliet, bedpan, or urinal? 2  Help from another person bathing (including washing, rinsing, drying)? 2  Help from another person to put on and taking off regular upper body clothing? 3  Help from another person to put on and taking off regular lower body clothing? 1  6 Click Score 15  Progressive Mobility  What is the highest level of mobility based on the progressive mobility assessment? Level 5 (Walks with assist in room/hall) - Balance while stepping forward/back and can walk in room with assist - Complete  Activity Ambulated with assistance in hallway;Transferred from bed to chair  OT Recommendation  Follow Up Recommendations Home health OT (if family can arrange 24/7 sup)  Assistance recommended at discharge Frequent or constant Supervision/Assistance  Patient can return home with the following A little help with walking and/or transfers;A lot of help with bathing/dressing/bathroom;Direct supervision/assist for medications management;Direct supervision/assist for financial management;Assist for transportation;Help with stairs or ramp for entrance  Functional Status Assessent Patient has had a recent decline in their functional status and demonstrates the ability to make significant improvements in function in a reasonable and predictable amount of time.  OT Equipment None recommended by OT  Individuals Consulted  Consulted and Agree with Results and Recommendations Family member/caregiver;Patient  Family Member Consulted son  Acute Rehab OT Goals  Patient Stated Goal Home with increased support/caregiver assist  OT Goal Formulation With patient/family  Time For Goal Achievement 03/21/22  Potential to Achieve Goals Good  OT Time Calculation  OT Start Time (ACUTE ONLY)  0844  OT Stop Time (ACUTE ONLY) 0911  OT Time Calculation (min) 27 min  OT General Charges  $OT Visit 1 Visit  OT Evaluation  $OT Eval Low Complexity 1 Low  Written Expression  Dominant Hand  (did not specify)   Delbert Phenix OT OT pager: [phone]

## 2022-03-07 NOTE — Plan of Care (Signed)

## 2022-03-08 LAB — GLUCOSE, CAPILLARY
Glucose-Capillary: 148 mg/dL — ABNORMAL HIGH (ref 70–99)
Glucose-Capillary: 138 mg/dL — ABNORMAL HIGH (ref 70–99)

## 2022-03-08 LAB — URINE CULTURE: Culture: >=100000 — AB

## 2022-03-08 MED ORDER — CEFADROXIL 500 MG PO CAPS
1000.0000 mg | ORAL_CAPSULE | Freq: Two times a day (BID) | ORAL | Status: DC
  Administered 2022-03-08: 1000 mg via ORAL
  Filled 2022-03-08: qty 2

## 2022-03-08 MED ORDER — CEFADROXIL 500 MG PO CAPS: 1000.0000 mg | ORAL_CAPSULE | Freq: Two times a day (BID) | ORAL | 0 refills | Status: AC

## 2022-03-08 NOTE — Discharge Summary (Addendum)
Discharge Summary  Harumi Yamin HUT:[id number] DOB: 04-03-1946  PCP: Karleen Hampshire., MD  Admit date: 03/05/2022 Discharge date: 03/08/2022  Time spent: 25 minutes.  Recommendations for Outpatient Follow-up:  Follow-up with neuro-oncology in 1 to 2 weeks Follow-up with your primary care provider in 1 to 2 weeks. Take your medications as prescribed. Continue PT OT with assistance and fall precautions.  Discharge Diagnoses:  Active Hospital Problems   Diagnosis Date Noted   UTI (urinary tract infection) 03/05/2022    Priority: 1.   Acute encephalopathy 03/05/2022    Priority: 2.   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   Hypertension     Priority: 4.   Prediabetes     Priority: 5.    Resolved Hospital Problems  No resolved problems to display.    Discharge Condition: Stable  Diet recommendation: Resume previous diet.  Vitals:   03/08/22 0517 03/08/22 1238  BP: (!) 137/54 (!) 139/106  Pulse: (!) 50 74  Resp: 16 14  Temp: 98 F (36.7 C) (!) 97.4 F (36.3 C)  SpO2: 94% 97%    History of present illness:   Kimiye Strathman is a 76 y.o. female with medical history significant of hypertension, prediabetes, left frontal CNS lymphoma who presents with concern for confusion, painful urination. work-up revealed E. coli/Streptococcus viridans UTI, pansensitive.  She received 3 days of Rocephin.  Switched to cefadroxil at discharge.  03/08/2022: Patient was seen at her bedside.  There were no acute events overnight.  Her mentation is at baseline.  She denies having any headaches, no nausea.  No new complaints.  Discussed with Dr. Mickeal Skinner on 03/07/2022 okay to discharge from a neuro-oncology standpoint.  Hospital Course:  Principal Problem:   UTI (urinary tract infection) Active Problems:   Acute encephalopathy   CNS lymphoma ([facility])   Hypertension   Prediabetes  E. coli, viridans Streptococcus UTI (urinary tract infection), POA Received 3 days of Rocephin.  Switched to  cefadroxil at discharge. Urine culture grew greater than 100,000 colonies of E. coli.  20,000 colonies of viridans Streptococcus.  Pansensitive. Blood cultures x2 negative to date Follow-up with your primary care provider.   Resolved acute encephalopathy likely secondary to presumptive UTI Secondary to UTI and increase in CNS lymphoma size.  Oncology consulted by ER and will see in am.  Continue IV decadron per oncology recommendation   CNS lymphoma [facility]) Discussed with Dr. Mickeal Skinner via secure chat on 03/06/2022, will follow-up in the office.   Hypertension Continue avapro.  Follow-up with your primary care provider.   Prediabetes with hyperglycemia likely exacerbated by IV steroids Hemoglobin A1c 6.0 on 03/05/2022. Avoid hypoglycemia and follow-up with your primary care provider.   Physical debility PT OT recommended home health PT OT Continue PT OT at home with assistance and fall precautions.     Discharge Exam: BP (!) 139/106 (BP Location: Right Arm)    Pulse 74    Temp (!) 97.4 F (36.3 C) (Oral)    Resp 14    Ht 5' 7.5" (1.715 m)    Wt 88 kg    SpO2 97%    BMI 29.93 kg/m  General: 76 y.o. year-old female well developed well nourished in no acute distress.  Alert and oriented x3. Cardiovascular: Regular rate and rhythm with no rubs or gallops.  No thyromegaly or JVD noted.   Respiratory: Clear to auscultation with no wheezes or rales. Good inspiratory effort. Abdomen: Soft nontender nondistended with normal bowel sounds x4  quadrants. Psychiatry: Mood is appropriate for condition and setting  Discharge Instructions You were cared for by a hospitalist during your hospital stay. If you have any questions about your discharge medications or the care you received while you were in the hospital after you are discharged, you can call the unit and asked to speak with the hospitalist on call if the hospitalist that took care of you is not available. Once you are discharged, your primary  care physician will handle any further medical issues. Please note that NO REFILLS for any discharge medications will be authorized once you are discharged, as it is imperative that you return to your primary care physician (or establish a relationship with a primary care physician if you do not have one) for your aftercare needs so that they can reassess your need for medications and monitor your lab values.   Allergies as of 03/08/2022       Reactions   Codeine Other (See Comments)   Severe GI upset   Lisinopril Cough      Hydrocodone-acetaminophen Nausea Only        Medication List     STOP taking these medications    LORazepam 0.5 MG tablet Commonly known as: Ativan       TAKE these medications    acetaminophen 500 MG tablet Commonly known as: TYLENOL Take 500-1,000 mg by mouth 2 (two) times daily as needed for mild pain.   alendronate 70 MG tablet Commonly known as: FOSAMAX Take 70 mg by mouth every Friday.   cefadroxil 500 MG capsule Commonly known as: DURICEF Take 2 capsules (1,000 mg total) by mouth 2 (two) times daily for 7 days.   CRANBERRY PO Take 1 tablet by mouth daily.   dexamethasone 4 MG tablet Commonly known as: DECADRON Take 1 tablet (4 mg total) by mouth 2 (two) times daily with a meal. What changed:  when to take this additional instructions   gabapentin 300 MG capsule Commonly known as: NEURONTIN Take 900 mg by mouth See admin instructions. '900mg'$  in the afternoon and at bedtime   irbesartan 150 MG tablet Commonly known as: AVAPRO Take 150 mg by mouth at bedtime.   Systane Ultra PF 0.4-0.3 % Soln Generic drug: Polyethyl Glyc-Propyl Glyc PF Place 1 drop into both eyes 3 (three) times daily as needed (for dryness).   Vitamin D (Ergocalciferol) 1.25 MG (50000 UNIT) Caps capsule Commonly known as: DRISDOL Take 50,000 Units by mouth every Wednesday.       Allergies  Allergen Reactions   Codeine Other (See Comments)    Severe GI  upset    Lisinopril Cough        Hydrocodone-Acetaminophen Nausea Only    Follow-up Information     Karleen Hampshire., MD. Call today.   Specialty: Internal Medicine Why: Please call for a posthospital follow-up appointment. Contact information: [address] [place] [address] [phone]         Ventura Sellers, MD. Call today.   Specialties: Psychiatry, Neurology, Oncology Why: Please call for a posthospital follow-up appointment. Contact information: [place] [address] [phone]         Care, [facility] Follow up.   Specialty: Home Health Services Why: Alvis Lemmings wil follow at home for Home Health Physical Therapy and Home Health needs. Please call Alvis Lemmings if you have any questions. Contact information: [place] [address] [phone]  The results of significant diagnostics from this hospitalization (including imaging, microbiology, ancillary and laboratory) are listed below for reference.    Significant Diagnostic Studies: CT Head Wo Contrast  Result Date: 03/05/2022 CLINICAL DATA:  Altered mental status EXAM: CT HEAD WITHOUT CONTRAST TECHNIQUE: Contiguous axial images were obtained from the base of the skull through the vertex without intravenous contrast. RADIATION DOSE REDUCTION: This exam was performed according to the departmental dose-optimization program which includes automated exposure control, adjustment of the mA and/or kV according to patient size and/or use of iterative reconstruction technique. COMPARISON:  02/14/2022 MRI, correlation is also made with CT head 01/02/2022 FINDINGS: Brain: Likely further interval increase in the size of the mass spanning the bilateral frontal lobes, which measures approximately 5.3 x 5.4 x 4.8 cm (series 3, image 17 and series 4, image 24), previously 3.2 x 4.1 x 4.1 cm when remeasured similarly on the 02/14/2022 MRI,  although comparison is limited by the absence of intravenous contrast on the current study and differences in modality. Increased surrounding hypodensity, likely edema. Increased mass effect on the frontal horns of the lateral ventricles. Additional previously noted lesion in the right basal ganglia has also likely increased in size (series 3, image 17) No definite acute infarct, hemorrhage, hydrocephalus, or extra-axial collection. Vascular: No hyperdense vessel. Skull: Right occipital craniotomy. Left frontal burr hole. Hyperostosis frontalis. No acute osseous abnormality. Sinuses/Orbits: No acute finding. Status post bilateral lens replacements. Other: The mastoids are well aerated. IMPRESSION: 1. Suspect significant increase in the size of the mass spanning the bilateral frontal lobes, when accounting for the absence of intravenous contrast and differences in technique between the current study and the prior MRI. The right basal ganglia lesion has likely also increased in size. Consider MRI with and without contrast for better comparison. 2. No other acute intracranial process. Electronically Signed   By: Merilyn Baba M.D.   On: 03/05/2022 19:35   MR Brain W and Wo Contrast  Result Date: 03/05/2022 CLINICAL DATA:  Follow-up examination for CNS neoplasm. EXAM: MRI HEAD WITHOUT AND WITH CONTRAST TECHNIQUE: Multiplanar, multiecho pulse sequences of the brain and surrounding structures were obtained without and with intravenous contrast. CONTRAST:  74m GADAVIST GADOBUTROL 1 MMOL/ML IV SOLN COMPARISON:  Prior CT from earlier the same day as well as previous MRI from 02/14/2022. FINDINGS: Brain: There has been interval progression and worsening of disease with increased size of the bifrontal mass as compared to most recent MRI from 02/14/2022. The site of the initial lesion at the anterior inferior left frontal lobe has increased in size, now measuring 4.9 x 3.4 cm in this region, previously 3.5 x 2.5 cm when  measured in a similar fashion. Bulky tumor extends superiorly, and crosses the midline across the anterior genu of the corpus callosum, with involvement of the contralateral right frontal lobe. This as all increased in size from previous. Superior extension into the left greater than right frontal lobes superiorly has also progressed (series 20, image 89). Overall, size of the primary lesion has more than doubled in size from prior. Associated enhancement is more pronounced and increased from prior. Additionally, associated susceptibility artifact consistent with blood products is also increased from prior. Progressive mass effect on the anterior aspect of the lateral ventricles anteriorly. No hydrocephalus or trapping. Associated vasogenic edema within the anterior inferior left greater than right frontal lobes has also progressed. Localized left-to-right shift of up to approximately 7 mm noted along the anterior/inferior falx. An additional site of lymphomatous  deposit again seen within the right basal ganglia, measuring 1.5 x 1.6 cm on today's exam (series 20, image 62), increased in size and now demonstrating more solid and prominent enhancement. Additional involvement of the contralateral anterior left basal ganglia is also more prominent and progressed (series 20, image 79). Associated edema extending into the left cerebral peduncle again noted. 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 No other evidence for acute or subacute infarct. No other mass lesion or abnormal enhancement. No extra-axial collections. Probable irregular postoperative scarring and enhancement noted at the right CP angle cistern. Vascular: Major intracranial vascular flow voids are maintained. Skull and upper cervical spine: Craniocervical junction within normal limits. Bone marrow signal intensity normal. No focal marrow replacing lesion. Remote right occipital craniotomy. Hyperostosis  frontalis interna noted. No scalp soft tissue abnormality. Sinuses/Orbits: Prior bilateral ocular lens replacement. Globes orbital soft tissues demonstrate no acute finding. Paranasal sinuses are largely clear. Trace left mastoid effusion noted, of doubtful significance. Other: None. IMPRESSION: 1. Interval progression of disease as compared to most recent MRI from 02/14/2022 as evidence by increased size and enhancement of the bifrontal mass. Associated vasogenic edema within the anterior-inferior left greater than right frontal lobes has also progressed and worsened, with localized 7 mm left-to-right shift now seen along the anterior/inferior falx. Progressive mass effect on the anterior lateral ventricles without hydrocephalus. 2. 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 3. 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 Electronically Signed   By: Jeannine Boga M.D.   On: 03/05/2022 22:50   MR BRAIN W WO CONTRAST  Result Date: 02/15/2022 CLINICAL DATA:  brain/CNS neoplasm. Assess treatment response. History of CNS lymphoma. EXAM: MRI HEAD WITHOUT AND WITH CONTRAST TECHNIQUE: Multiplanar, multiecho pulse sequences of the brain and surrounding structures were obtained without and with intravenous contrast. CONTRAST:  64m GADAVIST GADOBUTROL 1 MMOL/ML IV SOLN COMPARISON:  PET scan 02/12/2022. Head CT 01/02/2022. MRI 01/01/2022. FINDINGS: Brain: There has been considerable progression of disease since the study 01/01/2022. The epicenter of the initial lesion in the left inferior frontal white matter previously measured approximately 14 x 18 mm as far as the abnormal enhancement, but now measures 25 x 35 mm in that region. Similar increase in tumor mass with respect to the disease affecting the genu of the corpus callosum and crossing elsewhere into the  frontal white matter. The overall volume of tumor is estimated to have doubled. There is new lymphoma visible in the right basal ganglia and inferior internal capsule with the region of enhancement measuring approximally 9 x 12 mm. Disease is probably crossing by the anterior commissure and manifesting as edema at the inferior basal ganglia and internal capsule on the left, with edema extending into the cerebral peduncle. There is not abnormal enhancement occurring in this region presently. Widespread chronic small-vessel ischemic changes elsewhere throughout the brain are unchanged. Minor post biopsy changes in the left frontal lobe show expected evolutionary change without worsening. Distant right occipital craniotomy without residual or unexpected findings in that region. No hydrocephalus. No extra-axial collection. Vascular: Major vessels at the base of the brain show flow. Skull and upper cervical spine: Otherwise negative Sinuses/Orbits: Clear/normal Other: None IMPRESSION: Compared to the study of 01/01/2022, there is marked worsening of disease. Lymphoma tumor volume in the frontal lobes left more than right with crossing via the genu of the corpus callosum  has approximally doubled in volume. There is a new manifestation of lymphoma involvement at the inferior basal ganglia/internal capsule on the right, probably crossing by the anterior commissure to the base of the brain on the left, where this is manifest more by increasing edema pattern with relatively subtle low level enhancement, but with restricted diffusion. Electronically Signed   By: Nelson Chimes M.D.   On: 02/15/2022 16:07   NM PET Image Initial (PI) Skull Base To Thigh (F-18 FDG)  Result Date: 02/13/2022 CLINICAL DATA:  Initial treatment strategy for CNS lymphoma. EXAM: NUCLEAR MEDICINE PET SKULL BASE TO THIGH TECHNIQUE: 9.7 mCi F-18 FDG was injected intravenously. Full-ring PET imaging was performed from the skull base to thigh after the  radiotracer. CT data was obtained and used for attenuation correction and anatomic localization. Fasting blood glucose: 102 mg/dl COMPARISON:  Brain MRI 01/01/2022 FINDINGS: Mediastinal blood pool activity: SUV max 2.93 Liver activity: SUV max NA NECK: Intense metabolic activity within the LEFT frontal lobe which crosses the corpus callosum consistent with lymphoma. No hypermetabolic adenopathy in the neck. Incidental CT findings: none CHEST: No hypermetabolic mediastinal or hilar nodes. No suspicious pulmonary nodules on the CT scan. Incidental CT findings: none ABDOMEN/PELVIS: No abnormal hypermetabolic activity within the liver, pancreas, adrenal glands, or spleen. No hypermetabolic lymph nodes in the abdomen or pelvis. Incidental CT findings: none SKELETON: No focal hypermetabolic activity to suggest skeletal metastasis. Incidental CT findings: none IMPRESSION: 1. Hypermetabolic lesion in the LEFT frontal lobe crossing midline consistent with CNS lymphoma. 2. No evidence of metastatic lymphoma within neck, chest, abdomen or pelvis. Electronically Signed   By: Suzy Bouchard M.D.   On: 02/13/2022 16:18   DG Chest Portable 1 View  Result Date: 03/05/2022 CLINICAL DATA:  Altered mental status. EXAM: PORTABLE CHEST 1 VIEW COMPARISON:  December 30, 2021 FINDINGS: A right-sided venous Port-A-Cath is seen with its distal tip noted at the junction of the superior vena cava and right atrium. The heart size and mediastinal contours are within normal limits. Both lungs are clear. The visualized skeletal structures are unremarkable. IMPRESSION: 1. Right-sided venous Port-A-Cath positioning, as described above. 2. No acute cardiopulmonary disease. Electronically Signed   By: Virgina Norfolk M.D.   On: 03/05/2022 19:31   MR TOTAL SPINE METS SCREENING  Result Date: 02/15/2022 CLINICAL DATA:  76 year old female with history of CNS lymphoma, evaluate for spinal metastatic disease. EXAM: MRI TOTAL SPINE WITHOUT AND WITH  CONTRAST TECHNIQUE: Multisequence MR imaging of the spine from the cervical spine to the sacrum was performed prior to and following IV contrast administration for evaluation of spinal metastatic disease. CONTRAST:  Please see contrast documentation. COMPARISON:  Comparison made with previous brain MRI from 01/01/2022. FINDINGS: MRI CERVICAL SPINE FINDINGS Alignment: Physiologic with preservation of the normal cervical lordosis. No listhesis. Vertebrae: Vertebral body height maintained without acute or chronic fracture. Bone marrow signal intensity within normal limits. No discrete or worrisome osseous lesions. No abnormal marrow edema or enhancement. Cord: Normal signal and morphology. No evidence for metastatic disease within the cervical spine. No abnormal enhancement. Posterior Fossa, vertebral arteries, paraspinal tissues: Paraspinous soft tissues within normal limits. 1.2 cm right thyroid nodule noted, of doubtful significance given size and patient age, no follow-up imaging recommended (ref: J Am Coll Radiol. 2015 Feb;12(2): 143-50). Disc levels: Mild for age degenerative spondylosis present at C3-4 through C6-7. No high-grade spinal stenosis or cord impingement. Foramina remain largely patent. MRI THORACIC SPINE FINDINGS Alignment: Exaggeration of the normal thoracic kyphosis.  No listhesis. Vertebrae: Vertebral body height maintained without acute or chronic fracture. Bone marrow signal intensity within normal limits. No worrisome osseous lesions. No abnormal marrow edema or enhancement. Cord: Normal signal and morphology. No evidence for metastatic disease. No abnormal enhancement. Paraspinal and other soft tissues: Unremarkable. Disc levels: Central disc protrusion noted at T12-L1 without significant stenosis. Otherwise, no other significant spondylosis for age. No canal or neural foraminal stenosis. MRI LUMBAR SPINE FINDINGS Segmentation: Standard. Lowest well-formed disc space labeled the L5-S1 level.  Alignment: Physiologic with preservation of the normal lumbar lordosis. No listhesis. Vertebrae: Vertebral body height maintained without acute or chronic fracture. Bone marrow signal intensity within normal limits. No discrete or worrisome osseous lesions. No abnormal marrow edema or enhancement. Conus medullaris: Extends to the L1 level and appears normal. No abnormal enhancement seen about the conus medullaris or nerve roots of the cauda equina to suggest drop metastases. Paraspinal and other soft tissues: Visualized paraspinous soft tissues within normal limits. Disc levels: Shallow posterior disc bulge noted at L5-S1 without significant spinal stenosis. Lower lumbar facet hypertrophy noted. IMPRESSION: 1. No MRI evidence for metastatic disease within the cervical, thoracic, or lumbar spine. 2. Mild for age degenerative spondylosis without significant stenosis or overt neural impingement. Electronically Signed   By: Jeannine Boga M.D.   On: 02/15/2022 03:46   IR IMAGING GUIDED PORT INSERTION  Result Date: 03/04/2022 INDICATION: Chemotherapy EXAM: Ultrasound-guided puncture of the right internal jugular vein Placement of a right-sided chest port using fluoroscopic guidance MEDICATIONS: None ANESTHESIA/SEDATION: Moderate (conscious) sedation was employed during this procedure. A total of Versed 1 mg and Fentanyl 50 mcg was administered intravenously. Moderate Sedation Time: 30 minutes. The patient's level of consciousness and vital signs were monitored continuously by radiology nursing throughout the procedure under my direct supervision. FLUOROSCOPY TIME:  Fluoroscopy Time: 0.3 minutes (2 mGy) COMPLICATIONS: None immediate. PROCEDURE: Informed written consent was obtained from the patient after a thorough discussion of the procedural risks, benefits and alternatives. All questions were addressed. Maximal Sterile Barrier Technique was utilized including caps, mask, sterile gowns, sterile gloves, sterile  drape, hand hygiene and skin antiseptic. A timeout was performed prior to the initiation of the procedure. The patient was placed supine on the exam table. The right neck and chest was prepped and draped in the standard sterile fashion. A preliminary ultrasound of the right neck was performed and demonstrates a patent right internal jugular vein. A permanent ultrasound image was stored in the electronic medical record. The overlying skin was anesthetized with 1% Lidocaine. Using ultrasound guidance, access was obtained into the right internal jugular vein using a 21 gauge micropuncture set. A wire was advanced into the SVC, a short incision was made at the puncture site, and serial dilatation performed. Next, in an ipsilateral infraclavicular location, an incision was made at the site of the subcutaneous reservoir. Blunt dissection was used to open a pocket to contain the reservoir. A subcutaneous tunnel was then created from the port site to the puncture site. A(n) 8 Fr single lumen catheter was advanced through the tunnel. The catheter was attached to the port and this was placed in the subcutaneous pocket. Under fluoroscopic guidance, a peel away sheath was placed, and the catheter was trimmed to the appropriate length and was advanced into the central veins. The catheter length is 23 cm. The tip of the catheter lies near the superior cavoatrial junction. The port flushes and aspirates appropriately. The port was flushed and locked with heparinized saline.  The port pocket was closed in 2 layers using 3-0 and 4-0 Vicryl/absorbable suture. Dermabond was also applied to both incisions. The patient tolerated the procedure well and was transferred to recovery in stable condition. IMPRESSION: Successful placement of a right-sided chest port via the right internal jugular vein. The port is ready for immediate use. Electronically Signed   By: Albin Felling M.D.   On: 03/04/2022 13:52    Microbiology: Recent Results  (from the past 240 hour(s))  Urine Culture     Status: Abnormal   Collection Time: 03/05/22  6:36 PM   Specimen: Urine, Clean Catch  Result Value Ref Range Status   Specimen Description   Final    URINE, CLEAN CATCH Performed at [facility], [place] [address], [place], [place] [address]    Special Requests   Final    NONE Performed at [facility], [place] [address] [place], [place] [address]    Culture (A)  Final    >=100,000 COLONIES/mL ESCHERICHIA COLI 20,000 COLONIES/mL VIRIDANS STREPTOCOCCUS Standardized susceptibility testing for this organism is not available. Performed at [facility], [place] [address], [place],  [address]    Report Status 03/08/2022 FINAL  Final   Organism ID, Bacteria ESCHERICHIA COLI (A)  Final      Susceptibility   Escherichia coli - MIC*    AMPICILLIN 4 SENSITIVE Sensitive     CEFAZOLIN <=4 SENSITIVE Sensitive     CEFEPIME <=0.12 SENSITIVE Sensitive     CEFTRIAXONE <=0.25 SENSITIVE Sensitive     CIPROFLOXACIN <=0.25 SENSITIVE Sensitive     GENTAMICIN <=1 SENSITIVE Sensitive     IMIPENEM <=0.25 SENSITIVE Sensitive     NITROFURANTOIN <=16 SENSITIVE Sensitive     TRIMETH/SULFA <=20 SENSITIVE Sensitive     AMPICILLIN/SULBACTAM <=2 SENSITIVE Sensitive     PIP/TAZO <=4 SENSITIVE Sensitive     * >=100,000 COLONIES/mL ESCHERICHIA COLI  Resp Panel by RT-PCR (Flu A&B, Covid) Nasopharyngeal Swab     Status: None   Collection Time: 03/05/22  6:37 PM   Specimen: Nasopharyngeal Swab; Nasopharyngeal(NP) swabs in vial transport medium  Result Value Ref Range Status   SARS Coronavirus 2 by RT PCR NEGATIVE NEGATIVE Final    Comment: (NOTE) SARS-CoV-2 target nucleic acids are NOT DETECTED.  The SARS-CoV-2 RNA is generally detectable in upper respiratory specimens during the acute phase of infection. The lowest concentration of SARS-CoV-2 viral copies this assay can detect is 138 copies/mL. A negative result does not  preclude SARS-Cov-2 infection and should not be used as the sole basis for treatment or other patient management decisions. A negative result may occur with  improper specimen collection/handling, submission of specimen other than nasopharyngeal swab, presence of viral mutation(s) within the areas targeted by this assay, and inadequate number of viral copies(<138 copies/mL). A negative result must be combined with clinical observations, patient history, and epidemiological information. The expected result is Negative.  Fact Sheet for Patients:  EntrepreneurPulse.com.au  Fact Sheet for Healthcare Providers:  IncredibleEmployment.be  This test is no t yet approved or cleared by the [place] FDA and  has been authorized for detection and/or diagnosis of SARS-CoV-2 by FDA under an Emergency Use Authorization (EUA). This EUA will remain  in effect (meaning this test can be used) for the duration of the COVID-19 declaration under Section 564(b)(1) of the Act, 21 U.S.C.section 360bbb-3(b)(1), unless the authorization is terminated  or revoked sooner.       Influenza A by PCR  NEGATIVE NEGATIVE Final   Influenza B by PCR NEGATIVE NEGATIVE Final    Comment: (NOTE) The Xpert Xpress SARS-CoV-2/FLU/RSV plus assay is intended as an aid in the diagnosis of influenza from Nasopharyngeal swab specimens and should not be used as a sole basis for treatment. Nasal washings and aspirates are unacceptable for Xpert Xpress SARS-CoV-2/FLU/RSV testing.  Fact Sheet for Patients: EntrepreneurPulse.com.au  Fact Sheet for Healthcare Providers: IncredibleEmployment.be  This test is not yet approved or cleared by the [place] FDA and has been authorized for detection and/or diagnosis of SARS-CoV-2 by FDA under an Emergency Use Authorization (EUA). This EUA will remain in effect (meaning this test can be used) for the  duration of the COVID-19 declaration under Section 564(b)(1) of the Act, 21 U.S.C. section 360bbb-3(b)(1), unless the authorization is terminated or revoked.  Performed at [facility], [place] [address], [place], [place] [address]   Blood culture (routine x 2)     Status: None (Preliminary result)   Collection Time: 03/05/22  7:00 PM   Specimen: BLOOD  Result Value Ref Range Status   Specimen Description   Final    BLOOD RIGHT ANTECUBITAL Performed at [place] [address] [place], [place] [address]    Special Requests   Final    BOTTLES DRAWN AEROBIC AND ANAEROBIC Blood Culture results may not be optimal due to an inadequate volume of blood received in culture bottles Performed at [place] [address] [place], [place] [address]    Culture   Final    NO GROWTH 3 DAYS Performed at [facility], [place] [address] [place], [place] [address]    Report Status PENDING  Incomplete  Blood culture (routine x 2)     Status: None (Preliminary result)   Collection Time: 03/05/22  7:01 PM   Specimen: BLOOD  Result Value Ref Range Status   Specimen Description   Final    BLOOD LEFT ANTECUBITAL Performed at [place] [address], [place], [place] [address]    Special Requests   Final    BOTTLES DRAWN AEROBIC AND ANAEROBIC Blood Culture adequate volume Performed at [place] [address], [place], [place] [address]    Culture   Final    NO GROWTH 3 DAYS Performed at [facility], [place] [address], [place],  [address]    Report Status PENDING  Incomplete     Labs: Basic Metabolic Panel: Recent Labs  Lab 03/05/22 1835 03/06/22 0421  NA 141 139  K 4.1 4.2  CL 104 105  CO2 29 29  GLUCOSE 189* 162*  BUN 24* 21  CREATININE 0.75 0.55  CALCIUM 8.9 8.6*   Liver Function Tests: Recent Labs  Lab 03/05/22 1835  AST 17  ALT 23  ALKPHOS 58   BILITOT 0.7  PROT 6.4*  ALBUMIN 3.7   No results for input(s): LIPASE, AMYLASE in the last 168 hours. No results for input(s): AMMONIA in the last 168 hours. CBC: Recent Labs  Lab 03/05/22 1835 03/06/22 0421  WBC 7.6 7.4  NEUTROABS 6.1  --   HGB 15.2* 13.5  HCT 48.5* 43.7  MCV 91.2 91.4  PLT 199 191   Cardiac Enzymes: No results for input(s): CKTOTAL, CKMB, CKMBINDEX, TROPONINI in the last 168 hours. BNP: BNP (last 3 results) No results for input(s): BNP in the last 8760 hours.  ProBNP (last 3 results) No results for input(s): PROBNP  in the last 8760 hours.  CBG: Recent Labs  Lab 03/07/22 1147 03/07/22 1635 03/07/22 2114 03/08/22 0725 03/08/22 1136  GLUCAP 156* 178* 201* 148* 138*       Signed:  Kayleen Memos, MD [facility] 03/08/2022, 4:47 PM

## 2022-03-08 NOTE — Progress Notes (Signed)
Assessment unchanged. Pt and son verbalized understanding of dc instructions through teach back including medications. Discharged via wc to front entrance accompanied by NT and son. ?

## 2022-03-08 NOTE — TOC Progression Note (Signed)
Transition of Care (TOC) - Progression Note  ? ? ?Patient Details  ?Name: Isabel Gibbs ?MRN: [id number] ?Date of Birth: 1946-08-15 ? ?Transition of Care (TOC) CM/SW Contact  ?Purcell Mouton, RN ?Phone Number: ?03/08/2022, 11:26 AM ? ?Clinical Narrative:    ? ?Spoke with pt and son [name] at bedside. [facility] needs. Referral given to in house rep with [facility].  ? ?  ?  ? ?Expected Discharge Plan and Services ?  ?  ?  ?  ?  ?Expected Discharge Date: 03/08/22               ?  ?  ?  ?  ?  ?  ?  ?  ?  ?  ? ? ?Social Determinants of Health (SDOH) Interventions ?  ? ?Readmission Risk Interventions ?No flowsheet data found. ? ?

## 2022-03-10 ENCOUNTER — Inpatient Hospital Stay (HOSPITAL_COMMUNITY)
Admission: RE | Admit: 2022-03-10 | Discharge: 2022-03-15 | DRG: 846 | Disposition: A | Discharge status: Home Health | Payer: Medicare Other | Attending: Internal Medicine | Admitting: Internal Medicine

## 2022-03-10 ENCOUNTER — Encounter (HOSPITAL_COMMUNITY): Payer: Self-pay | Admitting: Internal Medicine

## 2022-03-10 ENCOUNTER — Other Ambulatory Visit: Payer: Self-pay

## 2022-03-10 DIAGNOSIS — G936 Cerebral edema: Secondary | ICD-10-CM | POA: Diagnosis present

## 2022-03-10 DIAGNOSIS — Z888 Allergy status to other drugs, medicaments and biological substances status: Secondary | ICD-10-CM

## 2022-03-10 DIAGNOSIS — Z87891 Personal history of nicotine dependence: Secondary | ICD-10-CM

## 2022-03-10 DIAGNOSIS — Z8249 Family history of ischemic heart disease and other diseases of the circulatory system: Secondary | ICD-10-CM | POA: Diagnosis not present

## 2022-03-10 DIAGNOSIS — Z885 Allergy status to narcotic agent status: Secondary | ICD-10-CM

## 2022-03-10 DIAGNOSIS — Z833 Family history of diabetes mellitus: Secondary | ICD-10-CM

## 2022-03-10 DIAGNOSIS — N342 Other urethritis: Secondary | ICD-10-CM | POA: Diagnosis present

## 2022-03-10 DIAGNOSIS — Z95828 Presence of other vascular implants and grafts: Secondary | ICD-10-CM | POA: Diagnosis not present

## 2022-03-10 DIAGNOSIS — C8589 Other specified types of non-Hodgkin lymphoma, extranodal and solid organ sites: Secondary | ICD-10-CM | POA: Diagnosis not present

## 2022-03-10 DIAGNOSIS — C8339 Diffuse large B-cell lymphoma, extranodal and solid organ sites: Secondary | ICD-10-CM | POA: Diagnosis present

## 2022-03-10 DIAGNOSIS — I10 Essential (primary) hypertension: Secondary | ICD-10-CM | POA: Diagnosis present

## 2022-03-10 DIAGNOSIS — Z5111 Encounter for antineoplastic chemotherapy: Principal | ICD-10-CM

## 2022-03-10 DIAGNOSIS — G8191 Hemiplegia, unspecified affecting right dominant side: Secondary | ICD-10-CM | POA: Diagnosis present

## 2022-03-10 DIAGNOSIS — R7303 Prediabetes: Secondary | ICD-10-CM | POA: Diagnosis present

## 2022-03-10 DIAGNOSIS — G500 Trigeminal neuralgia: Secondary | ICD-10-CM | POA: Diagnosis present

## 2022-03-10 DIAGNOSIS — E876 Hypokalemia: Secondary | ICD-10-CM | POA: Diagnosis present

## 2022-03-10 LAB — CULTURE, BLOOD (ROUTINE X 2)
Culture: NO GROWTH
Culture: NO GROWTH
Special Requests: ADEQUATE

## 2022-03-10 LAB — COMPREHENSIVE METABOLIC PANEL
ALT: 38 U/L (ref 0–44)
AST: 17 U/L (ref 15–41)
Albumin: 3.1 g/dL — ABNORMAL LOW (ref 3.5–5.0)
Alkaline Phosphatase: 44 U/L (ref 38–126)
Anion gap: 7 (ref 5–15)
BUN: 33 mg/dL — ABNORMAL HIGH (ref 8–23)
CO2: 29 mmol/L (ref 22–32)
Calcium: 8.5 mg/dL — ABNORMAL LOW (ref 8.9–10.3)
Chloride: 106 mmol/L (ref 98–111)
Creatinine, Ser: 0.63 mg/dL (ref 0.44–1.00)
GFR, Estimated: >60 mL/min (ref >60)
Glucose, Bld: 127 mg/dL — ABNORMAL HIGH (ref 70–99)
Potassium: 4.2 mmol/L (ref 3.5–5.1)
Sodium: 142 mmol/L (ref 135–145)
Total Bilirubin: 0.5 mg/dL (ref 0.3–1.2)
Total Protein: 5.5 g/dL — ABNORMAL LOW (ref 6.5–8.1)

## 2022-03-10 LAB — CBC WITH DIFFERENTIAL/PLATELET
Abs Immature Granulocytes: 0.36 10*3/uL — ABNORMAL HIGH (ref 0.00–0.07)
Basophils Absolute: 0.1 10*3/uL (ref 0.0–0.1)
Basophils Relative: 0 %
Eosinophils Absolute: 0 10*3/uL (ref 0.0–0.5)
Eosinophils Relative: 0 %
HCT: 44.3 % (ref 36.0–46.0)
Hemoglobin: 14.5 g/dL (ref 12.0–15.0)
Immature Granulocytes: 3 %
Lymphocytes Relative: 13 %
Lymphs Abs: 1.5 10*3/uL (ref 0.7–4.0)
MCH: 29.3 pg (ref 26.0–34.0)
MCHC: 32.7 g/dL (ref 30.0–36.0)
MCV: 89.5 fL (ref 80.0–100.0)
Monocytes Absolute: 0.6 10*3/uL (ref 0.1–1.0)
Monocytes Relative: 5 %
Neutro Abs: 9.2 10*3/uL — ABNORMAL HIGH (ref 1.7–7.7)
Neutrophils Relative %: 79 %
Platelets: 191 10*3/uL (ref 150–400)
RBC: 4.95 MIL/uL (ref 3.87–5.11)
RDW: 14.4 % (ref 11.5–15.5)
WBC: 11.7 10*3/uL — ABNORMAL HIGH (ref 4.0–10.5)
nRBC: 0 % (ref 0.0–0.2)

## 2022-03-10 LAB — URINALYSIS, ROUTINE W REFLEX MICROSCOPIC
Bilirubin Urine: NEGATIVE
Glucose, UA: 50 mg/dL — AB
Ketones, ur: NEGATIVE mg/dL
Leukocytes,Ua: NEGATIVE
Nitrite: NEGATIVE
Protein, ur: NEGATIVE mg/dL
Specific Gravity, Urine: 1.013 (ref 1.005–1.030)
pH: 5 (ref 5.0–8.0)

## 2022-03-10 LAB — HEPATITIS PANEL, ACUTE
HCV Ab: NONREACTIVE
Hep A IgM: NONREACTIVE
Hep B C IgM: NONREACTIVE
Hepatitis B Surface Ag: NONREACTIVE

## 2022-03-10 MED ORDER — SENNOSIDES-DOCUSATE SODIUM 8.6-50 MG PO TABS: 1.0000 | ORAL_TABLET | Freq: Every evening | ORAL | Status: DC | PRN

## 2022-03-10 MED ORDER — ONDANSETRON HCL 4 MG PO TABS: 4.0000 mg | ORAL_TABLET | Freq: Three times a day (TID) | ORAL | Status: DC | PRN

## 2022-03-10 MED ORDER — DEXTROSE-NACL 5-0.45 % IV SOLN: INTRAVENOUS | Status: DC

## 2022-03-10 MED ORDER — IRBESARTAN 150 MG PO TABS
150.0000 mg | ORAL_TABLET | Freq: Every day | ORAL | Status: DC
  Administered 2022-03-10 – 2022-03-14 (×5): 150 mg via ORAL
  Filled 2022-03-10 (×5): qty 1

## 2022-03-10 MED ORDER — GABAPENTIN 300 MG PO CAPS
900.0000 mg | ORAL_CAPSULE | ORAL | Status: DC
  Administered 2022-03-10 – 2022-03-14 (×10): 900 mg via ORAL
  Filled 2022-03-10 (×10): qty 3

## 2022-03-10 MED ORDER — ONDANSETRON HCL 4 MG/2ML IJ SOLN: 4.0000 mg | Freq: Three times a day (TID) | INTRAMUSCULAR | Status: DC | PRN

## 2022-03-10 MED ORDER — ENOXAPARIN SODIUM 40 MG/0.4ML IJ SOSY
40.0000 mg | PREFILLED_SYRINGE | INTRAMUSCULAR | Status: DC
  Administered 2022-03-10 – 2022-03-14 (×5): 40 mg via SUBCUTANEOUS
  Filled 2022-03-10 (×5): qty 0.4

## 2022-03-10 MED ORDER — GUAIFENESIN-DM 100-10 MG/5ML PO SYRP: 10.0000 mL | ORAL_SOLUTION | ORAL | Status: DC | PRN

## 2022-03-10 MED ORDER — SODIUM CHLORIDE 0.9 % IV SOLN: INTRAVENOUS | Status: DC

## 2022-03-10 MED ORDER — SODIUM CHLORIDE 0.9 % IV SOLN: 3.5000 g/m2 | Freq: Once | INTRAVENOUS | Status: DC

## 2022-03-10 MED ORDER — SODIUM CHLORIDE 0.9 % IV SOLN
8.0000 mg | Freq: Three times a day (TID) | INTRAVENOUS | Status: DC | PRN
  Filled 2022-03-10: qty 4

## 2022-03-10 MED ORDER — SODIUM CHLORIDE 0.9 % IV SOLN
Freq: Once | INTRAVENOUS | Status: DC
  Filled 2022-03-10: qty 8

## 2022-03-10 MED ORDER — HYDROCORTISONE (PERIANAL) 2.5 % EX CREA: 1.0000 "application " | TOPICAL_CREAM | Freq: Two times a day (BID) | CUTANEOUS | Status: DC | PRN

## 2022-03-10 MED ORDER — DEXAMETHASONE 4 MG PO TABS
4.0000 mg | ORAL_TABLET | Freq: Every day | ORAL | Status: DC
  Administered 2022-03-11 – 2022-03-15 (×5): 4 mg via ORAL
  Filled 2022-03-10 (×5): qty 1

## 2022-03-10 MED ORDER — ALUM & MAG HYDROXIDE-SIMETH 200-200-20 MG/5ML PO SUSP: 60.0000 mL | ORAL | Status: DC | PRN

## 2022-03-10 MED ORDER — ACETAMINOPHEN 325 MG PO TABS: 650.0000 mg | ORAL_TABLET | ORAL | Status: DC | PRN

## 2022-03-10 MED ORDER — ONDANSETRON 4 MG PO TBDP: 4.0000 mg | ORAL_TABLET | Freq: Three times a day (TID) | ORAL | Status: DC | PRN

## 2022-03-10 MED ORDER — CEFADROXIL 500 MG PO CAPS
1000.0000 mg | ORAL_CAPSULE | Freq: Two times a day (BID) | ORAL | Status: DC
  Administered 2022-03-10 – 2022-03-15 (×10): 1000 mg via ORAL
  Filled 2022-03-10 (×12): qty 2

## 2022-03-10 MED ORDER — SODIUM BICARBONATE 8.4 % IV SOLN
INTRAVENOUS | Status: DC
  Filled 2022-03-10 (×7): qty 150
  Filled 2022-03-10 (×4): qty 1000

## 2022-03-10 MED ORDER — STERILE WATER FOR INJECTION IV SOLN
INTRAVENOUS | Status: DC
  Filled 2022-03-10 (×2): qty 1000

## 2022-03-10 NOTE — H&P (Signed)
[facility] Admission Note  Patient Care Team: Karleen Hampshire., MD as PCP - General (Internal Medicine)  CHIEF COMPLAINTS/PURPOSE OF CONSULTATION:  Primary CNS Lymphoma  Oncology History  CNS lymphoma ([place])  01/02/2022 Cancer Staging   Staging form: Brain and Spinal Cord, AJCC Version 9 - Clinical stage from 01/02/2022: WHO G4 - Signed by Ventura Sellers, MD on 01/27/2022 Histopathologic type: Malignant lymphoma, large B-cell, diffuse, NOS Stage prefix: Initial diagnosis Histologic grading system: 4 grade system    01/27/2022 Initial Diagnosis   CNS lymphoma ([place])   03/10/2022 -  Chemotherapy   Patient is on Treatment Plan : NON-HODGKINS LYMPHOMA Rituximab q21d     03/10/2022 -  Chemotherapy   Patient is on Treatment Plan : IP NON-HODGKINS LYMPHOMA High Dose Methotrexate + Leucovorin Rescue       HISTORY OF PRESENTING ILLNESS:  Isabel Gibbs 76 y.o. female presents today for initial cycle of methotrexate induction+rituximab for primary cns lymphoma.  She describes modest improvement in cognition since treating the urinary tract infection last week.  She still has several days of oral antibiotics remaining, but currently asymptomatic.  At home she has been dosing decadron '4mg'$  daily in addition to the abx.  No other clinical changes reported.   MEDICAL HISTORY:  Past Medical History:  Diagnosis Date   Hypertension    Prediabetes    Trigeminal neuralgia of right side of face     SURGICAL HISTORY: Past Surgical History:  Procedure Laterality Date   APPLICATION OF CRANIAL NAVIGATION Left 01/02/2022   Procedure: APPLICATION OF CRANIAL NAVIGATION;  Surgeon: Judith Part, MD;  Location: [place];  Service: Neurosurgery;  Laterality: Left;   CEREBRAL MICROVASCULAR DECOMPRESSION Right 08/02/2018   [facility]   FRAMELESS  BIOPSY WITH BRAINLAB Left 01/02/2022   Procedure: LEFT BRAIN  BIOPSY WITH Lucky Rathke;  Surgeon: Judith Part, MD;  Location: [place];   Service: Neurosurgery;  Laterality: Left;   IR IMAGING GUIDED PORT INSERTION  03/04/2022   RADIOLOGY WITH ANESTHESIA N/A 01/01/2022   Procedure: MRI of Brain with and without contrast;  Surgeon: Radiologist, Medication, MD;  Location: [place];  Service: Radiology;  Laterality: N/A;    SOCIAL HISTORY: Social History   Socioeconomic History   Marital status: Widowed    Spouse name: Not on file   Number of children: Not on file   Years of education: Not on file   Highest education level: Not on file  Occupational History   Not on file  Tobacco Use   Smoking status: Former    Types: Cigarettes    Quit date: 2000    Years since quitting: 23.2   Smokeless tobacco: Never  Substance and Sexual Activity   Alcohol use: Not on file   Drug use: Not on file   Sexual activity: Not on file  Other Topics Concern   Not on file  Social History Narrative   Not on file   Social Determinants of Health   Financial Resource Strain: Not on file  Food Insecurity: Not on file  Transportation Needs: Not on file  Physical Activity: Not on file  Stress: Not on file  Social Connections: Not on file  Intimate Partner Violence: Not on file    FAMILY HISTORY: Family History  Problem Relation Age of Onset   Heart failure Brother    Diabetes Brother    Yves Dill Parkinson White syndrome Son     ALLERGIES:  is allergic to codeine, lisinopril, and hydrocodone-acetaminophen.  MEDICATIONS:  No current facility-administered medications for this encounter.    REVIEW OF SYSTEMS:   Constitutional: Denies fevers, chills or abnormal weight loss Eyes: Denies blurriness of vision Ears, nose, mouth, throat, and face: Denies mucositis or sore throat Respiratory: Denies cough, dyspnea or wheezes Cardiovascular: Denies palpitation, chest discomfort or lower extremity swelling Gastrointestinal:  Denies nausea, constipation, diarrhea GU: Denies dysuria or incontinence Skin: Denies abnormal skin  rashes Neurological: Per HPI Musculoskeletal: Denies joint pain, back or neck discomfort. No decrease in ROM Behavioral/Psych: Denies anxiety, disturbance in thought content, and mood instability   PHYSICAL EXAMINATION: Vitals:   03/10/22 1134  BP: 126/74  Pulse: 62  Resp: 16  Temp: 97.7 F (36.5 C)  SpO2: 99%   KPS: 60. General: Alert, cooperative, pleasant, in no acute distress Head: Craniotomy scar noted, dry and intact. EENT: No conjunctival injection or scleral icterus. Oral mucosa moist Lungs: Resp effort normal Cardiac: Regular rate and rhythm Abdomen: Soft, non-distended abdomen Skin: No rashes cyanosis or petechiae. Extremities: No clubbing or edema  NEUROLOGIC EXAM: MMental Status: Awake, but drowsy, attentive to examiner only with stimulation. Oriented to self and environment. Language is impaired with regards to fluency, comprehension intact to multi step.  Cranial Nerves: Visual acuity is grossly normal. Visual fields are full. Extra-ocular movements intact. No ptosis. Face is symmetric Motor: Tone and bulk are normal. Power is noted 4/5 in right arm and leg. Reflexes are symmetric, no pathologic reflexes present.  Sensory: Intact to light touch Gait: Hemiparetic   LABORATORY DATA:  I have reviewed the data as listed Lab Results  Component Value Date   WBC 7.4 03/06/2022   HGB 13.5 03/06/2022   HCT 43.7 03/06/2022   MCV 91.4 03/06/2022   PLT 191 03/06/2022   Recent Labs    12/30/21 1732 12/31/21 0725 03/05/22 1835 03/06/22 0421  NA 136 137 141 139  K 3.6 3.9 4.1 4.2  CL 102 108 104 105  CO2 '24 23 29 29  '$ GLUCOSE 152* 104* 189* 162*  BUN 13 11 24* 21  CREATININE 0.82 0.68 0.75 0.55  CALCIUM 8.6* 7.1* 8.9 8.6*  GFRNONAA >60 >60 >60 >60  PROT 6.3*  --  6.4*  --   ALBUMIN 3.6  --  3.7  --   AST 31  --  17  --   ALT 38  --  23  --   ALKPHOS 59  --  58  --   BILITOT 1.1  --  0.7  --     RADIOGRAPHIC STUDIES: I have personally reviewed the  radiological images as listed and agreed with the findings in the report. CT Head Wo Contrast  Result Date: 03/05/2022 CLINICAL DATA:  Altered mental status EXAM: CT HEAD WITHOUT CONTRAST TECHNIQUE: Contiguous axial images were obtained from the base of the skull through the vertex without intravenous contrast. RADIATION DOSE REDUCTION: This exam was performed according to the departmental dose-optimization program which includes automated exposure control, adjustment of the mA and/or kV according to patient size and/or use of iterative reconstruction technique. COMPARISON:  02/14/2022 MRI, correlation is also made with CT head 01/02/2022 FINDINGS: Brain: Likely further interval increase in the size of the mass spanning the bilateral frontal lobes, which measures approximately 5.3 x 5.4 x 4.8 cm (series 3, image 17 and series 4, image 24), previously 3.2 x 4.1 x 4.1 cm when remeasured similarly on the 02/14/2022 MRI, although comparison is limited by the absence of intravenous contrast on the current study  and differences in modality. Increased surrounding hypodensity, likely edema. Increased mass effect on the frontal horns of the lateral ventricles. Additional previously noted lesion in the right basal ganglia has also likely increased in size (series 3, image 17) No definite acute infarct, hemorrhage, hydrocephalus, or extra-axial collection. Vascular: No hyperdense vessel. Skull: Right occipital craniotomy. Left frontal burr hole. Hyperostosis frontalis. No acute osseous abnormality. Sinuses/Orbits: No acute finding. Status post bilateral lens replacements. Other: The mastoids are well aerated. IMPRESSION: 1. Suspect significant increase in the size of the mass spanning the bilateral frontal lobes, when accounting for the absence of intravenous contrast and differences in technique between the current study and the prior MRI. The right basal ganglia lesion has likely also increased in size. Consider MRI with  and without contrast for better comparison. 2. No other acute intracranial process. Electronically Signed   By: Merilyn Baba M.D.   On: 03/05/2022 19:35   MR Brain W and Wo Contrast  Result Date: 03/05/2022 CLINICAL DATA:  Follow-up examination for CNS neoplasm. EXAM: MRI HEAD WITHOUT AND WITH CONTRAST TECHNIQUE: Multiplanar, multiecho pulse sequences of the brain and surrounding structures were obtained without and with intravenous contrast. CONTRAST:  2m GADAVIST GADOBUTROL 1 MMOL/ML IV SOLN COMPARISON:  Prior CT from earlier the same day as well as previous MRI from 02/14/2022. FINDINGS: Brain: There has been interval progression and worsening of disease with increased size of the bifrontal mass as compared to most recent MRI from 02/14/2022. The site of the initial lesion at the anterior inferior left frontal lobe has increased in size, now measuring 4.9 x 3.4 cm in this region, previously 3.5 x 2.5 cm when measured in a similar fashion. Bulky tumor extends superiorly, and crosses the midline across the anterior genu of the corpus callosum, with involvement of the contralateral right frontal lobe. This as all increased in size from previous. Superior extension into the left greater than right frontal lobes superiorly has also progressed (series 20, image 89). Overall, size of the primary lesion has more than doubled in size from prior. Associated enhancement is more pronounced and increased from prior. Additionally, associated susceptibility artifact consistent with blood products is also increased from prior. Progressive mass effect on the anterior aspect of the lateral ventricles anteriorly. No hydrocephalus or trapping. Associated vasogenic edema within the anterior inferior left greater than right frontal lobes has also progressed. Localized left-to-right shift of up to approximately 7 mm noted along the anterior/inferior falx. An additional site of lymphomatous deposit again seen within the right basal  ganglia, measuring 1.5 x 1.6 cm on today's exam (series 20, image 62), increased in size and now demonstrating more solid and prominent enhancement. Additional involvement of the contralateral anterior left basal ganglia is also more prominent and progressed (series 20, image 79). Associated edema extending into the left cerebral peduncle again noted. 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 No other evidence for acute or subacute infarct. No other mass lesion or abnormal enhancement. No extra-axial collections. Probable irregular postoperative scarring and enhancement noted at the right CP angle cistern. Vascular: Major intracranial vascular flow voids are maintained. Skull and upper cervical spine: Craniocervical junction within normal limits. Bone marrow signal intensity normal. No focal marrow replacing lesion. Remote right occipital craniotomy. Hyperostosis frontalis interna noted. No scalp soft tissue abnormality. Sinuses/Orbits: Prior bilateral ocular lens replacement. Globes orbital soft tissues demonstrate no acute finding. Paranasal sinuses are largely clear. Trace left mastoid effusion noted, of doubtful significance. Other:  None. IMPRESSION: 1. Interval progression of disease as compared to most recent MRI from 02/14/2022 as evidence by increased size and enhancement of the bifrontal mass. Associated vasogenic edema within the anterior-inferior left greater than right frontal lobes has also progressed and worsened, with localized 7 mm left-to-right shift now seen along the anterior/inferior falx. Progressive mass effect on the anterior lateral ventricles without hydrocephalus. 2. 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 3. 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 Electronically Signed   By:  Jeannine Boga M.D.   On: 03/05/2022 22:50   MR BRAIN W WO CONTRAST  Result Date: 02/15/2022 CLINICAL DATA:  brain/CNS neoplasm. Assess treatment response. History of CNS lymphoma. EXAM: MRI HEAD WITHOUT AND WITH CONTRAST TECHNIQUE: Multiplanar, multiecho pulse sequences of the brain and surrounding structures were obtained without and with intravenous contrast. CONTRAST:  27m GADAVIST GADOBUTROL 1 MMOL/ML IV SOLN COMPARISON:  PET scan 02/12/2022. Head CT 01/02/2022. MRI 01/01/2022. FINDINGS: Brain: There has been considerable progression of disease since the study 01/01/2022. The epicenter of the initial lesion in the left inferior frontal white matter previously measured approximately 14 x 18 mm as far as the abnormal enhancement, but now measures 25 x 35 mm in that region. Similar increase in tumor mass with respect to the disease affecting the genu of the corpus callosum and crossing elsewhere into the frontal white matter. The overall volume of tumor is estimated to have doubled. There is new lymphoma visible in the right basal ganglia and inferior internal capsule with the region of enhancement measuring approximally 9 x 12 mm. Disease is probably crossing by the anterior commissure and manifesting as edema at the inferior basal ganglia and internal capsule on the left, with edema extending into the cerebral peduncle. There is not abnormal enhancement occurring in this region presently. Widespread chronic small-vessel ischemic changes elsewhere throughout the brain are unchanged. Minor post biopsy changes in the left frontal lobe show expected evolutionary change without worsening. Distant right occipital craniotomy without residual or unexpected findings in that region. No hydrocephalus. No extra-axial collection. Vascular: Major vessels at the base of the brain show flow. Skull and upper cervical spine: Otherwise negative Sinuses/Orbits: Clear/normal Other: None IMPRESSION: Compared to the study of  01/01/2022, there is marked worsening of disease. Lymphoma tumor volume in the frontal lobes left more than right with crossing via the genu of the corpus callosum has approximally doubled in volume. There is a new manifestation of lymphoma involvement at the inferior basal ganglia/internal capsule on the right, probably crossing by the anterior commissure to the base of the brain on the left, where this is manifest more by increasing edema pattern with relatively subtle low level enhancement, but with restricted diffusion. Electronically Signed   By: MNelson ChimesM.D.   On: 02/15/2022 16:07   NM PET Image Initial (PI) Skull Base To Thigh (F-18 FDG)  Result Date: 02/13/2022 CLINICAL DATA:  Initial treatment strategy for CNS lymphoma. EXAM: NUCLEAR MEDICINE PET SKULL BASE TO THIGH TECHNIQUE: 9.7 mCi F-18 FDG was injected intravenously. Full-ring PET imaging was performed from the skull base to thigh after the radiotracer. CT data was obtained and used for attenuation correction and anatomic localization. Fasting blood glucose: 102 mg/dl COMPARISON:  Brain MRI 01/01/2022 FINDINGS: Mediastinal blood pool activity: SUV max 2.93 Liver activity: SUV max NA NECK: Intense metabolic activity within the LEFT frontal lobe which  crosses the corpus callosum consistent with lymphoma. No hypermetabolic adenopathy in the neck. Incidental CT findings: none CHEST: No hypermetabolic mediastinal or hilar nodes. No suspicious pulmonary nodules on the CT scan. Incidental CT findings: none ABDOMEN/PELVIS: No abnormal hypermetabolic activity within the liver, pancreas, adrenal glands, or spleen. No hypermetabolic lymph nodes in the abdomen or pelvis. Incidental CT findings: none SKELETON: No focal hypermetabolic activity to suggest skeletal metastasis. Incidental CT findings: none IMPRESSION: 1. Hypermetabolic lesion in the LEFT frontal lobe crossing midline consistent with CNS lymphoma. 2. No evidence of metastatic lymphoma within  neck, chest, abdomen or pelvis. Electronically Signed   By: Suzy Bouchard M.D.   On: 02/13/2022 16:18   DG Chest Portable 1 View  Result Date: 03/05/2022 CLINICAL DATA:  Altered mental status. EXAM: PORTABLE CHEST 1 VIEW COMPARISON:  December 30, 2021 FINDINGS: A right-sided venous Port-A-Cath is seen with its distal tip noted at the junction of the superior vena cava and right atrium. The heart size and mediastinal contours are within normal limits. Both lungs are clear. The visualized skeletal structures are unremarkable. IMPRESSION: 1. Right-sided venous Port-A-Cath positioning, as described above. 2. No acute cardiopulmonary disease. Electronically Signed   By: Virgina Norfolk M.D.   On: 03/05/2022 19:31   MR TOTAL SPINE METS SCREENING  Result Date: 02/15/2022 CLINICAL DATA:  76 year old female with history of CNS lymphoma, evaluate for spinal metastatic disease. EXAM: MRI TOTAL SPINE WITHOUT AND WITH CONTRAST TECHNIQUE: Multisequence MR imaging of the spine from the cervical spine to the sacrum was performed prior to and following IV contrast administration for evaluation of spinal metastatic disease. CONTRAST:  Please see contrast documentation. COMPARISON:  Comparison made with previous brain MRI from 01/01/2022. FINDINGS: MRI CERVICAL SPINE FINDINGS Alignment: Physiologic with preservation of the normal cervical lordosis. No listhesis. Vertebrae: Vertebral body height maintained without acute or chronic fracture. Bone marrow signal intensity within normal limits. No discrete or worrisome osseous lesions. No abnormal marrow edema or enhancement. Cord: Normal signal and morphology. No evidence for metastatic disease within the cervical spine. No abnormal enhancement. Posterior Fossa, vertebral arteries, paraspinal tissues: Paraspinous soft tissues within normal limits. 1.2 cm right thyroid nodule noted, of doubtful significance given size and patient age, no follow-up imaging recommended (ref: J Am  Coll Radiol. 2015 Feb;12(2): 143-50). Disc levels: Mild for age degenerative spondylosis present at C3-4 through C6-7. No high-grade spinal stenosis or cord impingement. Foramina remain largely patent. MRI THORACIC SPINE FINDINGS Alignment: Exaggeration of the normal thoracic kyphosis. No listhesis. Vertebrae: Vertebral body height maintained without acute or chronic fracture. Bone marrow signal intensity within normal limits. No worrisome osseous lesions. No abnormal marrow edema or enhancement. Cord: Normal signal and morphology. No evidence for metastatic disease. No abnormal enhancement. Paraspinal and other soft tissues: Unremarkable. Disc levels: Central disc protrusion noted at T12-L1 without significant stenosis. Otherwise, no other significant spondylosis for age. No canal or neural foraminal stenosis. MRI LUMBAR SPINE FINDINGS Segmentation: Standard. Lowest well-formed disc space labeled the L5-S1 level. Alignment: Physiologic with preservation of the normal lumbar lordosis. No listhesis. Vertebrae: Vertebral body height maintained without acute or chronic fracture. Bone marrow signal intensity within normal limits. No discrete or worrisome osseous lesions. No abnormal marrow edema or enhancement. Conus medullaris: Extends to the L1 level and appears normal. No abnormal enhancement seen about the conus medullaris or nerve roots of the cauda equina to suggest drop metastases. Paraspinal and other soft tissues: Visualized paraspinous soft tissues within normal limits. Disc levels: Shallow posterior  disc bulge noted at L5-S1 without significant spinal stenosis. Lower lumbar facet hypertrophy noted. IMPRESSION: 1. No MRI evidence for metastatic disease within the cervical, thoracic, or lumbar spine. 2. Mild for age degenerative spondylosis without significant stenosis or overt neural impingement. Electronically Signed   By: Jeannine Boga M.D.   On: 02/15/2022 03:46   IR IMAGING GUIDED PORT  INSERTION  Result Date: 03/04/2022 INDICATION: Chemotherapy EXAM: Ultrasound-guided puncture of the right internal jugular vein Placement of a right-sided chest port using fluoroscopic guidance MEDICATIONS: None ANESTHESIA/SEDATION: Moderate (conscious) sedation was employed during this procedure. A total of Versed 1 mg and Fentanyl 50 mcg was administered intravenously. Moderate Sedation Time: 30 minutes. The patient's level of consciousness and vital signs were monitored continuously by radiology nursing throughout the procedure under my direct supervision. FLUOROSCOPY TIME:  Fluoroscopy Time: 0.3 minutes (2 mGy) COMPLICATIONS: None immediate. PROCEDURE: Informed written consent was obtained from the patient after a thorough discussion of the procedural risks, benefits and alternatives. All questions were addressed. Maximal Sterile Barrier Technique was utilized including caps, mask, sterile gowns, sterile gloves, sterile drape, hand hygiene and skin antiseptic. A timeout was performed prior to the initiation of the procedure. The patient was placed supine on the exam table. The right neck and chest was prepped and draped in the standard sterile fashion. A preliminary ultrasound of the right neck was performed and demonstrates a patent right internal jugular vein. A permanent ultrasound image was stored in the electronic medical record. The overlying skin was anesthetized with 1% Lidocaine. Using ultrasound guidance, access was obtained into the right internal jugular vein using a 21 gauge micropuncture set. A wire was advanced into the SVC, a short incision was made at the puncture site, and serial dilatation performed. Next, in an ipsilateral infraclavicular location, an incision was made at the site of the subcutaneous reservoir. Blunt dissection was used to open a pocket to contain the reservoir. A subcutaneous tunnel was then created from the port site to the puncture site. A(n) 8 Fr single lumen catheter  was advanced through the tunnel. The catheter was attached to the port and this was placed in the subcutaneous pocket. Under fluoroscopic guidance, a peel away sheath was placed, and the catheter was trimmed to the appropriate length and was advanced into the central veins. The catheter length is 23 cm. The tip of the catheter lies near the superior cavoatrial junction. The port flushes and aspirates appropriately. The port was flushed and locked with heparinized saline. The port pocket was closed in 2 layers using 3-0 and 4-0 Vicryl/absorbable suture. Dermabond was also applied to both incisions. The patient tolerated the procedure well and was transferred to recovery in stable condition. IMPRESSION: Successful placement of a right-sided chest port via the right internal jugular vein. The port is ready for immediate use. Electronically Signed   By: Albin Felling M.D.   On: 03/04/2022 13:52    ASSESSMENT & PLAN:  Primary CNS Lymphoma  1)Primary CNS Lymphoma  Tissue confirmation by brain biopsy of presence of aggressive diffuse large B-cell lymphoma with no evidence of leptomeningeal or ocular spread.  Recent MRI brain demonstrates rapid progression of disease.   -appropriate to proceed with C1 of high dose MTX with leucovorin rescue -MTX-R therapy C1D1 today or when able: Methotrexate 3.5g/m2 given over 4 hours  -Leucovorin '16mg'$  q6 hours 24 hours after MTX infusion   -Rituxan can be given 24-48 hours after MTX infusion, pending hepatitis panel -To start ASAP: IV fluids,  D5 in sterile water with 116mq sodium bicarbonate at 1241mhr.  Need to adjust rate to urine pH >7 closer to 8. -daily labs with cbc, cmp -daily urine pH once it reaches target pH of >7 -daily MTX levels until MTX levels <0.10 -continue same supportive medications with exception of any PPI which interferes with clearance of MTX  -ok to continue decadron '4mg'$  daily as prior  2) E. Coli urethritis -Treated, asymptomatic  currently -Will con't Duricef through 03/15/22  3) HTN -Irbesartan  4)DVT ppx -Lovenox   5)Dispo -Expect 4-7 days admission for MTX administration and clearance  All questions were answered. The patient knows to call the clinic with any problems, questions or concerns.  The total time spent in the encounter was 55 minutes and more than 50% was on counseling and review of test results     ZaVentura SellersMD 03/10/2022 11:45 AM

## 2022-03-11 DIAGNOSIS — C8589 Other specified types of non-Hodgkin lymphoma, extranodal and solid organ sites: Secondary | ICD-10-CM | POA: Diagnosis not present

## 2022-03-11 LAB — CBC WITH DIFFERENTIAL/PLATELET
Abs Immature Granulocytes: 0.18 10*3/uL — ABNORMAL HIGH (ref 0.00–0.07)
Basophils Absolute: 0 10*3/uL (ref 0.0–0.1)
Basophils Relative: 0 %
Eosinophils Absolute: 0.1 10*3/uL (ref 0.0–0.5)
Eosinophils Relative: 1 %
HCT: 41.9 % (ref 36.0–46.0)
Hemoglobin: 13.4 g/dL (ref 12.0–15.0)
Immature Granulocytes: 2 %
Lymphocytes Relative: 25 %
Lymphs Abs: 2.2 10*3/uL (ref 0.7–4.0)
MCH: 28.7 pg (ref 26.0–34.0)
MCHC: 32 g/dL (ref 30.0–36.0)
MCV: 89.7 fL (ref 80.0–100.0)
Monocytes Absolute: 0.5 10*3/uL (ref 0.1–1.0)
Monocytes Relative: 6 %
Neutro Abs: 5.7 10*3/uL (ref 1.7–7.7)
Neutrophils Relative %: 66 %
Platelets: 194 10*3/uL (ref 150–400)
RBC: 4.67 MIL/uL (ref 3.87–5.11)
RDW: 14.3 % (ref 11.5–15.5)
WBC: 8.7 10*3/uL (ref 4.0–10.5)
nRBC: 0 % (ref 0.0–0.2)

## 2022-03-11 LAB — COMPREHENSIVE METABOLIC PANEL
ALT: 32 U/L (ref 0–44)
AST: 17 U/L (ref 15–41)
Albumin: 2.7 g/dL — ABNORMAL LOW (ref 3.5–5.0)
Alkaline Phosphatase: 44 U/L (ref 38–126)
Anion gap: 9 (ref 5–15)
BUN: 22 mg/dL (ref 8–23)
CO2: 33 mmol/L — ABNORMAL HIGH (ref 22–32)
Calcium: 8 mg/dL — ABNORMAL LOW (ref 8.9–10.3)
Chloride: 98 mmol/L (ref 98–111)
Creatinine, Ser: 0.62 mg/dL (ref 0.44–1.00)
GFR, Estimated: >60 mL/min (ref >60)
Glucose, Bld: 153 mg/dL — ABNORMAL HIGH (ref 70–99)
Potassium: 3.2 mmol/L — ABNORMAL LOW (ref 3.5–5.1)
Sodium: 140 mmol/L (ref 135–145)
Total Bilirubin: 0.7 mg/dL (ref 0.3–1.2)
Total Protein: 4.7 g/dL — ABNORMAL LOW (ref 6.5–8.1)

## 2022-03-11 LAB — URINALYSIS, ROUTINE W REFLEX MICROSCOPIC
Bilirubin Urine: NEGATIVE
Glucose, UA: 50 mg/dL — AB
Hgb urine dipstick: NEGATIVE
Ketones, ur: NEGATIVE mg/dL
Nitrite: NEGATIVE
Protein, ur: NEGATIVE mg/dL
Specific Gravity, Urine: 1.01 (ref 1.005–1.030)
pH: 8 (ref 5.0–8.0)

## 2022-03-11 MED ORDER — SODIUM CHLORIDE 0.9 % IV SOLN
Freq: Once | INTRAVENOUS | Status: AC
  Administered 2022-03-11: 36 mg via INTRAVENOUS
  Filled 2022-03-11: qty 8

## 2022-03-11 MED ORDER — SODIUM CHLORIDE 0.9 % IV SOLN: 3.5000 g/m2 | Freq: Once | INTRAVENOUS | Status: DC

## 2022-03-11 MED ORDER — METHOTREXATE SODIUM (PF) CHEMO INJECTION 1 GM/40ML
3.5000 g/m2 | Freq: Once | INTRAVENOUS | Status: AC
  Administered 2022-03-11: 7 g via INTRAVENOUS
  Filled 2022-03-11: qty 80

## 2022-03-11 MED ORDER — CHLORHEXIDINE GLUCONATE CLOTH 2 % EX PADS
6.0000 | MEDICATED_PAD | Freq: Every day | CUTANEOUS | Status: DC
  Administered 2022-03-11 – 2022-03-15 (×5): 6 via TOPICAL

## 2022-03-11 MED ORDER — POTASSIUM CHLORIDE 10 MEQ/100ML IV SOLN
10.0000 meq | INTRAVENOUS | Status: AC
  Administered 2022-03-11 (×3): 10 meq via INTRAVENOUS
  Filled 2022-03-11: qty 100

## 2022-03-11 NOTE — TOC Initial Note (Signed)
Transition of Care (TOC) - Initial/Assessment Note  ? ? ?Patient Details  ?Name: Isabel Gibbs ?MRN: [id number] ?Date of Birth: May 14, 1946 ? ?Transition of Care (TOC) CM/SW Contact:    ?Janaye Corp, Marjie Skiff, RN ?Phone Number: ?03/11/2022, 1:41 PM ? ?Clinical Narrative:                 ?Pt had been set up with [facility] for home health services at prior hospital admission on 3/11. [facility] liaison alerted that pt back in the hospital. They will follow along and continue services at dc. Will need MD orders for HHPT/OT at dc. ? ?Expected Discharge Plan: [place] ?Barriers to Discharge: Continued Medical Work up ?  ? ?Expected Discharge Plan and Services ?Expected Discharge Plan: [place] ?  ?Discharge Planning Services: CM Consult ?  ?Living arrangements for the past 2 months: [place] ?                ?  ?  ?  ?  ?  ?HH Arranged: PT, OT ?[place] Agency: [place] ?Date HH Agency Contacted: 03/11/22 ?Time [place]: 1340 ?Representative spoke with at [place]: Tommi Rumps ? ?Prior Living Arrangements/Services ?Living arrangements for the past 2 months: [place] ?  ?Patient language and need for interpreter reviewed:: Yes ?       ?Need for Family Participation in Patient Care: Yes (Comment) ?Care giver support system in place?: Yes (comment) ?Current home services: Home OT, Home PT ?Criminal Activity/Legal Involvement Pertinent to Current Situation/Hospitalization: No - Comment as needed ? ?Activities of Daily Living ?Home Assistive Devices/Equipment: Gilford Rile (specify type) ?ADL Screening (condition at time of admission) ?Patient's cognitive ability adequate to safely complete daily activities?: No ?Is the patient deaf or have difficulty hearing?: No ?Does the patient have difficulty seeing, even when wearing glasses/contacts?: Yes ?Does the patient have difficulty concentrating, remembering, or making decisions?: Yes ?Patient able to express need for assistance  with ADLs?: No ?Does the patient have difficulty dressing or bathing?: Yes ?Independently performs ADLs?: No ?Communication: Independent ?Dressing (OT): Independent ?Grooming: Independent ?Feeding: Independent ?Bathing: Independent ?Toileting: Independent ?In/Out Bed: Independent ?Walks in Home: Independent ?Does the patient have difficulty walking or climbing stairs?: No ?Weakness of Legs: Both ?Weakness of Arms/Hands: Both ? ?  ?Orientation: : Oriented to Self, Oriented to Place, Oriented to  Time, Oriented to Situation ?Alcohol / Substance Use: Not Applicable ?Psych Involvement: No (comment) ? 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 Diagnosis Date Noted  ? 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 UTI (urinary tract infection) 03/05/2022  ? Acute encephalopathy 03/05/2022  ? Goals of care, counseling/discussion 02/17/2022  ? CNS lymphoma ([place]) 01/27/2022  ? Brain mass 01/02/2022  ? Brain lesion 12/31/2021  ? New onset atrial fibrillation ([place]) 12/31/2021  ? Prolonged QT interval 12/31/2021  ? Hypertension   ? Prediabetes   ? ?PCP:  Karleen Hampshire., MD ?Pharmacy:   ?[facility] [name] ?[place] [address] ?Phone: [phone] Fax: [phone] ? ? ? ? ?Social Determinants of Health (SDOH) Interventions ?  ? ?Readmission Risk Interventions ?Readmission Risk Prevention Plan 03/11/2022  ?Transportation Screening Complete  ?PCP or Specialist Appt within 5-7 Days Complete  ?Home Care Screening Complete  ?Medication Review (RN CM) Complete  ? ? ? ?

## 2022-03-11 NOTE — Progress Notes (Signed)
[name] ?Neuro-Oncology Progress Note ? ?Patient Care Team: ?Karleen Hampshire., MD as PCP - General (Internal Medicine) ? ?CHIEF COMPLAINTS/PURPOSE OF CONSULTATION:  ?Primary CNS Lymphoma ? ?Oncology History  ?CNS lymphoma ([place])  ?01/02/2022 Cancer Staging  ? Staging form: Brain and Spinal Cord, AJCC Version 9 ?- Clinical stage from 01/02/2022: WHO G4 - Signed by Ventura Sellers, MD on 01/27/2022 ?Histopathologic type: Malignant lymphoma, large B-cell, diffuse, NOS ?Stage prefix: Initial diagnosis ?Histologic grading system: 4 grade system ? ?  ?01/27/2022 Initial Diagnosis  ? CNS lymphoma ([place]) ?  ?03/10/2022 -  Chemotherapy  ? Patient is on Treatment Plan : NON-HODGKINS LYMPHOMA Rituximab q21d  ?   ?03/11/2022 -  Chemotherapy  ? Patient is on Treatment Plan : IP NON-HODGKINS LYMPHOMA High Dose Methotrexate + Leucovorin Rescue  ?   ? ? ?INTERVAL HISTORY: No complaints today, resting comfortably.  Husband at bedside, plan for MTX administration today. ? ?HISTORY OF PRESENTING ILLNESS:  ?Isabel Gibbs 76 y.o. female presents today for initial cycle of methotrexate induction+rituximab for primary cns lymphoma.  She describes modest improvement in cognition since treating the urinary tract infection last week.  She still has several days of oral antibiotics remaining, but currently asymptomatic.  At home she has been dosing decadron '4mg'$  daily in addition to the abx.  No other clinical changes reported. ? ? ?MEDICAL HISTORY:  ?Past Medical History:  ?Diagnosis Date  ? Hypertension   ? Prediabetes   ? 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 ? ?SURGICAL HISTORY: ?Past Surgical History:  ?Procedure Laterality Date  ? APPLICATION OF CRANIAL NAVIGATION Left 01/02/2022  ? Procedure: APPLICATION OF CRANIAL NAVIGATION;  Surgeon: Judith Part, MD;  Location: [place];  Service: Neurosurgery;  Laterality: Left;  ? CEREBRAL MICROVASCULAR DECOMPRESSION Right 08/02/2018  ? [place]  ? FRAMELESS  BIOPSY WITH BRAINLAB Left  01/02/2022  ? Procedure: LEFT BRAIN  BIOPSY WITH Lucky Rathke;  Surgeon: Judith Part, MD;  Location: [place] [facility];  Service: Neurosurgery;  Laterality: Left;  ? IR IMAGING GUIDED PORT INSERTION  03/04/2022  ? RADIOLOGY WITH ANESTHESIA N/A 01/01/2022  ? Procedure: MRI of Brain with and without contrast;  Surgeon: Radiologist, Medication, MD;  Location: [place];  Service: Radiology;  Laterality: N/A;  ? ? ?SOCIAL HISTORY: ?Social History  ? ?Socioeconomic History  ? Marital status: Widowed  ?  Spouse name: Not on file  ? Number of children: Not on file  ? Years of education: Not on file  ? Highest education level: Not on file  ?Occupational History  ? Not on file  ?Tobacco Use  ? Smoking status: Former  ?  Types: Cigarettes  ?  Quit date: 2000  ?  Years since quitting: 23.2  ? Smokeless tobacco: Never  ?Substance and Sexual Activity  ? Alcohol use: Not on file  ? Drug use: Not on file  ? Sexual activity: Not on file  ?Other Topics Concern  ? Not on file  ?Social History Narrative  ? Not on file  ? ?Social Determinants of Health  ? ?Financial Resource Strain: Not on file  ?Food Insecurity: Not on file  ?Transportation Needs: Not on file  ?Physical Activity: Not on file  ?Stress: Not on file  ?Social Connections: Not on file  ?Intimate Partner Violence: Not on file  ? ? ?FAMILY HISTORY: ?Family History  ?Problem Relation Age of Onset  ? Heart failure Brother   ? Diabetes Brother   ? Isabel Gibbs  Parkinson White syndrome Son   ? ? ?ALLERGIES:  is allergic to codeine, lisinopril, and hydrocodone-acetaminophen. ? ?MEDICATIONS:  ?Current Facility-Administered Medications  ?Medication Dose Route Frequency Provider Last Rate Last Admin  ? acetaminophen (TYLENOL) tablet 650 mg  650 mg Oral Q4H PRN Sandrea Boer, Acey Lav, MD      ? alum & mag hydroxide-simeth (MAALOX/MYLANTA) 200-200-20 MG/5ML suspension 60 mL  60 mL Oral Q4H PRN Ventura Sellers, MD      ? cefadroxil (DURICEF) capsule 1,000 mg  1,000 mg Oral BID Ventura Sellers, MD   1,000 mg at  03/11/22 0945  ? Chlorhexidine Gluconate Cloth 2 % PADS 6 each  6 each Topical Daily Ventura Sellers, MD   6 each at 03/11/22 0946  ? dexamethasone (DECADRON) tablet 4 mg  4 mg Oral Daily Ventura Sellers, MD   4 mg at 03/11/22 0946  ? enoxaparin (LOVENOX) injection 40 mg  40 mg Subcutaneous Q24H Ventura Sellers, MD   40 mg at 03/10/22 1342  ? gabapentin (NEURONTIN) capsule 900 mg  900 mg Oral 2 times per day Ventura Sellers, MD   900 mg at 03/10/22 2308  ? guaiFENesin-dextromethorphan (ROBITUSSIN DM) 100-10 MG/5ML syrup 10 mL  10 mL Oral Q4H PRN Khadijah Mastrianni, Acey Lav, MD      ? hydrocortisone (ANUSOL-HC) 2.5 % rectal cream 1 application.  1 application. Rectal BID PRN Ventura Sellers, MD      ? irbesartan (AVAPRO) tablet 150 mg  150 mg Oral QHS Ventura Sellers, MD   150 mg at 03/10/22 2307  ? methotrexate (PF) 7 g in dextrose 5 % 500 mL chemo infusion  3.5 g/m2 (Treatment Plan Recorded) Intravenous Once Ventura Sellers, MD      ? ondansetron (ZOFRAN) 16 mg, dexamethasone (DECADRON) 20 mg in sodium chloride 0.9 % 50 mL IVPB   Intravenous Once Virgina Deakins, Acey Lav, MD      ? ondansetron (ZOFRAN) tablet 4-8 mg  4-8 mg Oral Q8H PRN Ventura Sellers, MD      ? Or  ? ondansetron (ZOFRAN-ODT) disintegrating tablet 4-8 mg  4-8 mg Oral Q8H PRN Carleah Yablonski, Acey Lav, MD      ? Or  ? ondansetron (ZOFRAN) injection 4 mg  4 mg Intravenous Q8H PRN Jordon Kristiansen, Acey Lav, MD      ? Or  ? ondansetron (ZOFRAN) 8 mg in sodium chloride 0.9 % 50 mL IVPB  8 mg Intravenous Q8H PRN Tijuan Dantes, Acey Lav, MD      ? potassium chloride 10 mEq in 100 mL IVPB  10 mEq Intravenous Q1 Hr x 3 Selden Noteboom, Acey Lav, MD 100 mL/hr at 03/11/22 1108 10 mEq at 03/11/22 1108  ? senna-docusate (Senokot-S) tablet 1 tablet  1 tablet Oral QHS PRN Ventura Sellers, MD      ? sodium bicarbonate 150 mEq in dextrose 5 % 1,150 mL infusion   Intravenous Continuous Ventura Sellers, MD 125 mL/hr at 03/10/22 1638 Infusion Verify at 03/10/22 1638  ? ? ?REVIEW OF SYSTEMS:    ?Constitutional: Denies fevers, chills or abnormal weight loss ?Eyes: Denies blurriness of vision ?Ears, nose, mouth, throat, and face: Denies mucositis or sore throat ?Respiratory: Denies cough, dyspnea or wheezes ?Cardiovascular: Denies palpitation, chest discomfort or lower extremity swelling ?Gastrointestinal:  Denies nausea, constipation, diarrhea ?GU: Denies dysuria or incontinence ?Skin: Denies abnormal skin rashes ?Neurological: Per HPI ?Musculoskeletal: Denies joint pain, back or neck discomfort. No decrease in ROM ?Behavioral/Psych: Denies anxiety,  disturbance in thought content, and mood instability ? ? ?PHYSICAL EXAMINATION: ?Vitals:  ? 03/10/22 1931 03/11/22 0507  ?BP: 113/66 (!) 109/57  ?Pulse: 60 (!) 54  ?Resp:    ?Temp: 97.7 ?F (36.5 ?C) 97.9 ?F (36.6 ?C)  ?SpO2: 95% 95%  ? ?KPS: 60. ?General: Alert, cooperative, pleasant, in no acute distress ?Head: Craniotomy scar noted, dry and intact. ?EENT: No conjunctival injection or scleral icterus. Oral mucosa moist ?Lungs: Resp effort normal ?Cardiac: Regular rate and rhythm ?Abdomen: Soft, non-distended abdomen ?Skin: No rashes cyanosis or petechiae. ?Extremities: No clubbing or edema ? ?NEUROLOGIC EXAM: ?MMental Status: Awake, but drowsy, attentive to examiner only with stimulation. Oriented to self and environment. Language is impaired with regards to fluency, comprehension intact to multi step.  ?Cranial Nerves: Visual acuity is grossly normal. Visual fields are full. Extra-ocular movements intact. No ptosis. Face is symmetric ?Motor: Tone and bulk are normal. Power is noted 4/5 in right arm and leg. Reflexes are symmetric, no pathologic reflexes present.  ?Sensory: Intact to light touch ?Gait: Hemiparetic ? ? ?LABORATORY DATA:  ?I have reviewed the data as listed ?Lab Results  ?Component Value Date  ? WBC 8.7 03/11/2022  ? HGB 13.4 03/11/2022  ? HCT 41.9 03/11/2022  ? MCV 89.7 03/11/2022  ? PLT 194 03/11/2022  ? ?Recent Labs  ?  03/05/22 ?1835  03/06/22 ?0421 03/10/22 ?1218 03/11/22 ?0500  ?NA 141 139 142 140  ?K 4.1 4.2 4.2 3.2*  ?CL 104 105 106 98  ?CO2 '29 29 29 '$ 33*  ?GLUCOSE 189* 162* 127* 153*  ?BUN 24* 21 33* 22  ?CREATININE 0.75 0.55 0.63 0.6

## 2022-03-12 DIAGNOSIS — C8589 Other specified types of non-Hodgkin lymphoma, extranodal and solid organ sites: Secondary | ICD-10-CM | POA: Diagnosis not present

## 2022-03-12 LAB — CBC WITH DIFFERENTIAL/PLATELET
Abs Immature Granulocytes: 0.08 10*3/uL — ABNORMAL HIGH (ref 0.00–0.07)
Basophils Absolute: 0 10*3/uL (ref 0.0–0.1)
Basophils Relative: 0 %
Eosinophils Absolute: 0 10*3/uL (ref 0.0–0.5)
Eosinophils Relative: 0 %
HCT: 40.8 % (ref 36.0–46.0)
Hemoglobin: 13.1 g/dL (ref 12.0–15.0)
Immature Granulocytes: 1 %
Lymphocytes Relative: 15 %
Lymphs Abs: 1.2 10*3/uL (ref 0.7–4.0)
MCH: 28.7 pg (ref 26.0–34.0)
MCHC: 32.1 g/dL (ref 30.0–36.0)
MCV: 89.3 fL (ref 80.0–100.0)
Monocytes Absolute: 0.4 10*3/uL (ref 0.1–1.0)
Monocytes Relative: 5 %
Neutro Abs: 6.5 10*3/uL (ref 1.7–7.7)
Neutrophils Relative %: 79 %
Platelets: 180 10*3/uL (ref 150–400)
RBC: 4.57 MIL/uL (ref 3.87–5.11)
RDW: 13.9 % (ref 11.5–15.5)
WBC: 8.2 10*3/uL (ref 4.0–10.5)
nRBC: 0 % (ref 0.0–0.2)

## 2022-03-12 LAB — COMPREHENSIVE METABOLIC PANEL
ALT: 60 U/L — ABNORMAL HIGH (ref 0–44)
AST: 31 U/L (ref 15–41)
Albumin: 2.9 g/dL — ABNORMAL LOW (ref 3.5–5.0)
Alkaline Phosphatase: 46 U/L (ref 38–126)
Anion gap: 6 (ref 5–15)
BUN: 21 mg/dL (ref 8–23)
CO2: 37 mmol/L — ABNORMAL HIGH (ref 22–32)
Calcium: 8.1 mg/dL — ABNORMAL LOW (ref 8.9–10.3)
Chloride: 97 mmol/L — ABNORMAL LOW (ref 98–111)
Creatinine, Ser: 0.74 mg/dL (ref 0.44–1.00)
GFR, Estimated: >60 mL/min (ref >60)
Glucose, Bld: 152 mg/dL — ABNORMAL HIGH (ref 70–99)
Potassium: 4 mmol/L (ref 3.5–5.1)
Sodium: 140 mmol/L (ref 135–145)
Total Bilirubin: 1.4 mg/dL — ABNORMAL HIGH (ref 0.3–1.2)
Total Protein: 5 g/dL — ABNORMAL LOW (ref 6.5–8.1)

## 2022-03-12 LAB — URINALYSIS, ROUTINE W REFLEX MICROSCOPIC
Bacteria, UA: NONE SEEN
Bilirubin Urine: NEGATIVE
Glucose, UA: 150 mg/dL — AB
Hgb urine dipstick: NEGATIVE
Ketones, ur: NEGATIVE mg/dL
Nitrite: NEGATIVE
Protein, ur: NEGATIVE mg/dL
Specific Gravity, Urine: 1.009 (ref 1.005–1.030)
pH: 9 — ABNORMAL HIGH (ref 5.0–8.0)

## 2022-03-12 LAB — METHOTREXATE: Methotrexate: 22.05

## 2022-03-12 MED ORDER — LEUCOVORIN CALCIUM INJECTION 100 MG
15.0000 mg | Freq: Four times a day (QID) | INTRAMUSCULAR | Status: AC
  Administered 2022-03-12 – 2022-03-13 (×4): 16 mg via INTRAVENOUS
  Filled 2022-03-12 (×4): qty 0.8

## 2022-03-12 NOTE — Progress Notes (Signed)
[facility] ?Neuro-Oncology Progress Note ? ?Patient Care Team: ?Karleen Hampshire., MD as PCP - General (Internal Medicine) ? ?CHIEF COMPLAINTS/PURPOSE OF CONSULTATION:  ?Primary CNS Lymphoma ? ?Oncology History  ?CNS lymphoma ([place])  ?01/02/2022 Cancer Staging  ? Staging form: Brain and Spinal Cord, AJCC Version 9 ?- Clinical stage from 01/02/2022: WHO G4 - Signed by Ventura Sellers, MD on 01/27/2022 ?Histopathologic type: Malignant lymphoma, large B-cell, diffuse, NOS ?Stage prefix: Initial diagnosis ?Histologic grading system: 4 grade system ? ?  ?01/27/2022 Initial Diagnosis  ? CNS lymphoma ([place]) ?  ?03/11/2022 -  Chemotherapy  ? Patient is on Treatment Plan : IP NON-HODGKINS LYMPHOMA High Dose Methotrexate + Leucovorin Rescue  ?   ?03/13/2022 -  Chemotherapy  ? Patient is on Treatment Plan : NON-HODGKINS LYMPHOMA Rituximab q21d  ?   ? ? ?INTERVAL HISTORY: No complaints today, resting comfortably.  Tolerated methotrexate well yesterday. Leucovorin to initiate today, likely rituxan tomorrow. ? ?HISTORY OF PRESENTING ILLNESS:  ?Isabel Gibbs 76 y.o. female presents today for initial cycle of methotrexate induction+rituximab for primary cns lymphoma.  She describes modest improvement in cognition since treating the urinary tract infection last week.  She still has several days of oral antibiotics remaining, but currently asymptomatic.  At home she has been dosing decadron '4mg'$  daily in addition to the abx.  No other clinical changes reported. ? ? ?MEDICAL HISTORY:  ?Past Medical History:  ?Diagnosis Date  ? Hypertension   ? Prediabetes   ? 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 ? ?SURGICAL HISTORY: ?Past Surgical History:  ?Procedure Laterality Date  ? APPLICATION OF CRANIAL NAVIGATION Left 01/02/2022  ? Procedure: APPLICATION OF CRANIAL NAVIGATION;  Surgeon: Judith Part, MD;  Location: [facility];  Service: Neurosurgery;  Laterality: Left;  ? CEREBRAL MICROVASCULAR DECOMPRESSION Right 08/02/2018  ? [place]   ? FRAMELESS  BIOPSY WITH BRAINLAB Left 01/02/2022  ? Procedure: LEFT BRAIN  BIOPSY WITH Lucky Rathke;  Surgeon: Judith Part, MD;  Location: [place];  Service: Neurosurgery;  Laterality: Left;  ? IR IMAGING GUIDED PORT INSERTION  03/04/2022  ? RADIOLOGY WITH ANESTHESIA N/A 01/01/2022  ? Procedure: MRI of Brain with and without contrast;  Surgeon: Radiologist, Medication, MD;  Location: [facility];  Service: Radiology;  Laterality: N/A;  ? ? ?SOCIAL HISTORY: ?Social History  ? ?Socioeconomic History  ? Marital status: Widowed  ?  Spouse name: Not on file  ? Number of children: Not on file  ? Years of education: Not on file  ? Highest education level: Not on file  ?Occupational History  ? Not on file  ?Tobacco Use  ? Smoking status: Former  ?  Types: Cigarettes  ?  Quit date: 2000  ?  Years since quitting: 23.2  ? Smokeless tobacco: Never  ?Substance and Sexual Activity  ? Alcohol use: Not on file  ? Drug use: Not on file  ? Sexual activity: Not on file  ?Other Topics Concern  ? Not on file  ?Social History Narrative  ? Not on file  ? ?Social Determinants of Health  ? ?Financial Resource Strain: Not on file  ?Food Insecurity: Not on file  ?Transportation Needs: Not on file  ?Physical Activity: Not on file  ?Stress: Not on file  ?Social Connections: Not on file  ?Intimate Partner Violence: Not on file  ? ? ?FAMILY HISTORY: ?Family History  ?Problem Relation Age of Onset  ? Heart failure Brother   ? Diabetes Brother   ?  Yves Dill Parkinson White syndrome Son   ? ? ?ALLERGIES:  is allergic to codeine, lisinopril, and hydrocodone-acetaminophen. ? ?MEDICATIONS:  ?Current Facility-Administered Medications  ?Medication Dose Route Frequency Provider Last Rate Last Admin  ? acetaminophen (TYLENOL) tablet 650 mg  650 mg Oral Q4H PRN Keara Pagliarulo, Acey Lav, MD      ? alum & mag hydroxide-simeth (MAALOX/MYLANTA) 200-200-20 MG/5ML suspension 60 mL  60 mL Oral Q4H PRN Ventura Sellers, MD      ? cefadroxil (DURICEF) capsule 1,000 mg  1,000 mg Oral  BID Ventura Sellers, MD   1,000 mg at 03/12/22 1031  ? Chlorhexidine Gluconate Cloth 2 % PADS 6 each  6 each Topical Daily Ventura Sellers, MD   6 each at 03/12/22 1032  ? dexamethasone (DECADRON) tablet 4 mg  4 mg Oral Daily Ventura Sellers, MD   4 mg at 03/12/22 1032  ? enoxaparin (LOVENOX) injection 40 mg  40 mg Subcutaneous Q24H Ventura Sellers, MD   40 mg at 03/12/22 1503  ? gabapentin (NEURONTIN) capsule 900 mg  900 mg Oral 2 times per day Ventura Sellers, MD   900 mg at 03/11/22 2203  ? guaiFENesin-dextromethorphan (ROBITUSSIN DM) 100-10 MG/5ML syrup 10 mL  10 mL Oral Q4H PRN Hilliard Borges, Acey Lav, MD      ? hydrocortisone (ANUSOL-HC) 2.5 % rectal cream 1 application.  1 application. Rectal BID PRN Ventura Sellers, MD      ? irbesartan (AVAPRO) tablet 150 mg  150 mg Oral QHS Ventura Sellers, MD   150 mg at 03/11/22 2156  ? leucovorin injection 16 mg  16 mg Intravenous Q6H Ventura Sellers, MD   16 mg at 03/12/22 1459  ? ondansetron (ZOFRAN) tablet 4-8 mg  4-8 mg Oral Q8H PRN Georgios Kina, Acey Lav, MD      ? Or  ? ondansetron (ZOFRAN-ODT) disintegrating tablet 4-8 mg  4-8 mg Oral Q8H PRN Monay Houlton, Acey Lav, MD      ? Or  ? ondansetron (ZOFRAN) injection 4 mg  4 mg Intravenous Q8H PRN Yocelyn Brocious, Acey Lav, MD      ? Or  ? ondansetron (ZOFRAN) 8 mg in sodium chloride 0.9 % 50 mL IVPB  8 mg Intravenous Q8H PRN Aven Christen, Acey Lav, MD      ? senna-docusate (Senokot-S) tablet 1 tablet  1 tablet Oral QHS PRN Ventura Sellers, MD      ? sodium bicarbonate 150 mEq in dextrose 5 % 1,150 mL infusion   Intravenous Continuous Ventura Sellers, MD 75 mL/hr at 03/12/22 1142 New Bag at 03/12/22 1142  ? ? ?REVIEW OF SYSTEMS:   ?Constitutional: Denies fevers, chills or abnormal weight loss ?Eyes: Denies blurriness of vision ?Ears, nose, mouth, throat, and face: Denies mucositis or sore throat ?Respiratory: Denies cough, dyspnea or wheezes ?Cardiovascular: Denies palpitation, chest discomfort or lower extremity  swelling ?Gastrointestinal:  Denies nausea, constipation, diarrhea ?GU: Denies dysuria or incontinence ?Skin: Denies abnormal skin rashes ?Neurological: Per HPI ?Musculoskeletal: Denies joint pain, back or neck discomfort. No decrease in ROM ?Behavioral/Psych: Denies anxiety, disturbance in thought content, and mood instability ? ? ?PHYSICAL EXAMINATION: ?Vitals:  ? 03/12/22 0528 03/12/22 1406  ?BP: (!) 106/50 122/66  ?Pulse: (!) 47 62  ?Resp: 17 18  ?Temp: (!) 97.4 ?F (36.3 ?C) 97.8 ?F (36.6 ?C)  ?SpO2: 92% 95%  ? ?KPS: 60. ?General: Alert, cooperative, pleasant, in no acute distress ?Head: Craniotomy scar noted, dry and intact. ?EENT: No conjunctival  injection or scleral icterus. Oral mucosa moist ?Lungs: Resp effort normal ?Cardiac: Regular rate and rhythm ?Abdomen: Soft, non-distended abdomen ?Skin: No rashes cyanosis or petechiae. ?Extremities: No clubbing or edema ? ?NEUROLOGIC EXAM: ?MMental Status: Awake, but drowsy, attentive to examiner only with stimulation. Oriented to self and environment. Language is impaired with regards to fluency, comprehension intact to multi step.  ?Cranial Nerves: Visual acuity is grossly normal. Visual fields are full. Extra-ocular movements intact. No ptosis. Face is symmetric ?Motor: Tone and bulk are normal. Power is noted 4/5 in right arm and leg. Reflexes are symmetric, no pathologic reflexes present.  ?Sensory: Intact to light touch ?Gait: Hemiparetic ? ? ?LABORATORY DATA:  ?I have reviewed the data as listed ?Lab Results  ?Component Value Date  ? WBC 8.2 03/12/2022  ? HGB 13.1 03/12/2022  ? HCT 40.8 03/12/2022  ? MCV 89.3 03/12/2022  ? PLT 180 03/12/2022  ? ?Recent Labs  ?  03/10/22 ?1218 03/11/22 ?0500 03/12/22 ?5329  ?NA 142 140 140  ?K 4.2 3.2* 4.0  ?CL 106 98 97*  ?CO2 29 33* 37*  ?GLUCOSE 127* 153* 152*  ?BUN 33* 22 21  ?CREATININE 0.63 0.62 0.74  ?CALCIUM 8.5* 8.0* 8.1*  ?GFRNONAA >60 >60 >60  ?PROT 5.5* 4.7* 5.0*  ?ALBUMIN 3.1* 2.7* 2.9*  ?AST '17 17 31  '$ ?ALT 38 32  60*  ?ALKPHOS 44 44 46  ?BILITOT 0.5 0.7 1.4*  ? ? ?RADIOGRAPHIC STUDIES: ?I have personally reviewed the radiological images as listed and agreed with the findings in the report. ?CT Head Wo Contrast ? ?Result Da

## 2022-03-12 NOTE — Progress Notes (Addendum)
PHARMACY MONITORING NOTE ? ?Wendy Hoback is admitted for high-dose methotrexate on 03/11/22.  ? ?Methotrexate level drawn ~20 hours after infusion started resulted at 22 (results faxed to [facility]). Results reported to MD - verbal orders to continue leucovorin '16mg'$  IV q6 hours x 4 doses as previously ordered.  ? ?Dimple Nanas, PharmD ?03/12/2022 2:43 PM ? ?

## 2022-03-13 LAB — COMPREHENSIVE METABOLIC PANEL
ALT: 64 U/L — ABNORMAL HIGH (ref 0–44)
AST: 26 U/L (ref 15–41)
Albumin: 2.7 g/dL — ABNORMAL LOW (ref 3.5–5.0)
Alkaline Phosphatase: 40 U/L (ref 38–126)
Anion gap: 6 (ref 5–15)
BUN: 24 mg/dL — ABNORMAL HIGH (ref 8–23)
CO2: 36 mmol/L — ABNORMAL HIGH (ref 22–32)
Calcium: 8.5 mg/dL — ABNORMAL LOW (ref 8.9–10.3)
Chloride: 98 mmol/L (ref 98–111)
Creatinine, Ser: 0.85 mg/dL (ref 0.44–1.00)
GFR, Estimated: >60 mL/min (ref >60)
Glucose, Bld: 138 mg/dL — ABNORMAL HIGH (ref 70–99)
Potassium: 3.8 mmol/L (ref 3.5–5.1)
Sodium: 140 mmol/L (ref 135–145)
Total Bilirubin: 0.9 mg/dL (ref 0.3–1.2)
Total Protein: 4.7 g/dL — ABNORMAL LOW (ref 6.5–8.1)

## 2022-03-13 LAB — CBC WITH DIFFERENTIAL/PLATELET
Abs Immature Granulocytes: 0.09 10*3/uL — ABNORMAL HIGH (ref 0.00–0.07)
Basophils Absolute: 0 10*3/uL (ref 0.0–0.1)
Basophils Relative: 0 %
Eosinophils Absolute: 0 10*3/uL (ref 0.0–0.5)
Eosinophils Relative: 0 %
HCT: 39.9 % (ref 36.0–46.0)
Hemoglobin: 12.7 g/dL (ref 12.0–15.0)
Immature Granulocytes: 1 %
Lymphocytes Relative: 19 %
Lymphs Abs: 1.6 10*3/uL (ref 0.7–4.0)
MCH: 29 pg (ref 26.0–34.0)
MCHC: 31.8 g/dL (ref 30.0–36.0)
MCV: 91.1 fL (ref 80.0–100.0)
Monocytes Absolute: 0.5 10*3/uL (ref 0.1–1.0)
Monocytes Relative: 6 %
Neutro Abs: 6.3 10*3/uL (ref 1.7–7.7)
Neutrophils Relative %: 74 %
Platelets: 176 10*3/uL (ref 150–400)
RBC: 4.38 MIL/uL (ref 3.87–5.11)
RDW: 14.3 % (ref 11.5–15.5)
WBC: 8.6 10*3/uL (ref 4.0–10.5)
nRBC: 0 % (ref 0.0–0.2)

## 2022-03-13 LAB — URINALYSIS, ROUTINE W REFLEX MICROSCOPIC
Bilirubin Urine: NEGATIVE
Glucose, UA: NEGATIVE mg/dL
Ketones, ur: NEGATIVE mg/dL
Nitrite: NEGATIVE
Protein, ur: NEGATIVE mg/dL
Specific Gravity, Urine: 1.009 (ref 1.005–1.030)
pH: 9 — ABNORMAL HIGH (ref 5.0–8.0)

## 2022-03-13 LAB — METHOTREXATE: Methotrexate: 1.4

## 2022-03-13 MED ORDER — LEUCOVORIN CALCIUM INJECTION 100 MG
15.0000 mg | Freq: Four times a day (QID) | INTRAMUSCULAR | Status: DC
  Filled 2022-03-13 (×2): qty 0.8

## 2022-03-13 MED ORDER — SODIUM CHLORIDE 0.9 % IV SOLN
375.0000 mg/m2 | Freq: Once | INTRAVENOUS | Status: AC
  Administered 2022-03-13: 800 mg via INTRAVENOUS
  Filled 2022-03-13: qty 30

## 2022-03-13 MED ORDER — ACETAMINOPHEN 325 MG PO TABS
650.0000 mg | ORAL_TABLET | Freq: Once | ORAL | Status: AC
  Administered 2022-03-13: 650 mg via ORAL
  Filled 2022-03-13: qty 2

## 2022-03-13 MED ORDER — DIPHENHYDRAMINE HCL 25 MG PO CAPS
50.0000 mg | ORAL_CAPSULE | Freq: Once | ORAL | Status: AC
  Administered 2022-03-13: 50 mg via ORAL
  Filled 2022-03-13: qty 2

## 2022-03-13 MED ORDER — LEUCOVORIN CALCIUM INJECTION 100 MG
15.0000 mg | Freq: Four times a day (QID) | INTRAMUSCULAR | Status: AC
  Administered 2022-03-13 – 2022-03-14 (×4): 16 mg via INTRAVENOUS
  Filled 2022-03-13 (×6): qty 0.8

## 2022-03-13 MED ORDER — SODIUM CHLORIDE 0.9 % IV SOLN
Freq: Once | INTRAVENOUS | Status: AC
  Administered 2022-03-13: 36 mg via INTRAVENOUS
  Filled 2022-03-13: qty 8

## 2022-03-13 NOTE — Progress Notes (Signed)
[place] ?Neuro-Oncology Progress Note ? ?Patient Care Team: ?Karleen Hampshire., MD as PCP - General (Internal Medicine) ? ?CHIEF COMPLAINTS/PURPOSE OF CONSULTATION:  ?Primary CNS Lymphoma ? ?Oncology History  ?CNS lymphoma ([place])  ?01/02/2022 Cancer Staging  ? Staging form: Brain and Spinal Cord, AJCC Version 9 ?- Clinical stage from 01/02/2022: WHO G4 - Signed by Ventura Sellers, MD on 01/27/2022 ?Histopathologic type: Malignant lymphoma, large B-cell, diffuse, NOS ?Stage prefix: Initial diagnosis ?Histologic grading system: 4 grade system ? ?  ?01/27/2022 Initial Diagnosis  ? CNS lymphoma ([place]) ?  ?03/11/2022 -  Chemotherapy  ? Patient is on Treatment Plan : IP NON-HODGKINS LYMPHOMA High Dose Methotrexate + Leucovorin Rescue  ?   ?03/13/2022 -  Chemotherapy  ? Patient is on Treatment Plan : NON-HODGKINS LYMPHOMA Rituximab q21d  ?   ? ? ?INTERVAL HISTORY: No complaints today, resting comfortably.  No significant issues with rituximab today, drowsy from the benadryl.  Leucovorin day 2. ? ?HISTORY OF PRESENTING ILLNESS:  ?Isabel Gibbs 76 y.o. female presents today for initial cycle of methotrexate induction+rituximab for primary cns lymphoma.  She describes modest improvement in cognition since treating the urinary tract infection last week.  She still has several days of oral antibiotics remaining, but currently asymptomatic.  At home she has been dosing decadron '4mg'$  daily in addition to the abx.  No other clinical changes reported. ? ? ?MEDICAL HISTORY:  ?Past Medical History:  ?Diagnosis Date  ? Hypertension   ? Prediabetes   ? 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 ? ?SURGICAL HISTORY: ?Past Surgical History:  ?Procedure Laterality Date  ? APPLICATION OF CRANIAL NAVIGATION Left 01/02/2022  ? Procedure: APPLICATION OF CRANIAL NAVIGATION;  Surgeon: Judith Part, MD;  Location: [facility];  Service: Neurosurgery;  Laterality: Left;  ? CEREBRAL MICROVASCULAR DECOMPRESSION Right 08/02/2018  ? [place]  ?  FRAMELESS  BIOPSY WITH BRAINLAB Left 01/02/2022  ? Procedure: LEFT BRAIN  BIOPSY WITH Lucky Rathke;  Surgeon: Judith Part, MD;  Location: [facility];  Service: Neurosurgery;  Laterality: Left;  ? IR IMAGING GUIDED PORT INSERTION  03/04/2022  ? RADIOLOGY WITH ANESTHESIA N/A 01/01/2022  ? Procedure: MRI of Brain with and without contrast;  Surgeon: Radiologist, Medication, MD;  Location: [facility];  Service: Radiology;  Laterality: N/A;  ? ? ?SOCIAL HISTORY: ?Social History  ? ?Socioeconomic History  ? Marital status: Widowed  ?  Spouse name: Not on file  ? Number of children: Not on file  ? Years of education: Not on file  ? Highest education level: Not on file  ?Occupational History  ? Not on file  ?Tobacco Use  ? Smoking status: Former  ?  Types: Cigarettes  ?  Quit date: 2000  ?  Years since quitting: 23.2  ? Smokeless tobacco: Never  ?Substance and Sexual Activity  ? Alcohol use: Not on file  ? Drug use: Not on file  ? Sexual activity: Not on file  ?Other Topics Concern  ? Not on file  ?Social History Narrative  ? Not on file  ? ?Social Determinants of Health  ? ?Financial Resource Strain: Not on file  ?Food Insecurity: Not on file  ?Transportation Needs: Not on file  ?Physical Activity: Not on file  ?Stress: Not on file  ?Social Connections: Not on file  ?Intimate Partner Violence: Not on file  ? ? ?FAMILY HISTORY: ?Family History  ?Problem Relation Age of Onset  ? Heart failure Brother   ?  Diabetes Brother   ? Yves Dill Parkinson White syndrome Son   ? ? ?ALLERGIES:  is allergic to codeine, lisinopril, and hydrocodone-acetaminophen. ? ?MEDICATIONS:  ?Current Facility-Administered Medications  ?Medication Dose Route Frequency Provider Last Rate Last Admin  ? acetaminophen (TYLENOL) tablet 650 mg  650 mg Oral Q4H PRN Baeleigh Devincent, Acey Lav, MD      ? alum & mag hydroxide-simeth (MAALOX/MYLANTA) 200-200-20 MG/5ML suspension 60 mL  60 mL Oral Q4H PRN Ventura Sellers, MD      ? cefadroxil (DURICEF) capsule 1,000 mg  1,000 mg Oral BID  Ventura Sellers, MD   1,000 mg at 03/13/22 0848  ? Chlorhexidine Gluconate Cloth 2 % PADS 6 each  6 each Topical Daily Ventura Sellers, MD   6 each at 03/13/22 0849  ? dexamethasone (DECADRON) tablet 4 mg  4 mg Oral Daily Ventura Sellers, MD   4 mg at 03/13/22 0848  ? enoxaparin (LOVENOX) injection 40 mg  40 mg Subcutaneous Q24H Ventura Sellers, MD   40 mg at 03/13/22 1503  ? gabapentin (NEURONTIN) capsule 900 mg  900 mg Oral 2 times per day Ventura Sellers, MD   900 mg at 03/12/22 2122  ? guaiFENesin-dextromethorphan (ROBITUSSIN DM) 100-10 MG/5ML syrup 10 mL  10 mL Oral Q4H PRN Shaquanta Harkless, Acey Lav, MD      ? hydrocortisone (ANUSOL-HC) 2.5 % rectal cream 1 application.  1 application. Rectal BID PRN Ventura Sellers, MD      ? irbesartan (AVAPRO) tablet 150 mg  150 mg Oral QHS Ventura Sellers, MD   150 mg at 03/12/22 2123  ? leucovorin injection 16 mg  16 mg Intravenous Q6H Ventura Sellers, MD   16 mg at 03/13/22 1502  ? ondansetron (ZOFRAN) tablet 4-8 mg  4-8 mg Oral Q8H PRN Natalya Domzalski, Acey Lav, MD      ? Or  ? ondansetron (ZOFRAN-ODT) disintegrating tablet 4-8 mg  4-8 mg Oral Q8H PRN Kyreese Chio, Acey Lav, MD      ? Or  ? ondansetron (ZOFRAN) injection 4 mg  4 mg Intravenous Q8H PRN Braylon Grenda, Acey Lav, MD      ? Or  ? ondansetron (ZOFRAN) 8 mg in sodium chloride 0.9 % 50 mL IVPB  8 mg Intravenous Q8H PRN Brigida Scotti, Acey Lav, MD      ? senna-docusate (Senokot-S) tablet 1 tablet  1 tablet Oral QHS PRN Ventura Sellers, MD      ? sodium bicarbonate 150 mEq in dextrose 5 % 1,150 mL infusion   Intravenous Continuous Ventura Sellers, MD 50 mL/hr at 03/13/22 1502 Rate Change at 03/13/22 1502  ? ? ?REVIEW OF SYSTEMS:   ?Constitutional: Denies fevers, chills or abnormal weight loss ?Eyes: Denies blurriness of vision ?Ears, nose, mouth, throat, and face: Denies mucositis or sore throat ?Respiratory: Denies cough, dyspnea or wheezes ?Cardiovascular: Denies palpitation, chest discomfort or lower extremity  swelling ?Gastrointestinal:  Denies nausea, constipation, diarrhea ?GU: Denies dysuria or incontinence ?Skin: Denies abnormal skin rashes ?Neurological: Per HPI ?Musculoskeletal: Denies joint pain, back or neck discomfort. No decrease in ROM ?Behavioral/Psych: Denies anxiety, disturbance in thought content, and mood instability ? ? ?PHYSICAL EXAMINATION: ?Vitals:  ? 03/13/22 1243 03/13/22 1423  ?BP: (!) 103/45 102/60  ?Pulse: (!) 58 (!) 57  ?Resp: 16 18  ?Temp: 98.1 ?F (36.7 ?C) 97.8 ?F (36.6 ?C)  ?SpO2: 94% 91%  ? ?KPS: 60. ?General: Alert, cooperative, pleasant, in no acute distress ?Head: Craniotomy scar noted, dry  and intact. ?EENT: No conjunctival injection or scleral icterus. Oral mucosa moist ?Lungs: Resp effort normal ?Cardiac: Regular rate and rhythm ?Abdomen: Soft, non-distended abdomen ?Skin: No rashes cyanosis or petechiae. ?Extremities: No clubbing or edema ? ?NEUROLOGIC EXAM: ?MMental Status: Awake, but drowsy, attentive to examiner only with stimulation. Oriented to self and environment. Language is impaired with regards to fluency, comprehension intact to multi step.  ?Cranial Nerves: Visual acuity is grossly normal. Visual fields are full. Extra-ocular movements intact. No ptosis. Face is symmetric ?Motor: Tone and bulk are normal. Power is noted 4/5 in right arm and leg. Reflexes are symmetric, no pathologic reflexes present.  ?Sensory: Intact to light touch ?Gait: Hemiparetic ? ? ?LABORATORY DATA:  ?I have reviewed the data as listed ?Lab Results  ?Component Value Date  ? WBC 8.6 03/13/2022  ? HGB 12.7 03/13/2022  ? HCT 39.9 03/13/2022  ? MCV 91.1 03/13/2022  ? PLT 176 03/13/2022  ? ?Recent Labs  ?  03/11/22 ?0500 03/12/22 ?8916 03/13/22 ?0459  ?NA 140 140 140  ?K 3.2* 4.0 3.8  ?CL 98 97* 98  ?CO2 33* 37* 36*  ?GLUCOSE 153* 152* 138*  ?BUN 22 21 24*  ?CREATININE 0.62 0.74 0.85  ?CALCIUM 8.0* 8.1* 8.5*  ?GFRNONAA >60 >60 >60  ?PROT 4.7* 5.0* 4.7*  ?ALBUMIN 2.7* 2.9* 2.7*  ?AST '17 31 26  '$ ?ALT 32 60*  64*  ?ALKPHOS 44 46 40  ?BILITOT 0.7 1.4* 0.9  ? ? ?RADIOGRAPHIC STUDIES: ?I have personally reviewed the radiological images as listed and agreed with the findings in the report. ?CT Head Wo Contrast ? ?Result Da

## 2022-03-14 ENCOUNTER — Encounter: Payer: Self-pay | Admitting: Internal Medicine

## 2022-03-14 LAB — COMPREHENSIVE METABOLIC PANEL
ALT: 122 U/L — ABNORMAL HIGH (ref 0–44)
AST: 57 U/L — ABNORMAL HIGH (ref 15–41)
Albumin: 2.7 g/dL — ABNORMAL LOW (ref 3.5–5.0)
Alkaline Phosphatase: 42 U/L (ref 38–126)
Anion gap: 6 (ref 5–15)
BUN: 31 mg/dL — ABNORMAL HIGH (ref 8–23)
CO2: 35 mmol/L — ABNORMAL HIGH (ref 22–32)
Calcium: 8.1 mg/dL — ABNORMAL LOW (ref 8.9–10.3)
Chloride: 100 mmol/L (ref 98–111)
Creatinine, Ser: 0.86 mg/dL (ref 0.44–1.00)
GFR, Estimated: >60 mL/min (ref >60)
Glucose, Bld: 152 mg/dL — ABNORMAL HIGH (ref 70–99)
Potassium: 3.6 mmol/L (ref 3.5–5.1)
Sodium: 141 mmol/L (ref 135–145)
Total Bilirubin: 0.7 mg/dL (ref 0.3–1.2)
Total Protein: 4.8 g/dL — ABNORMAL LOW (ref 6.5–8.1)

## 2022-03-14 LAB — CBC WITH DIFFERENTIAL/PLATELET
Abs Immature Granulocytes: 0.07 10*3/uL (ref 0.00–0.07)
Basophils Absolute: 0 10*3/uL (ref 0.0–0.1)
Basophils Relative: 0 %
Eosinophils Absolute: 0 10*3/uL (ref 0.0–0.5)
Eosinophils Relative: 0 %
HCT: 41.2 % (ref 36.0–46.0)
Hemoglobin: 12.9 g/dL (ref 12.0–15.0)
Immature Granulocytes: 1 %
Lymphocytes Relative: 6 %
Lymphs Abs: 0.5 10*3/uL — ABNORMAL LOW (ref 0.7–4.0)
MCH: 28.5 pg (ref 26.0–34.0)
MCHC: 31.3 g/dL (ref 30.0–36.0)
MCV: 91.2 fL (ref 80.0–100.0)
Monocytes Absolute: 0.2 10*3/uL (ref 0.1–1.0)
Monocytes Relative: 2 %
Neutro Abs: 7.2 10*3/uL (ref 1.7–7.7)
Neutrophils Relative %: 91 %
Platelets: 197 10*3/uL (ref 150–400)
RBC: 4.52 MIL/uL (ref 3.87–5.11)
RDW: 14.4 % (ref 11.5–15.5)
WBC: 7.9 10*3/uL (ref 4.0–10.5)
nRBC: 0 % (ref 0.0–0.2)

## 2022-03-14 LAB — URINALYSIS, COMPLETE (UACMP) WITH MICROSCOPIC
Bilirubin Urine: NEGATIVE
Glucose, UA: NEGATIVE mg/dL
Hgb urine dipstick: NEGATIVE
Ketones, ur: NEGATIVE mg/dL
Nitrite: NEGATIVE
Protein, ur: 30 mg/dL — AB
Specific Gravity, Urine: 1.02 (ref 1.005–1.030)
pH: 7 (ref 5.0–8.0)

## 2022-03-14 LAB — METHOTREXATE: Methotrexate: 0.2

## 2022-03-14 MED ORDER — LEUCOVORIN CALCIUM INJECTION 100 MG
15.0000 mg | Freq: Four times a day (QID) | INTRAMUSCULAR | Status: AC
  Administered 2022-03-14 – 2022-03-15 (×4): 16 mg via INTRAVENOUS
  Filled 2022-03-14 (×4): qty 0.8

## 2022-03-14 NOTE — Care Management Important Message (Signed)
Important Message ? ?Patient Details IM Letter given to the Patient. ?Name: Isabel Gibbs ?MRN: [id number] ?Date of Birth: Apr 28, 1946 ? ? ?Medicare Important Message Given:  Yes ? ? ? ? ?Kerin Salen ?03/14/2022, 9:48 AM ?

## 2022-03-14 NOTE — Progress Notes (Signed)
[place] ?Neuro-Oncology Progress Note ? ?Patient Care Team: ?Karleen Hampshire., MD as PCP - General (Internal Medicine) ? ?CHIEF COMPLAINTS/PURPOSE OF CONSULTATION:  ?Primary CNS Lymphoma ? ?Oncology History  ?CNS lymphoma ([place])  ?01/02/2022 Cancer Staging  ? Staging form: Brain and Spinal Cord, AJCC Version 9 ?- Clinical stage from 01/02/2022: WHO G4 - Signed by Ventura Sellers, MD on 01/27/2022 ?Histopathologic type: Malignant lymphoma, large B-cell, diffuse, NOS ?Stage prefix: Initial diagnosis ?Histologic grading system: 4 grade system ? ?  ?01/27/2022 Initial Diagnosis  ? CNS lymphoma ([place]) ?  ?03/11/2022 -  Chemotherapy  ? Patient is on Treatment Plan : IP NON-HODGKINS LYMPHOMA High Dose Methotrexate + Leucovorin Rescue  ?   ?03/13/2022 -  Chemotherapy  ? Patient is on Treatment Plan : NON-HODGKINS LYMPHOMA Rituximab q21d  ?   ? ? ?INTERVAL HISTORY: No complaints today, resting comfortably.  Third day of leucovorin.  Denies urinary symptoms. ? ?HISTORY OF PRESENTING ILLNESS:  ?Isabel Gibbs 76 y.o. female presents today for initial cycle of methotrexate induction+rituximab for primary cns lymphoma.  She describes modest improvement in cognition since treating the urinary tract infection last week.  She still has several days of oral antibiotics remaining, but currently asymptomatic.  At home she has been dosing decadron '4mg'$  daily in addition to the abx.  No other clinical changes reported. ? ? ?MEDICAL HISTORY:  ?Past Medical History:  ?Diagnosis Date  ? Hypertension   ? Prediabetes   ? 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 ? ?SURGICAL HISTORY: ?Past Surgical History:  ?Procedure Laterality Date  ? APPLICATION OF CRANIAL NAVIGATION Left 01/02/2022  ? Procedure: APPLICATION OF CRANIAL NAVIGATION;  Surgeon: Judith Part, MD;  Location: [facility];  Service: Neurosurgery;  Laterality: Left;  ? CEREBRAL MICROVASCULAR DECOMPRESSION Right 08/02/2018  ? [place]  ? FRAMELESS  BIOPSY WITH BRAINLAB Left  01/02/2022  ? Procedure: LEFT BRAIN  BIOPSY WITH Lucky Rathke;  Surgeon: Judith Part, MD;  Location: [facility];  Service: Neurosurgery;  Laterality: Left;  ? IR IMAGING GUIDED PORT INSERTION  03/04/2022  ? RADIOLOGY WITH ANESTHESIA N/A 01/01/2022  ? Procedure: MRI of Brain with and without contrast;  Surgeon: Radiologist, Medication, MD;  Location: [facility];  Service: Radiology;  Laterality: N/A;  ? ? ?SOCIAL HISTORY: ?Social History  ? ?Socioeconomic History  ? Marital status: Widowed  ?  Spouse name: Not on file  ? Number of children: Not on file  ? Years of education: Not on file  ? Highest education level: Not on file  ?Occupational History  ? Not on file  ?Tobacco Use  ? Smoking status: Former  ?  Types: Cigarettes  ?  Quit date: 2000  ?  Years since quitting: 23.2  ? Smokeless tobacco: Never  ?Substance and Sexual Activity  ? Alcohol use: Not on file  ? Drug use: Not on file  ? Sexual activity: Not on file  ?Other Topics Concern  ? Not on file  ?Social History Narrative  ? Not on file  ? ?Social Determinants of Health  ? ?Financial Resource Strain: Not on file  ?Food Insecurity: Not on file  ?Transportation Needs: Not on file  ?Physical Activity: Not on file  ?Stress: Not on file  ?Social Connections: Not on file  ?Intimate Partner Violence: Not on file  ? ? ?FAMILY HISTORY: ?Family History  ?Problem Relation Age of Onset  ? Heart failure Brother   ? Diabetes Brother   ? Yves Dill  Parkinson White syndrome Son   ? ? ?ALLERGIES:  is allergic to codeine, lisinopril, and hydrocodone-acetaminophen. ? ?MEDICATIONS:  ?Current Facility-Administered Medications  ?Medication Dose Route Frequency Provider Last Rate Last Admin  ? acetaminophen (TYLENOL) tablet 650 mg  650 mg Oral Q4H PRN Hasana Alcorta, Acey Lav, MD      ? alum & mag hydroxide-simeth (MAALOX/MYLANTA) 200-200-20 MG/5ML suspension 60 mL  60 mL Oral Q4H PRN Ventura Sellers, MD      ? cefadroxil (DURICEF) capsule 1,000 mg  1,000 mg Oral BID Ventura Sellers, MD   1,000 mg at  03/14/22 0854  ? Chlorhexidine Gluconate Cloth 2 % PADS 6 each  6 each Topical Daily Ventura Sellers, MD   6 each at 03/14/22 [id number]  ? dexamethasone (DECADRON) tablet 4 mg  4 mg Oral Daily Ventura Sellers, MD   4 mg at 03/14/22 0855  ? enoxaparin (LOVENOX) injection 40 mg  40 mg Subcutaneous Q24H Ventura Sellers, MD   40 mg at 03/14/22 1541  ? gabapentin (NEURONTIN) capsule 900 mg  900 mg Oral 2 times per day Ventura Sellers, MD   900 mg at 03/14/22 1542  ? guaiFENesin-dextromethorphan (ROBITUSSIN DM) 100-10 MG/5ML syrup 10 mL  10 mL Oral Q4H PRN Shantell Belongia, Acey Lav, MD      ? hydrocortisone (ANUSOL-HC) 2.5 % rectal cream 1 application.  1 application. Rectal BID PRN Ventura Sellers, MD      ? irbesartan (AVAPRO) tablet 150 mg  150 mg Oral QHS Ventura Sellers, MD   150 mg at 03/13/22 2212  ? leucovorin injection 16 mg  16 mg Intravenous Q6H Ventura Sellers, MD   16 mg at 03/14/22 1541  ? ondansetron (ZOFRAN) tablet 4-8 mg  4-8 mg Oral Q8H PRN Rosalia Mcavoy, Acey Lav, MD      ? Or  ? ondansetron (ZOFRAN-ODT) disintegrating tablet 4-8 mg  4-8 mg Oral Q8H PRN Ikechukwu Cerny, Acey Lav, MD      ? Or  ? ondansetron (ZOFRAN) injection 4 mg  4 mg Intravenous Q8H PRN Baran Kuhrt, Acey Lav, MD      ? Or  ? ondansetron (ZOFRAN) 8 mg in sodium chloride 0.9 % 50 mL IVPB  8 mg Intravenous Q8H PRN Kamil Hanigan, Acey Lav, MD      ? senna-docusate (Senokot-S) tablet 1 tablet  1 tablet Oral QHS PRN Ventura Sellers, MD      ? sodium bicarbonate 150 mEq in dextrose 5 % 1,150 mL infusion   Intravenous Continuous Ventura Sellers, MD 75 mL/hr at 03/14/22 1635 Rate Change at 03/14/22 1635  ? ? ?REVIEW OF SYSTEMS:   ?Constitutional: Denies fevers, chills or abnormal weight loss ?Eyes: Denies blurriness of vision ?Ears, nose, mouth, throat, and face: Denies mucositis or sore throat ?Respiratory: Denies cough, dyspnea or wheezes ?Cardiovascular: Denies palpitation, chest discomfort or lower extremity swelling ?Gastrointestinal:  Denies nausea,  constipation, diarrhea ?GU: Denies dysuria or incontinence ?Skin: Denies abnormal skin rashes ?Neurological: Per HPI ?Musculoskeletal: Denies joint pain, back or neck discomfort. No decrease in ROM ?Behavioral/Psych: Denies anxiety, disturbance in thought content, and mood instability ? ? ?PHYSICAL EXAMINATION: ?Vitals:  ? 03/14/22 0507 03/14/22 1331  ?BP: 114/77 117/66  ?Pulse: 72 61  ?Resp: 14 18  ?Temp: (!) 97.5 ?F (36.4 ?C) 98.1 ?F (36.7 ?C)  ?SpO2: 95% 98%  ? ?KPS: 60. ?General: Alert, cooperative, pleasant, in no acute distress ?Head: Craniotomy scar noted, dry and intact. ?EENT: No conjunctival injection or scleral  icterus. Oral mucosa moist ?Lungs: Resp effort normal ?Cardiac: Regular rate and rhythm ?Abdomen: Soft, non-distended abdomen ?Skin: No rashes cyanosis or petechiae. ?Extremities: No clubbing or edema ? ?NEUROLOGIC EXAM: ?MMental Status: Awake, but drowsy, attentive to examiner only with stimulation. Oriented to self and environment. Language is impaired with regards to fluency, comprehension intact to multi step.  ?Cranial Nerves: Visual acuity is grossly normal. Visual fields are full. Extra-ocular movements intact. No ptosis. Face is symmetric ?Motor: Tone and bulk are normal. Power is noted 4/5 in right arm and leg. Reflexes are symmetric, no pathologic reflexes present.  ?Sensory: Intact to light touch ?Gait: Hemiparetic ? ? ?LABORATORY DATA:  ?I have reviewed the data as listed ?Lab Results  ?Component Value Date  ? WBC 7.9 03/14/2022  ? HGB 12.9 03/14/2022  ? HCT 41.2 03/14/2022  ? MCV 91.2 03/14/2022  ? PLT 197 03/14/2022  ? ?Recent Labs  ?  03/12/22 ?9242 03/13/22 ?0459 03/14/22 ?0512  ?NA 140 140 141  ?K 4.0 3.8 3.6  ?CL 97* 98 100  ?CO2 37* 36* 35*  ?GLUCOSE 152* 138* 152*  ?BUN 21 24* 31*  ?CREATININE 0.74 0.85 0.86  ?CALCIUM 8.1* 8.5* 8.1*  ?GFRNONAA >60 >60 >60  ?PROT 5.0* 4.7* 4.8*  ?ALBUMIN 2.9* 2.7* 2.7*  ?AST 31 26 57*  ?ALT 60* 64* 122*  ?ALKPHOS 46 40 42  ?BILITOT 1.4* 0.9 0.7   ? ? ?RADIOGRAPHIC STUDIES: ?I have personally reviewed the radiological images as listed and agreed with the findings in the report. ?CT Head Wo Contrast ? ?Result Date: 03/05/2022 ?CLINICAL DATA:  Altered ment

## 2022-03-15 ENCOUNTER — Other Ambulatory Visit (HOSPITAL_COMMUNITY): Payer: Self-pay

## 2022-03-15 ENCOUNTER — Encounter: Payer: Self-pay | Admitting: Internal Medicine

## 2022-03-15 LAB — COMPREHENSIVE METABOLIC PANEL
ALT: 332 U/L — ABNORMAL HIGH (ref 0–44)
AST: 123 U/L — ABNORMAL HIGH (ref 15–41)
Albumin: 2.7 g/dL — ABNORMAL LOW (ref 3.5–5.0)
Alkaline Phosphatase: 48 U/L (ref 38–126)
Anion gap: 7 (ref 5–15)
BUN: 37 mg/dL — ABNORMAL HIGH (ref 8–23)
CO2: 38 mmol/L — ABNORMAL HIGH (ref 22–32)
Calcium: 8.1 mg/dL — ABNORMAL LOW (ref 8.9–10.3)
Chloride: 94 mmol/L — ABNORMAL LOW (ref 98–111)
Creatinine, Ser: 1.09 mg/dL — ABNORMAL HIGH (ref 0.44–1.00)
GFR, Estimated: 52 mL/min — ABNORMAL LOW (ref >60)
Glucose, Bld: 117 mg/dL — ABNORMAL HIGH (ref 70–99)
Potassium: 3.2 mmol/L — ABNORMAL LOW (ref 3.5–5.1)
Sodium: 139 mmol/L (ref 135–145)
Total Bilirubin: 0.9 mg/dL (ref 0.3–1.2)
Total Protein: 4.7 g/dL — ABNORMAL LOW (ref 6.5–8.1)

## 2022-03-15 LAB — CBC WITH DIFFERENTIAL/PLATELET
Abs Immature Granulocytes: 0.05 10*3/uL (ref 0.00–0.07)
Basophils Absolute: 0 10*3/uL (ref 0.0–0.1)
Basophils Relative: 0 %
Eosinophils Absolute: 0.1 10*3/uL (ref 0.0–0.5)
Eosinophils Relative: 1 %
HCT: 41.6 % (ref 36.0–46.0)
Hemoglobin: 13.1 g/dL (ref 12.0–15.0)
Immature Granulocytes: 1 %
Lymphocytes Relative: 16 %
Lymphs Abs: 1.1 10*3/uL (ref 0.7–4.0)
MCH: 28.7 pg (ref 26.0–34.0)
MCHC: 31.5 g/dL (ref 30.0–36.0)
MCV: 91.2 fL (ref 80.0–100.0)
Monocytes Absolute: 0.1 10*3/uL (ref 0.1–1.0)
Monocytes Relative: 1 %
Neutro Abs: 5.8 10*3/uL (ref 1.7–7.7)
Neutrophils Relative %: 81 %
Platelets: 171 10*3/uL (ref 150–400)
RBC: 4.56 MIL/uL (ref 3.87–5.11)
RDW: 14.5 % (ref 11.5–15.5)
WBC: 7.1 10*3/uL (ref 4.0–10.5)
nRBC: 0 % (ref 0.0–0.2)

## 2022-03-15 LAB — GLUCOSE, CAPILLARY: Glucose-Capillary: 101 mg/dL — ABNORMAL HIGH (ref 70–99)

## 2022-03-15 MED ORDER — HEPARIN SOD (PORK) LOCK FLUSH 100 UNIT/ML IV SOLN
500.0000 [IU] | Freq: Once | INTRAVENOUS | Status: AC
  Administered 2022-03-15: 500 [IU] via INTRAVENOUS
  Filled 2022-03-15: qty 5

## 2022-03-15 MED ORDER — LEUCOVORIN CALCIUM 15 MG PO TABS: 15.0000 mg | ORAL_TABLET | Freq: Every day | ORAL | 0 refills | Status: DC

## 2022-03-15 MED ORDER — DEXAMETHASONE 2 MG PO TABS: 2.0000 mg | ORAL_TABLET | Freq: Every day | ORAL | 1 refills | Status: DC

## 2022-03-15 MED ORDER — LEUCOVORIN CALCIUM 5 MG PO TABS
15.0000 mg | ORAL_TABLET | Freq: Four times a day (QID) | ORAL | 0 refills | Status: DC
  Filled 2022-03-15: qty 18, 2d supply, fill #0

## 2022-03-15 NOTE — Discharge Instructions (Signed)
We will call you to arrange your next chemotherapy admission.  Right now, Monday April 3rd looks like a good target date. ? ?Please decrease decadron to '2mg'$  daily, and continue all other home meds as prescribed. ? ?Great job this week! ? ?Ventura Sellers, MD ?

## 2022-03-15 NOTE — TOC Progression Note (Signed)
Transition of Care (TOC) - Progression Note  ? ? ?Patient Details  ?Name: Shawnna Pancake ?MRN: [id number] ?Date of Birth: [birth year] ? ?Transition of Care (TOC) CM/SW Contact  ?Cage Gupton, Marta Lamas, LCSW ?Phone Number: ?03/15/2022, 2:45 PM ? ?Clinical Narrative:    ? ?Awaiting HHPT/HHOT orders to resume services through [facility] ? ?Expected Discharge Plan: [place] ?Barriers to Discharge: Continued Medical Work up ? ?Expected Discharge Plan and Services ?Expected Discharge Plan: [place] ?  ?Discharge Planning Services: CM Consult ?  ?Living arrangements for the past 2 months: [place] ?Expected Discharge Date: 03/15/22               ?HH Arranged: PT, OT ?[facility] Agency: [facility] ?Date HH Agency Contacted: 03/11/22 ?Time [place]: 1340 ?Representative spoke with at [place]: Tommi Rumps ? ? ?Social Determinants of Health (SDOH) Interventions ?  ? ?Readmission Risk Interventions ?Readmission Risk Prevention Plan 03/11/2022  ?Transportation Screening Complete  ?PCP or Specialist Appt within 5-7 Days Complete  ?Home Care Screening Complete  ?Medication Review (RN CM) Complete  ? ? ?

## 2022-03-15 NOTE — Discharge Summary (Addendum)
Physician Discharge Summary  ?[name] HCW:[id number] DOB: [birth year] DOA: 03/10/2022 ? ?PCP: Karleen Hampshire., MD ? ?Admit date: 03/10/2022 ?Discharge date: 03/15/2022 ? ?Recommendations for Outpatient Follow-up:  ?Will follow up in brain tumor clinic per Dr. Mickeal Skinner  ? ?Discharge Diagnoses:  ?Primary CNS Lymphoma ? ?Discharge Condition: Stable ?Disposition: Home ? ?Diet recommendation: Heart Healthy ? ?Filed Weights  ? 03/10/22 1134  ?Weight: 186 lb 8.2 oz (84.6 kg)  ? ? ?History of present illness and hospital course: Isabel Gibbs presented for admission for cycle #1 of high dose IV methotrexate and rituximab for CNS lymphoma induction.  She tolerated treatment well without clinical complication.  We kept urine alkalinized and administered leucovorin rescue per protocol.  She continued antibiotics for urthethritis during the admission.  Discharge condition is stable.  Methotrexate level was 0.14 based on 5am blood draw on 03/15/22.  Prescribed oral leucovorin '15mg'$  q6 x6 additional doses for the patient to take at home.  Will also discharge with decreased dose of decadron, '2mg'$  daily. ? ?Today's assessment: Sitting comfortably, no complaints. ? ?Vitals:  ?Vitals:  ? 03/15/22 0539 03/15/22 0841  ?BP: (!) 111/47 (!) 105/44  ?Pulse: 60 63  ?Resp: 17 16  ?Temp: 97.8 ?F (36.6 ?C) 97.6 ?F (36.4 ?C)  ?SpO2: 96% 95%  ? ?KPS: 60. ?General: Alert, cooperative, pleasant, in no acute distress ?Head: Craniotomy scar noted, dry and intact. ?EENT: No conjunctival injection or scleral icterus. Oral mucosa moist ?Lungs: Resp effort normal ?Cardiac: Regular rate and rhythm ?Abdomen: Soft, non-distended abdomen ?Skin: No rashes cyanosis or petechiae. ?Extremities: No clubbing or edema ?  ?NEUROLOGIC EXAM: ?MMental Status: Awake, alert. Oriented to self and environment. Language is impaired with regards to fluency, comprehension intact to multi step.  ?Cranial Nerves: Visual acuity is grossly normal. Visual fields are full. Extra-ocular  movements intact. No ptosis. Face is symmetric ?Motor: Tone and bulk are normal. Power is noted 4/5 in right arm and leg. Reflexes are symmetric, no pathologic reflexes present.  ?Sensory: Intact to light touch ?Gait: Deferred ? ? ?Discharge Instructions ? ?We will call you to arrange your next chemotherapy admission.  Right now, Monday April 3rd looks like a good target date. ? ?Please decrease decadron to '2mg'$  daily, and continue all other home meds as prescribed. ? ?Great job this week! ? ?Ventura Sellers, MD ? ? ?Allergies as of 03/15/2022   ? ?   Reactions  ? Codeine Other (See Comments)  ? Severe GI upset  ? Lisinopril Cough  ?   ? Hydrocodone-acetaminophen Nausea Only  ? ?  ? ?  ?Medication List  ?  ? ?TAKE these medications   ? ?acetaminophen 500 MG tablet ?Commonly known as: TYLENOL ?Take 500-1,000 mg by mouth 2 (two) times daily as needed for mild pain. ?  ?alendronate 70 MG tablet ?Commonly known as: FOSAMAX ?Take 70 mg by mouth every Friday. ?  ?cefadroxil 500 MG capsule ?Commonly known as: DURICEF ?Take 2 capsules (1,000 mg total) by mouth 2 (two) times daily for 7 days. ?  ?CRANBERRY PO ?Take 1 tablet by mouth daily. ?  ?dexamethasone 2 MG tablet ?Commonly known as: DECADRON ?Take 1 tablet (2 mg total) by mouth daily. ?What changed:  ?medication strength ?how much to take ?when to take this ?  ?gabapentin 300 MG capsule ?Commonly known as: NEURONTIN ?Take 900 mg by mouth See admin instructions. '900mg'$  in the afternoon and at bedtime ?  ?irbesartan 150 MG tablet ?Commonly known as: AVAPRO ?Take 150 mg  by mouth at bedtime. ?  ?Systane Ultra PF 0.4-0.3 % Soln ?Generic drug: Polyethyl Glyc-Propyl Glyc PF ?Place 1 drop into both eyes 3 (three) times daily as needed (for dryness). ?  ?Vitamin D (Ergocalciferol) 1.25 MG (50000 UNIT) Caps capsule ?Commonly known as: DRISDOL ?Take 50,000 Units by mouth every Wednesday. ?  ? ?  ? ?Allergies  ?Allergen Reactions  ? Codeine Other (See Comments)  ?  Severe GI upset ?   ? Lisinopril Cough  ?   ?  ? Hydrocodone-Acetaminophen Nausea Only  ? ? ?The results of significant diagnostics from this hospitalization (including imaging, microbiology, ancillary and laboratory) are listed below for reference.   ? ?Significant Diagnostic Studies: ?CT Head Wo Contrast ? ?Result Date: 03/05/2022 ?CLINICAL DATA:  Altered mental status EXAM: CT HEAD WITHOUT CONTRAST TECHNIQUE: Contiguous axial images were obtained from the base of the skull through the vertex without intravenous contrast. RADIATION DOSE REDUCTION: This exam was performed according to the departmental dose-optimization program which includes automated exposure control, adjustment of the mA and/or kV according to patient size and/or use of iterative reconstruction technique. COMPARISON:  02/14/2022 MRI, correlation is also made with CT head 01/02/2022 FINDINGS: Brain: Likely further interval increase in the size of the mass spanning the bilateral frontal lobes, which measures approximately 5.3 x 5.4 x 4.8 cm (series 3, image 17 and series 4, image 24), previously 3.2 x 4.1 x 4.1 cm when remeasured similarly on the 02/14/2022 MRI, although comparison is limited by the absence of intravenous contrast on the current study and differences in modality. Increased surrounding hypodensity, likely edema. Increased mass effect on the frontal horns of the lateral ventricles. Additional previously noted lesion in the right basal ganglia has also likely increased in size (series 3, image 17) No definite acute infarct, hemorrhage, hydrocephalus, or extra-axial collection. Vascular: No hyperdense vessel. Skull: Right occipital craniotomy. Left frontal burr hole. Hyperostosis frontalis. No acute osseous abnormality. Sinuses/Orbits: No acute finding. Status post bilateral lens replacements. Other: The mastoids are well aerated. IMPRESSION: 1. Suspect significant increase in the size of the mass spanning the bilateral frontal lobes, when accounting for  the absence of intravenous contrast and differences in technique between the current study and the prior MRI. The right basal ganglia lesion has likely also increased in size. Consider MRI with and without contrast for better comparison. 2. No other acute intracranial process. Electronically Signed   By: Merilyn Baba M.D.   On: 03/05/2022 19:35  ? ?MR Brain W and Wo Contrast ? ?Result Date: 03/05/2022 ?CLINICAL DATA:  Follow-up examination for CNS neoplasm. EXAM: MRI HEAD WITHOUT AND WITH CONTRAST TECHNIQUE: Multiplanar, multiecho pulse sequences of the brain and surrounding structures were obtained without and with intravenous contrast. CONTRAST:  63m GADAVIST GADOBUTROL 1 MMOL/ML IV SOLN COMPARISON:  Prior CT from earlier the same day as well as previous MRI from 02/14/2022. FINDINGS: Brain: There has been interval progression and worsening of disease with increased size of the bifrontal mass as compared to most recent MRI from 02/14/2022. The site of the initial lesion at the anterior inferior left frontal lobe has increased in size, now measuring 4.9 x 3.4 cm in this region, previously 3.5 x 2.5 cm when measured in a similar fashion. Bulky tumor extends superiorly, and crosses the midline across the anterior genu of the corpus callosum, with involvement of the contralateral right frontal lobe. This as all increased in size from previous. Superior extension into the left greater than right frontal lobes superiorly  has also progressed (series 20, image 89). Overall, size of the primary lesion has more than doubled in size from prior. Associated enhancement is more pronounced and increased from prior. Additionally, associated susceptibility artifact consistent with blood products is also increased from prior. Progressive mass effect on the anterior aspect of the lateral ventricles anteriorly. No hydrocephalus or trapping. Associated vasogenic edema within the anterior inferior left greater than right frontal lobes  has also progressed. Localized left-to-right shift of up to approximately 7 mm noted along the anterior/inferior falx. An additional site of lymphomatous deposit again seen within the right basal ganglia, mea

## 2022-03-17 ENCOUNTER — Other Ambulatory Visit: Payer: Self-pay | Admitting: *Deleted

## 2022-03-17 DIAGNOSIS — C8589 Other specified types of non-Hodgkin lymphoma, extranodal and solid organ sites: Secondary | ICD-10-CM

## 2022-03-17 LAB — METHOTREXATE: Methotrexate: 0.14

## 2022-03-19 ENCOUNTER — Telehealth: Payer: Self-pay | Admitting: Internal Medicine

## 2022-03-19 NOTE — Telephone Encounter (Signed)
.  Called pt per 3/20 inbasket , Patient son was unavailable, a message with appt time and date was left with sons number on file.   ?

## 2022-03-25 ENCOUNTER — Inpatient Hospital Stay: Payer: Medicare Other | Admitting: Internal Medicine

## 2022-03-25 ENCOUNTER — Inpatient Hospital Stay: Payer: Medicare Other | Attending: Internal Medicine

## 2022-03-25 ENCOUNTER — Other Ambulatory Visit: Payer: Self-pay

## 2022-03-25 VITALS — BP 111/49 | HR 68 | Temp 97.7°F | Resp 17 | Ht 67.0 in

## 2022-03-25 DIAGNOSIS — C8589 Other specified types of non-Hodgkin lymphoma, extranodal and solid organ sites: Secondary | ICD-10-CM | POA: Insufficient documentation

## 2022-03-25 DIAGNOSIS — R531 Weakness: Secondary | ICD-10-CM | POA: Diagnosis not present

## 2022-03-25 DIAGNOSIS — Z993 Dependence on wheelchair: Secondary | ICD-10-CM | POA: Insufficient documentation

## 2022-03-25 DIAGNOSIS — R5383 Other fatigue: Secondary | ICD-10-CM | POA: Diagnosis not present

## 2022-03-25 LAB — CBC WITH DIFFERENTIAL (CANCER CENTER ONLY)
Abs Immature Granulocytes: 0.5 10*3/uL — ABNORMAL HIGH (ref 0.00–0.07)
Basophils Absolute: 0.1 10*3/uL (ref 0.0–0.1)
Basophils Relative: 1 %
Eosinophils Absolute: 0.2 10*3/uL (ref 0.0–0.5)
Eosinophils Relative: 3 %
HCT: 38.8 % (ref 36.0–46.0)
Hemoglobin: 12.4 g/dL (ref 12.0–15.0)
Immature Granulocytes: 8 %
Lymphocytes Relative: 19 %
Lymphs Abs: 1.3 10*3/uL (ref 0.7–4.0)
MCH: 28.8 pg (ref 26.0–34.0)
MCHC: 32 g/dL (ref 30.0–36.0)
MCV: 90 fL (ref 80.0–100.0)
Monocytes Absolute: 0.7 10*3/uL (ref 0.1–1.0)
Monocytes Relative: 11 %
Neutro Abs: 3.8 10*3/uL (ref 1.7–7.7)
Neutrophils Relative %: 58 %
Platelet Count: 221 10*3/uL (ref 150–400)
RBC: 4.31 MIL/uL (ref 3.87–5.11)
RDW: 14.6 % (ref 11.5–15.5)
Smear Review: NORMAL
WBC Count: 6.5 10*3/uL (ref 4.0–10.5)
nRBC: 0 % (ref 0.0–0.2)

## 2022-03-25 LAB — CMP (CANCER CENTER ONLY)
ALT: 30 U/L (ref 0–44)
AST: 11 U/L — ABNORMAL LOW (ref 15–41)
Albumin: 3.3 g/dL — ABNORMAL LOW (ref 3.5–5.0)
Alkaline Phosphatase: 69 U/L (ref 38–126)
Anion gap: 5 (ref 5–15)
BUN: 23 mg/dL (ref 8–23)
CO2: 32 mmol/L (ref 22–32)
Calcium: 8.8 mg/dL — ABNORMAL LOW (ref 8.9–10.3)
Chloride: 101 mmol/L (ref 98–111)
Creatinine: 0.95 mg/dL (ref 0.44–1.00)
GFR, Estimated: >60 mL/min (ref >60)
Glucose, Bld: 210 mg/dL — ABNORMAL HIGH (ref 70–99)
Potassium: 4.3 mmol/L (ref 3.5–5.1)
Sodium: 138 mmol/L (ref 135–145)
Total Bilirubin: 0.5 mg/dL (ref 0.3–1.2)
Total Protein: 5.9 g/dL — ABNORMAL LOW (ref 6.5–8.1)

## 2022-03-25 NOTE — Progress Notes (Signed)
? ?[address]  ?[place], [place] [address] ?[phone] ? ? ?Interval Evaluation ? ?Date of Service: 03/25/22 ?Patient Name: Isabel Gibbs ?Patient MRN: [id number] ?Patient DOB: October 21, 1946 ?Provider: Ventura Sellers, MD ? ?Identifying Statement:  ?Isabel Gibbs is a 76 y.o. female with left frontal  CNS lymphoma   ? ?Oncologic History: ?Oncology History  ?CNS lymphoma ([facility])  ?01/02/2022 Cancer Staging  ? Staging form: Brain and Spinal Cord, AJCC Version 9 ?- Clinical stage from 01/02/2022: WHO G4 - Signed by Ventura Sellers, MD on 01/27/2022 ?Histopathologic type: Malignant lymphoma, large B-cell, diffuse, NOS ?Stage prefix: Initial diagnosis ?Histologic grading system: 4 grade system ? ?  ?01/27/2022 Initial Diagnosis  ? CNS lymphoma ([facility]) ?  ?03/11/2022 -  Chemotherapy  ? Patient is on Treatment Plan : IP NON-HODGKINS LYMPHOMA High Dose Methotrexate + Leucovorin Rescue  ?   ?03/13/2022 -  Chemotherapy  ? Patient is on Treatment Plan : NON-HODGKINS LYMPHOMA Rituximab q21d  ?   ? ? ?Biomarkers: ? ?MGMT Unknown.  ?IDH 1/2 Unknown.  ?EGFR Unknown  ?CD20 positive  ? ?Interval History: ?Isabel Gibbs presents today for follow up, now having completed first cycle of methotrexate and rituximab.  She denies new or progressive deficits today.  Continues to describe leg weakness on both sides, limiting her ability to stand from seated position and climb stairs.  No issues with hands/arms.  Currently on decadron 3m daily. ? ?  She describes overall modest improvement in functional status since starting the decadron, as discussed previously over the phone.  She uses a walker, but at times doesn't need to just  going from bedroom to kitchen.  She mostly dresses and toilets herself.  Family noticed she has some difficulty writing checks, but language and speech appear to be normal.  Denies headaches, seizures.  No issues with any of the workup, imaging. ? ?H+P (01/30/22) Patient presented to  medical attention earlier this month after a fall at home, injuring her hip.  She underwent full body scan under trauma protocol, which identified an enhancing lesion in the left frontal lobe consistent with likely primary brain tumor.  Stereotactic biopsy was performed by Dr. OZada Finderson 01/02/22; path demonstrated CD20+ B-cell lymphoma.  Since surgery, she has experienced decline in functional status.  She is now relying on a walker or wheelchair due to worsening right sided weakness, arm and leg.  There is increased difficulty with language, expression, finding words.  Additionally, she is very fatigued and sleeps often throughout the day.  Currently cared for by her sons, here at bedside today. ? ? ?Medications: ?Current Outpatient Medications on File Prior to Visit  ?Medication Sig Dispense Refill  ? acetaminophen (TYLENOL) 500 MG tablet Take 500-1,000 mg by mouth 2 (two) times daily as needed for mild pain.    ? alendronate (FOSAMAX) 70 MG tablet Take 70 mg by mouth every Friday.    ? CRANBERRY PO Take 1 tablet by mouth daily.    ? dexamethasone (DECADRON) 2 MG tablet Take 1 tablet (2 mg total) by mouth daily. 60 tablet 1  ? gabapentin (NEURONTIN) 300 MG capsule Take 900 mg by mouth See admin instructions. 905min the afternoon and at bedtime    ? irbesartan (AVAPRO) 150 MG tablet Take 150 mg by mouth at bedtime.    ? SYSTANE ULTRA PF 0.4-0.3 % SOLN Place 1 drop into both eyes 3 (three) times daily as needed (for dryness).    ?  Vitamin D, Ergocalciferol, (DRISDOL) 1.25 MG (50000 UNIT) CAPS capsule Take 50,000 Units by mouth every Wednesday.    ? leucovorin (WELLCOVORIN) 5 MG tablet Take 3 tablets (15 mg total) by mouth every 6 (six) hours for 6 additional doses. (Patient not taking: Reported on 03/25/2022) 18 tablet 0  ? ?No current facility-administered medications on file prior to visit.  ? ? ?Allergies:  ?Allergies  ?Allergen Reactions  ? Codeine Other (See Comments)  ?  Severe GI upset ?  ? Lisinopril Cough   ?   ?  ? Hydrocodone-Acetaminophen Nausea Only  ? ?Past Medical History:  ?Past Medical History:  ?Diagnosis Date  ? Hypertension   ? Prediabetes   ? 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 ?Past Surgical History:  ?Past Surgical History:  ?Procedure Laterality Date  ? APPLICATION OF CRANIAL NAVIGATION Left 01/02/2022  ? Procedure: APPLICATION OF CRANIAL NAVIGATION;  Surgeon: Judith Part, MD;  Location: [facility];  Service: Neurosurgery;  Laterality: Left;  ? CEREBRAL MICROVASCULAR DECOMPRESSION Right 08/02/2018  ? [facility]  ? FRAMELESS  BIOPSY WITH BRAINLAB Left 01/02/2022  ? Procedure: LEFT BRAIN  BIOPSY WITH [facility] Rathke;  Surgeon: Judith Part, MD;  Location: [facility];  Service: Neurosurgery;  Laterality: Left;  ? IR IMAGING GUIDED PORT INSERTION  03/04/2022  ? RADIOLOGY WITH ANESTHESIA N/A 01/01/2022  ? Procedure: MRI of Brain with and without contrast;  Surgeon: Radiologist, Medication, MD;  Location: [facility];  Service: Radiology;  Laterality: N/A;  ? ?Social History:  ?Social History  ? ?Socioeconomic History  ? Marital status: Widowed  ?  Spouse name: Not on file  ? Number of children: Not on file  ? Years of education: Not on file  ? Highest education level: Not on file  ?Occupational History  ? Not on file  ?Tobacco Use  ? Smoking status: Former  ?  Types: Cigarettes  ?  Quit date: 2000  ?  Years since quitting: 23.2  ? Smokeless tobacco: Never  ?Substance and Sexual Activity  ? Alcohol use: Not on file  ? Drug use: Not on file  ? Sexual activity: Not on file  ?Other Topics Concern  ? Not on file  ?Social History Narrative  ? Not on file  ? ?Social Determinants of Health  ? ?Financial Resource Strain: Not on file  ?Food Insecurity: Not on file  ?Transportation Needs: Not on file  ?Physical Activity: Not on file  ?Stress: Not on file  ?Social Connections: Not on file  ?Intimate Partner Violence: Not on file  ? ?Family History:  ?Family History  ?Problem Relation Age of Onset  ? Heart failure Brother   ?  Diabetes Brother   ? Yves Dill Parkinson White syndrome Son   ? ? ?Review of Systems: ?Constitutional: Doesn't report fevers, chills or abnormal weight loss ?Eyes: Doesn't report blurriness of vision ?Ears, nose, mouth, throat, and face: Doesn't report sore throat ?Respiratory: Doesn't report cough, dyspnea or wheezes ?Cardiovascular: Doesn't report palpitation, chest discomfort  ?Gastrointestinal:  Doesn't report nausea, constipation, diarrhea ?GU: Doesn't report incontinence ?Skin: Doesn't report skin rashes ?Neurological: Per HPI ?Musculoskeletal: Doesn't report joint pain ?Behavioral/Psych: Doesn't report anxiety ? ?Physical Exam: ?Vitals:  ? 03/25/22 1145  ?BP: (!) 111/49  ?Pulse: 68  ?Resp: 17  ?Temp: 97.7 ?F (36.5 ?C)  ?SpO2: 98%  ? ?KPS: 60. ?General: Alert, cooperative, pleasant, in no acute distress ?Head: Normal ?EENT: No conjunctival injection or scleral icterus.  ?Lungs: Resp effort normal ?Cardiac: Regular  rate ?Abdomen: Non-distended abdomen ?Skin: No rashes cyanosis or petechiae. ?Extremities: No clubbing or edema ? ?Neurologic Exam: ?Mental Status: Awake, but drowsy, attentive to examiner only with stimulation. Oriented to self and environment. Language is impaired with regards to fluency, comprehension intact to multi step.  ?Cranial Nerves: Visual acuity is grossly normal. Visual fields are full. Extra-ocular movements intact. No ptosis. Face is symmetric ?Motor: Tone and bulk are normal. Noted hip girdle weakness, symmetric. Reflexes are symmetric, no pathologic reflexes present.  ?Sensory: Intact to light touch ?Gait: Limited by motor dysfunction ? ? ?Labs: ?I have reviewed the data as listed ?   ?Component Value Date/Time  ? NA 139 03/15/2022 0517  ? K 3.2 (L) 03/15/2022 0517  ? CL 94 (L) 03/15/2022 0517  ? CO2 38 (H) 03/15/2022 0517  ? GLUCOSE 117 (H) 03/15/2022 0517  ? BUN 37 (H) 03/15/2022 0517  ? CREATININE 1.09 (H) 03/15/2022 0517  ? CALCIUM 8.1 (L) 03/15/2022 0517  ? PROT 4.7 (L) 03/15/2022  0517  ? ALBUMIN 2.7 (L) 03/15/2022 0517  ? AST 123 (H) 03/15/2022 0517  ? ALT 332 (H) 03/15/2022 0517  ? ALKPHOS 48 03/15/2022 0517  ? BILITOT 0.9 03/15/2022 0517  ? GFRNONAA 52 (L) 03/15/2022 0517  ? ?[place]

## 2022-03-31 ENCOUNTER — Inpatient Hospital Stay (HOSPITAL_COMMUNITY)
Admission: RE | Admit: 2022-03-31 | Discharge: 2022-04-07 | DRG: 842 | Disposition: A | Discharge status: Home / Self Care | Payer: Medicare Other | Source: Ambulatory Visit | Attending: Internal Medicine | Admitting: Internal Medicine

## 2022-03-31 ENCOUNTER — Encounter (HOSPITAL_COMMUNITY): Payer: Self-pay | Admitting: Internal Medicine

## 2022-03-31 ENCOUNTER — Other Ambulatory Visit: Payer: Self-pay | Admitting: Pharmacist

## 2022-03-31 ENCOUNTER — Other Ambulatory Visit: Payer: Self-pay

## 2022-03-31 DIAGNOSIS — I10 Essential (primary) hypertension: Secondary | ICD-10-CM | POA: Diagnosis present

## 2022-03-31 DIAGNOSIS — R309 Painful micturition, unspecified: Secondary | ICD-10-CM

## 2022-03-31 DIAGNOSIS — R7303 Prediabetes: Secondary | ICD-10-CM | POA: Diagnosis present

## 2022-03-31 DIAGNOSIS — Z806 Family history of leukemia: Secondary | ICD-10-CM

## 2022-03-31 DIAGNOSIS — Z5111 Encounter for antineoplastic chemotherapy: Secondary | ICD-10-CM | POA: Diagnosis not present

## 2022-03-31 DIAGNOSIS — C8339 Diffuse large B-cell lymphoma, extranodal and solid organ sites: Secondary | ICD-10-CM | POA: Diagnosis present

## 2022-03-31 DIAGNOSIS — Z87891 Personal history of nicotine dependence: Secondary | ICD-10-CM | POA: Diagnosis not present

## 2022-03-31 DIAGNOSIS — Z79899 Other long term (current) drug therapy: Secondary | ICD-10-CM

## 2022-03-31 DIAGNOSIS — Z7901 Long term (current) use of anticoagulants: Secondary | ICD-10-CM | POA: Diagnosis not present

## 2022-03-31 DIAGNOSIS — C8589 Other specified types of non-Hodgkin lymphoma, extranodal and solid organ sites: Secondary | ICD-10-CM | POA: Diagnosis not present

## 2022-03-31 DIAGNOSIS — Z8249 Family history of ischemic heart disease and other diseases of the circulatory system: Secondary | ICD-10-CM | POA: Diagnosis not present

## 2022-03-31 DIAGNOSIS — Z833 Family history of diabetes mellitus: Secondary | ICD-10-CM | POA: Diagnosis not present

## 2022-03-31 DIAGNOSIS — Z5112 Encounter for antineoplastic immunotherapy: Secondary | ICD-10-CM | POA: Diagnosis not present

## 2022-03-31 DIAGNOSIS — N3000 Acute cystitis without hematuria: Secondary | ICD-10-CM

## 2022-03-31 DIAGNOSIS — E876 Hypokalemia: Secondary | ICD-10-CM

## 2022-03-31 DIAGNOSIS — R0981 Nasal congestion: Secondary | ICD-10-CM | POA: Diagnosis not present

## 2022-03-31 DIAGNOSIS — L89151 Pressure ulcer of sacral region, stage 1: Secondary | ICD-10-CM

## 2022-03-31 LAB — COMPREHENSIVE METABOLIC PANEL
ALT: 20 U/L (ref 0–44)
AST: 17 U/L (ref 15–41)
Albumin: 2.8 g/dL — ABNORMAL LOW (ref 3.5–5.0)
Alkaline Phosphatase: 77 U/L (ref 38–126)
Anion gap: 6 (ref 5–15)
BUN: 21 mg/dL (ref 8–23)
CO2: 30 mmol/L (ref 22–32)
Calcium: 8.4 mg/dL — ABNORMAL LOW (ref 8.9–10.3)
Chloride: 102 mmol/L (ref 98–111)
Creatinine, Ser: 0.9 mg/dL (ref 0.44–1.00)
GFR, Estimated: >60 mL/min (ref >60)
Glucose, Bld: 135 mg/dL — ABNORMAL HIGH (ref 70–99)
Potassium: 4.2 mmol/L (ref 3.5–5.1)
Sodium: 138 mmol/L (ref 135–145)
Total Bilirubin: 0.5 mg/dL (ref 0.3–1.2)
Total Protein: 5.7 g/dL — ABNORMAL LOW (ref 6.5–8.1)

## 2022-03-31 LAB — CBC WITH DIFFERENTIAL/PLATELET
Abs Immature Granulocytes: 1.14 10*3/uL — ABNORMAL HIGH (ref 0.00–0.07)
Basophils Absolute: 0.1 10*3/uL (ref 0.0–0.1)
Basophils Relative: 0 %
Eosinophils Absolute: 0.2 10*3/uL (ref 0.0–0.5)
Eosinophils Relative: 2 %
HCT: 36.8 % (ref 36.0–46.0)
Hemoglobin: 11.4 g/dL — ABNORMAL LOW (ref 12.0–15.0)
Immature Granulocytes: 9 %
Lymphocytes Relative: 26 %
Lymphs Abs: 3.2 10*3/uL (ref 0.7–4.0)
MCH: 28.7 pg (ref 26.0–34.0)
MCHC: 31 g/dL (ref 30.0–36.0)
MCV: 92.7 fL (ref 80.0–100.0)
Monocytes Absolute: 0.9 10*3/uL (ref 0.1–1.0)
Monocytes Relative: 8 %
Neutro Abs: 6.9 10*3/uL (ref 1.7–7.7)
Neutrophils Relative %: 55 %
Platelets: 236 10*3/uL (ref 150–400)
RBC: 3.97 MIL/uL (ref 3.87–5.11)
RDW: 15.6 % — ABNORMAL HIGH (ref 11.5–15.5)
WBC: 12.4 10*3/uL — ABNORMAL HIGH (ref 4.0–10.5)
nRBC: 0 % (ref 0.0–0.2)

## 2022-03-31 LAB — URINALYSIS, COMPLETE (UACMP) WITH MICROSCOPIC
Bilirubin Urine: NEGATIVE
Glucose, UA: NEGATIVE mg/dL
Hgb urine dipstick: NEGATIVE
Ketones, ur: NEGATIVE mg/dL
Nitrite: NEGATIVE
Protein, ur: NEGATIVE mg/dL
Specific Gravity, Urine: 1.012 (ref 1.005–1.030)
pH: 5 (ref 5.0–8.0)

## 2022-03-31 MED ORDER — SODIUM CHLORIDE 0.9 % IV SOLN
8.0000 mg | Freq: Three times a day (TID) | INTRAVENOUS | Status: DC | PRN
  Filled 2022-03-31: qty 4

## 2022-03-31 MED ORDER — GABAPENTIN 300 MG PO CAPS: 900.0000 mg | ORAL_CAPSULE | ORAL | Status: DC

## 2022-03-31 MED ORDER — GABAPENTIN 300 MG PO CAPS
900.0000 mg | ORAL_CAPSULE | Freq: Two times a day (BID) | ORAL | Status: DC
  Administered 2022-03-31 – 2022-04-07 (×15): 900 mg via ORAL
  Filled 2022-03-31 (×15): qty 3

## 2022-03-31 MED ORDER — SENNOSIDES-DOCUSATE SODIUM 8.6-50 MG PO TABS: 1.0000 | ORAL_TABLET | Freq: Every evening | ORAL | Status: DC | PRN

## 2022-03-31 MED ORDER — SODIUM BICARBONATE 8.4 % IV SOLN
INTRAVENOUS | Status: DC
  Filled 2022-03-31: qty 150
  Filled 2022-03-31: qty 1000
  Filled 2022-03-31: qty 150
  Filled 2022-03-31 (×2): qty 1000
  Filled 2022-03-31 (×2): qty 150
  Filled 2022-03-31 (×3): qty 1000
  Filled 2022-03-31: qty 150
  Filled 2022-03-31: qty 1000
  Filled 2022-03-31: qty 150
  Filled 2022-03-31 (×2): qty 1000

## 2022-03-31 MED ORDER — IRBESARTAN 150 MG PO TABS
150.0000 mg | ORAL_TABLET | Freq: Every day | ORAL | Status: DC
  Administered 2022-03-31 – 2022-04-06 (×7): 150 mg via ORAL
  Filled 2022-03-31 (×7): qty 1

## 2022-03-31 MED ORDER — ACETAMINOPHEN 325 MG PO TABS
650.0000 mg | ORAL_TABLET | ORAL | Status: DC | PRN
  Administered 2022-03-31 – 2022-04-05 (×4): 650 mg via ORAL
  Filled 2022-03-31 (×4): qty 2

## 2022-03-31 MED ORDER — SODIUM BICARBONATE 8.4 % IV SOLN: INTRAVENOUS | Status: DC

## 2022-03-31 MED ORDER — ONDANSETRON HCL 4 MG PO TABS: 4.0000 mg | ORAL_TABLET | Freq: Three times a day (TID) | ORAL | Status: DC | PRN

## 2022-03-31 MED ORDER — ALUM & MAG HYDROXIDE-SIMETH 200-200-20 MG/5ML PO SUSP: 60.0000 mL | ORAL | Status: DC | PRN

## 2022-03-31 MED ORDER — ENOXAPARIN SODIUM 40 MG/0.4ML IJ SOSY
40.0000 mg | PREFILLED_SYRINGE | INTRAMUSCULAR | Status: DC
  Administered 2022-03-31 – 2022-04-02 (×3): 40 mg via SUBCUTANEOUS
  Filled 2022-03-31 (×3): qty 0.4

## 2022-03-31 MED ORDER — ONDANSETRON HCL 4 MG/2ML IJ SOLN: 4.0000 mg | Freq: Three times a day (TID) | INTRAMUSCULAR | Status: DC | PRN

## 2022-03-31 MED ORDER — ONDANSETRON 4 MG PO TBDP: 4.0000 mg | ORAL_TABLET | Freq: Three times a day (TID) | ORAL | Status: DC | PRN

## 2022-03-31 MED ORDER — GUAIFENESIN-DM 100-10 MG/5ML PO SYRP: 10.0000 mL | ORAL_SOLUTION | ORAL | Status: DC | PRN

## 2022-03-31 NOTE — H&P (Signed)
[facility] ?Neuro-Oncology Admission Note ? ?Patient Care Team: ?Karleen Hampshire., MD as PCP - General (Internal Medicine) ? ?CHIEF COMPLAINTS/PURPOSE OF CONSULTATION:  ?Primary CNS Lymphoma ? ?Oncology History  ?CNS lymphoma ([place])  ?01/02/2022 Cancer Staging  ? Staging form: Brain and Spinal Cord, AJCC Version 9 ?- Clinical stage from 01/02/2022: WHO G4 - Signed by Ventura Sellers, MD on 01/27/2022 ?Histopathologic type: Malignant lymphoma, large B-cell, diffuse, NOS ?Stage prefix: Initial diagnosis ?Histologic grading system: 4 grade system ? ?  ?01/27/2022 Initial Diagnosis  ? CNS lymphoma ([place]) ?  ?03/11/2022 -  Chemotherapy  ? Patient is on Treatment Plan : IP NON-HODGKINS LYMPHOMA High Dose Methotrexate + Leucovorin Rescue  ?   ?03/13/2022 -  Chemotherapy  ? Patient is on Treatment Plan : NON-HODGKINS LYMPHOMA Rituximab q21d  ?   ? ? ?HISTORY OF PRESENTING ILLNESS:  ?Isabel Gibbs 76 y.o. female presents today for cycle #2 of methotrexate induction+rituximab for primary cns lymphoma.  She reports no new changes since our clinic visit last week.  No further urinary symptoms.  She has successfully discontinued decadron.  No other clinical changes reported. ? ? ?MEDICAL HISTORY:  ?Past Medical History:  ?Diagnosis Date  ? Hypertension   ? Prediabetes   ? 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 ? ?SURGICAL HISTORY: ?Past Surgical History:  ?Procedure Laterality Date  ? APPLICATION OF CRANIAL NAVIGATION Left 01/02/2022  ? Procedure: APPLICATION OF CRANIAL NAVIGATION;  Surgeon: Judith Part, MD;  Location: [facility];  Service: Neurosurgery;  Laterality: Left;  ? CEREBRAL MICROVASCULAR DECOMPRESSION Right 08/02/2018  ? [place]  ? FRAMELESS  BIOPSY WITH BRAINLAB Left 01/02/2022  ? Procedure: LEFT BRAIN  BIOPSY WITH Lucky Rathke;  Surgeon: Judith Part, MD;  Location: [facility];  Service: Neurosurgery;  Laterality: Left;  ? IR IMAGING GUIDED PORT INSERTION  03/04/2022  ? RADIOLOGY WITH ANESTHESIA N/A 01/01/2022  ?  Procedure: MRI of Brain with and without contrast;  Surgeon: Radiologist, Medication, MD;  Location: [facility];  Service: Radiology;  Laterality: N/A;  ? ? ?SOCIAL HISTORY: ?Social History  ? ?Socioeconomic History  ? Marital status: Widowed  ?  Spouse name: Not on file  ? Number of children: Not on file  ? Years of education: Not on file  ? Highest education level: Not on file  ?Occupational History  ? Not on file  ?Tobacco Use  ? Smoking status: Former  ?  Types: Cigarettes  ?  Quit date: 2000  ?  Years since quitting: 23.2  ? Smokeless tobacco: Never  ?Substance and Sexual Activity  ? Alcohol use: Not on file  ? Drug use: Not on file  ? Sexual activity: Not on file  ?Other Topics Concern  ? Not on file  ?Social History Narrative  ? Not on file  ? ?Social Determinants of Health  ? ?Financial Resource Strain: Not on file  ?Food Insecurity: Not on file  ?Transportation Needs: Not on file  ?Physical Activity: Not on file  ?Stress: Not on file  ?Social Connections: Not on file  ?Intimate Partner Violence: Not on file  ? ? ?FAMILY HISTORY: ?Family History  ?Problem Relation Age of Onset  ? Heart failure Brother   ? Diabetes Brother   ? Yves Dill Parkinson White syndrome Son   ? ? ?ALLERGIES:  is allergic to codeine, lisinopril, and hydrocodone-acetaminophen. ? ?MEDICATIONS:  ?No current facility-administered medications for this encounter.  ? ? ?REVIEW OF SYSTEMS:   ?Constitutional: Denies  fevers, chills or abnormal weight loss ?Eyes: Denies blurriness of vision ?Ears, nose, mouth, throat, and face: Denies mucositis or sore throat ?Respiratory: Denies cough, dyspnea or wheezes ?Cardiovascular: Denies palpitation, chest discomfort or lower extremity swelling ?Gastrointestinal:  Denies nausea, constipation, diarrhea ?GU: Denies dysuria or incontinence ?Skin: Denies abnormal skin rashes ?Neurological: Per HPI ?Musculoskeletal: Denies joint pain, back or neck discomfort. No decrease in ROM ?Behavioral/Psych: Denies anxiety,  disturbance in thought content, and mood instability ? ? ?PHYSICAL EXAMINATION: ?Vitals:  ? 03/31/22 1020  ?BP: (!) 113/58  ?Pulse: 77  ?Resp: 16  ?Temp: (!) 97.5 ?F (36.4 ?C)  ?SpO2: 99%  ? ? ?KPS: 60. ?General: Alert, cooperative, pleasant, in no acute distress ?Head: Craniotomy scar noted, dry and intact. ?EENT: No conjunctival injection or scleral icterus. Oral mucosa moist ?Lungs: Resp effort normal ?Cardiac: Regular rate and rhythm ?Abdomen: Soft, non-distended abdomen ?Skin: No rashes cyanosis or petechiae. ?Extremities: No clubbing or edema ? ?NEUROLOGIC EXAM: ?MMental Status: Awake, but drowsy, attentive to examiner only with stimulation. Oriented to self and environment. Language is impaired with regards to fluency, comprehension intact to multi step.  ?Cranial Nerves: Visual acuity is grossly normal. Visual fields are full. Extra-ocular movements intact. No ptosis. Face is symmetric ?Motor: Tone and bulk are normal. Power is noted 4/5 in right arm and leg. Reflexes are symmetric, no pathologic reflexes present.  ?Sensory: Intact to light touch ?Gait: Hemiparetic ? ? ?LABORATORY DATA:  ?I have reviewed the data as listed ?Lab Results  ?Component Value Date  ? WBC 6.5 03/25/2022  ? HGB 12.4 03/25/2022  ? HCT 38.8 03/25/2022  ? MCV 90.0 03/25/2022  ? PLT 221 03/25/2022  ? ?Recent Labs  ?  03/14/22 ?4782 03/15/22 ?9562 03/25/22 ?1141  ?NA 141 139 138  ?K 3.6 3.2* 4.3  ?CL 100 94* 101  ?CO2 35* 38* 32  ?GLUCOSE 152* 117* 210*  ?BUN 31* 37* 23  ?CREATININE 0.86 1.09* 0.95  ?CALCIUM 8.1* 8.1* 8.8*  ?GFRNONAA >60 52* >60  ?PROT 4.8* 4.7* 5.9*  ?ALBUMIN 2.7* 2.7* 3.3*  ?AST 57* 123* 11*  ?ALT 122* 332* 30  ?ALKPHOS 42 48 69  ?BILITOT 0.7 0.9 0.5  ? ? ?RADIOGRAPHIC STUDIES: ?I have personally reviewed the radiological images as listed and agreed with the findings in the report. ?CT Head Wo Contrast ? ?Result Date: 03/05/2022 ?CLINICAL DATA:  Altered mental status EXAM: CT HEAD WITHOUT CONTRAST TECHNIQUE: Contiguous  axial images were obtained from the base of the skull through the vertex without intravenous contrast. RADIATION DOSE REDUCTION: This exam was performed according to the departmental dose-optimization program which includes automated exposure control, adjustment of the mA and/or kV according to patient size and/or use of iterative reconstruction technique. COMPARISON:  02/14/2022 MRI, correlation is also made with CT head 01/02/2022 FINDINGS: Brain: Likely further interval increase in the size of the mass spanning the bilateral frontal lobes, which measures approximately 5.3 x 5.4 x 4.8 cm (series 3, image 17 and series 4, image 24), previously 3.2 x 4.1 x 4.1 cm when remeasured similarly on the 02/14/2022 MRI, although comparison is limited by the absence of intravenous contrast on the current study and differences in modality. Increased surrounding hypodensity, likely edema. Increased mass effect on the frontal horns of the lateral ventricles. Additional previously noted lesion in the right basal ganglia has also likely increased in size (series 3, image 17) No definite acute infarct, hemorrhage, hydrocephalus, or extra-axial collection. Vascular: No hyperdense vessel. Skull: Right occipital craniotomy. Left  frontal burr hole. Hyperostosis frontalis. No acute osseous abnormality. Sinuses/Orbits: No acute finding. Status post bilateral lens replacements. Other: The mastoids are well aerated. IMPRESSION: 1. Suspect significant increase in the size of the mass spanning the bilateral frontal lobes, when accounting for the absence of intravenous contrast and differences in technique between the current study and the prior MRI. The right basal ganglia lesion has likely also increased in size. Consider MRI with and without contrast for better comparison. 2. No other acute intracranial process. Electronically Signed   By: Merilyn Baba M.D.   On: 03/05/2022 19:35  ? ?MR Brain W and Wo Contrast ? ?Result Date:  03/05/2022 ?CLINICAL DATA:  Follow-up examination for CNS neoplasm. EXAM: MRI HEAD WITHOUT AND WITH CONTRAST TECHNIQUE: Multiplanar, multiecho pulse sequences of the brain and surrounding structures were obtained without a

## 2022-04-01 DIAGNOSIS — Z5111 Encounter for antineoplastic chemotherapy: Secondary | ICD-10-CM | POA: Diagnosis not present

## 2022-04-01 DIAGNOSIS — R309 Painful micturition, unspecified: Secondary | ICD-10-CM | POA: Diagnosis not present

## 2022-04-01 DIAGNOSIS — C8589 Other specified types of non-Hodgkin lymphoma, extranodal and solid organ sites: Secondary | ICD-10-CM | POA: Diagnosis not present

## 2022-04-01 DIAGNOSIS — Z5112 Encounter for antineoplastic immunotherapy: Secondary | ICD-10-CM | POA: Diagnosis not present

## 2022-04-01 LAB — COMPREHENSIVE METABOLIC PANEL
ALT: 19 U/L (ref 0–44)
AST: 17 U/L (ref 15–41)
Albumin: 2.5 g/dL — ABNORMAL LOW (ref 3.5–5.0)
Alkaline Phosphatase: 68 U/L (ref 38–126)
Anion gap: 5 (ref 5–15)
BUN: 17 mg/dL (ref 8–23)
CO2: 35 mmol/L — ABNORMAL HIGH (ref 22–32)
Calcium: 8.1 mg/dL — ABNORMAL LOW (ref 8.9–10.3)
Chloride: 100 mmol/L (ref 98–111)
Creatinine, Ser: 0.84 mg/dL (ref 0.44–1.00)
GFR, Estimated: >60 mL/min (ref >60)
Glucose, Bld: 143 mg/dL — ABNORMAL HIGH (ref 70–99)
Potassium: 3.5 mmol/L (ref 3.5–5.1)
Sodium: 140 mmol/L (ref 135–145)
Total Bilirubin: 0.5 mg/dL (ref 0.3–1.2)
Total Protein: 5 g/dL — ABNORMAL LOW (ref 6.5–8.1)

## 2022-04-01 LAB — URINALYSIS, COMPLETE (UACMP) WITH MICROSCOPIC
Bilirubin Urine: NEGATIVE
Glucose, UA: NEGATIVE mg/dL
Ketones, ur: NEGATIVE mg/dL
Nitrite: NEGATIVE
Protein, ur: NEGATIVE mg/dL
Specific Gravity, Urine: 1.005 (ref 1.005–1.030)
WBC, UA: >50 WBC/hpf — ABNORMAL HIGH (ref 0–5)
pH: 9 — ABNORMAL HIGH (ref 5.0–8.0)

## 2022-04-01 LAB — CBC WITH DIFFERENTIAL/PLATELET
Abs Immature Granulocytes: 0.64 10*3/uL — ABNORMAL HIGH (ref 0.00–0.07)
Basophils Absolute: 0 10*3/uL (ref 0.0–0.1)
Basophils Relative: 0 %
Eosinophils Absolute: 0.2 10*3/uL (ref 0.0–0.5)
Eosinophils Relative: 2 %
HCT: 32.4 % — ABNORMAL LOW (ref 36.0–46.0)
Hemoglobin: 10.2 g/dL — ABNORMAL LOW (ref 12.0–15.0)
Immature Granulocytes: 7 %
Lymphocytes Relative: 27 %
Lymphs Abs: 2.5 10*3/uL (ref 0.7–4.0)
MCH: 28.6 pg (ref 26.0–34.0)
MCHC: 31.5 g/dL (ref 30.0–36.0)
MCV: 90.8 fL (ref 80.0–100.0)
Monocytes Absolute: 0.7 10*3/uL (ref 0.1–1.0)
Monocytes Relative: 8 %
Neutro Abs: 5.2 10*3/uL (ref 1.7–7.7)
Neutrophils Relative %: 56 %
Platelets: 199 10*3/uL (ref 150–400)
RBC: 3.57 MIL/uL — ABNORMAL LOW (ref 3.87–5.11)
RDW: 15.7 % — ABNORMAL HIGH (ref 11.5–15.5)
WBC: 9.4 10*3/uL (ref 4.0–10.5)
nRBC: 0 % (ref 0.0–0.2)

## 2022-04-01 MED ORDER — SODIUM CHLORIDE 0.9 % IV SOLN
1.0000 g | INTRAVENOUS | Status: DC
  Administered 2022-04-01 – 2022-04-05 (×5): 1 g via INTRAVENOUS
  Filled 2022-04-01 (×5): qty 10

## 2022-04-01 MED ORDER — METHOTREXATE SODIUM (PF) CHEMO INJECTION 1 GM/40ML: 3.5000 g/m2 | Freq: Once | INTRAVENOUS | Status: DC

## 2022-04-01 MED ORDER — CHLORHEXIDINE GLUCONATE CLOTH 2 % EX PADS
6.0000 | MEDICATED_PAD | Freq: Every day | CUTANEOUS | Status: DC
  Administered 2022-04-01 – 2022-04-07 (×7): 6 via TOPICAL

## 2022-04-01 MED ORDER — METHOTREXATE CHEMO INJECTION (PF) 1 GM **50 MG/ML**
7.0000 g | Freq: Once | INTRAVENOUS | Status: AC
  Administered 2022-04-01: 7 g via INTRAVENOUS
  Filled 2022-04-01: qty 500

## 2022-04-01 MED ORDER — GERHARDT'S BUTT CREAM
TOPICAL_CREAM | Freq: Two times a day (BID) | CUTANEOUS | Status: DC
  Administered 2022-04-05: 1 via TOPICAL
  Filled 2022-04-01: qty 1

## 2022-04-01 MED ORDER — SODIUM CHLORIDE 0.9 % IV SOLN
Freq: Once | INTRAVENOUS | Status: AC
  Administered 2022-04-01: 36 mg via INTRAVENOUS
  Filled 2022-04-01: qty 4.5

## 2022-04-01 MED ORDER — METHOTREXATE SODIUM (PF) CHEMO INJECTION 1 GM/40ML
3.5000 g/m2 | Freq: Once | INTRAVENOUS | Status: DC
  Filled 2022-04-01: qty 280

## 2022-04-01 NOTE — Consult Note (Addendum)
WOC Nurse Consult Note: ?Reason for Consult: Consult requested for buttocks.  Pt has chronic areas of full thickness skin loss; red and macerated, painful and "shaggy irregular edges" in appearance; she states this has been present since she started chemotherapy.  Appearance is consistent with shear and moisture, no pressure injuries. Location is across bilat buttocks and to inner gluteal fold, approx 4X4X.2cm. ?Dressing procedure/placement/frequency: Topical treatment orders provided for bedside nurses to perform as follows to promote healing: Apply Gerhardts butt cream BID and PRN when turning or cleaning. Leave foam dressing off. ?Please re-consult if further assistance is needed.  Thank-you,  ?Julien Girt MSN, RN, [place], [place], CNS ?[phone]  ? ?  ?

## 2022-04-01 NOTE — Progress Notes (Signed)
[facility] ?Neuro-Oncology Note ? ?Patient Care Team: ?Karleen Hampshire., MD as PCP - General (Internal Medicine) ? ?CHIEF COMPLAINTS/PURPOSE OF CONSULTATION:  ?Primary CNS Lymphoma ? ?Oncology History  ?CNS lymphoma ([place])  ?01/02/2022 Cancer Staging  ? Staging form: Brain and Spinal Cord, AJCC Version 9 ?- Clinical stage from 01/02/2022: WHO G4 - Signed by Ventura Sellers, MD on 01/27/2022 ?Histopathologic type: Malignant lymphoma, large B-cell, diffuse, NOS ?Stage prefix: Initial diagnosis ?Histologic grading system: 4 grade system ? ?  ?01/27/2022 Initial Diagnosis  ? CNS lymphoma ([place]) ?  ?03/11/2022 -  Chemotherapy  ? Patient is on Treatment Plan : IP NON-HODGKINS LYMPHOMA High Dose Methotrexate + Leucovorin Rescue  ?   ?03/13/2022 -  Chemotherapy  ? Patient is on Treatment Plan : NON-HODGKINS LYMPHOMA Rituximab q21d  ?   ? ? ?INTERVAL HISTORY:  ?Isabel Gibbs is resting comfortably.  No novel neurologic complaints, though does acknowledge mild congestion and pressure with urination.  Plan is for methotrexate administration today. ? ? ?MEDICAL HISTORY:  ?Past Medical History:  ?Diagnosis Date  ? Hypertension   ? Prediabetes   ? 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 ? ?SURGICAL HISTORY: ?Past Surgical History:  ?Procedure Laterality Date  ? APPLICATION OF CRANIAL NAVIGATION Left 01/02/2022  ? Procedure: APPLICATION OF CRANIAL NAVIGATION;  Surgeon: Judith Part, MD;  Location: [facility];  Service: Neurosurgery;  Laterality: Left;  ? CEREBRAL MICROVASCULAR DECOMPRESSION Right 08/02/2018  ? [place]  ? FRAMELESS  BIOPSY WITH BRAINLAB Left 01/02/2022  ? Procedure: LEFT BRAIN  BIOPSY WITH Lucky Rathke;  Surgeon: Judith Part, MD;  Location: [facility];  Service: Neurosurgery;  Laterality: Left;  ? IR IMAGING GUIDED PORT INSERTION  03/04/2022  ? RADIOLOGY WITH ANESTHESIA N/A 01/01/2022  ? Procedure: MRI of Brain with and without contrast;  Surgeon: Radiologist, Medication, MD;  Location: [facility];  Service: Radiology;   Laterality: N/A;  ? ? ?SOCIAL HISTORY: ?Social History  ? ?Socioeconomic History  ? Marital status: Widowed  ?  Spouse name: Not on file  ? Number of children: Not on file  ? Years of education: Not on file  ? Highest education level: Not on file  ?Occupational History  ? Not on file  ?Tobacco Use  ? Smoking status: Former  ?  Types: Cigarettes  ?  Quit date: 2000  ?  Years since quitting: 23.2  ? Smokeless tobacco: Never  ?Vaping Use  ? Vaping Use: Never used  ?Substance and Sexual Activity  ? Alcohol use: Never  ? Drug use: Never  ? Sexual activity: Not on file  ?Other Topics Concern  ? Not on file  ?Social History Narrative  ? Not on file  ? ?Social Determinants of Health  ? ?Financial Resource Strain: Not on file  ?Food Insecurity: Not on file  ?Transportation Needs: Not on file  ?Physical Activity: Not on file  ?Stress: Not on file  ?Social Connections: Not on file  ?Intimate Partner Violence: Not on file  ? ? ?FAMILY HISTORY: ?Family History  ?Problem Relation Age of Onset  ? Heart failure Brother   ? Diabetes Brother   ? Leukemia Brother   ? ? ?ALLERGIES:  is allergic to codeine, lisinopril, and hydrocodone-acetaminophen. ? ?MEDICATIONS:  ?Current Facility-Administered Medications  ?Medication Dose Route Frequency Provider Last Rate Last Admin  ? acetaminophen (TYLENOL) tablet 650 mg  650 mg Oral Q4H PRN Ventura Sellers, MD   650 mg at 04/01/22 1152  ?  alum & mag hydroxide-simeth (MAALOX/MYLANTA) 200-200-20 MG/5ML suspension 60 mL  60 mL Oral Q4H PRN Zakhari Fogel, Acey Lav, MD      ? Chlorhexidine Gluconate Cloth 2 % PADS 6 each  6 each Topical Daily Ventura Sellers, MD   6 each at 04/01/22 [phone]  ? enoxaparin (LOVENOX) injection 40 mg  40 mg Subcutaneous Q24H Ventura Sellers, MD   40 mg at 03/31/22 1151  ? gabapentin (NEURONTIN) capsule 900 mg  900 mg Oral BID Ventura Sellers, MD   900 mg at 03/31/22 2110  ? Gerhardt's butt cream   Topical BID Ventura Sellers, MD   Given at 04/01/22 0935  ?  guaiFENesin-dextromethorphan (ROBITUSSIN DM) 100-10 MG/5ML syrup 10 mL  10 mL Oral Q4H PRN Ventura Sellers, MD      ? irbesartan (AVAPRO) tablet 150 mg  150 mg Oral QHS Ventura Sellers, MD   150 mg at 03/31/22 2110  ? Methotrexate 7g ('50mg'$ /ml) in dextrose 5 % 571m chemo infusion  7 g Intravenous Once Makalya Nave, ZAcey Lav MD      ? ondansetron (ZOFRAN) tablet 4-8 mg  4-8 mg Oral Q8H PRN Tyquon Near, ZAcey Lav MD      ? Or  ? ondansetron (ZOFRAN-ODT) disintegrating tablet 4-8 mg  4-8 mg Oral Q8H PRN Willis Kuipers, ZAcey Lav MD      ? Or  ? ondansetron (ZOFRAN) injection 4 mg  4 mg Intravenous Q8H PRN Gaylia Kassel, ZAcey Lav MD      ? Or  ? ondansetron (ZOFRAN) 8 mg in sodium chloride 0.9 % 50 mL IVPB  8 mg Intravenous Q8H PRN Brenner Visconti, ZAcey Lav MD      ? senna-docusate (Senokot-S) tablet 1 tablet  1 tablet Oral QHS PRN VVentura Sellers MD      ? sodium bicarbonate 150 mEq in dextrose 5 % 1,150 mL infusion   Intravenous Continuous VVentura Sellers MD 125 mL/hr at 04/01/22 0935 New Bag at 04/01/22 0935  ? ? ?REVIEW OF SYSTEMS:   ?Constitutional: Denies fevers, chills or abnormal weight loss ?Eyes: Denies blurriness of vision ?Ears, nose, mouth, throat, and face: Denies mucositis or sore throat ?Respiratory: Denies cough, dyspnea or wheezes ?Cardiovascular: Denies palpitation, chest discomfort or lower extremity swelling ?Gastrointestinal:  Denies nausea, constipation, diarrhea ?GU: Denies dysuria or incontinence ?Skin: Denies abnormal skin rashes ?Neurological: Per HPI ?Musculoskeletal: Denies joint pain, back or neck discomfort. No decrease in ROM ?Behavioral/Psych: Denies anxiety, disturbance in thought content, and mood instability ? ? ?PHYSICAL EXAMINATION: ?Vitals:  ? 04/01/22 0507 04/01/22 1406  ?BP: (!) 109/59 118/60  ?Pulse: 79 75  ?Resp: 14 18  ?Temp: 98.8 ?F (37.1 ?C) 99.2 ?F (37.3 ?C)  ?SpO2: 95% 91%  ? ? ?KPS: 60. ?General: Alert, cooperative, pleasant, in no acute distress ?Head: Craniotomy scar noted, dry and  intact. ?EENT: No conjunctival injection or scleral icterus. Oral mucosa moist ?Lungs: Resp effort normal ?Cardiac: Regular rate and rhythm ?Abdomen: Soft, non-distended abdomen ?Skin: No rashes cyanosis or petechiae. ?Extremities: No clubbing or edema ? ?NEUROLOGIC EXAM: ?MMental Status: Awake, but drowsy, attentive to examiner only with stimulation. Oriented to self and environment. Language is impaired with regards to fluency, comprehension intact to multi step.  ?Cranial Nerves: Visual acuity is grossly normal. Visual fields are full. Extra-ocular movements intact. No ptosis. Face is symmetric ?Motor: Tone and bulk are normal. Power is noted 4/5 in right arm and leg. Reflexes are symmetric, no pathologic reflexes present.  ?Sensory: Intact to light  touch ?Gait: Hemiparetic ? ? ?LABORATORY DATA:  ?I have reviewed the data as listed ?Lab Results  ?Component Value Date  ? WBC 9.4 04/01/2022  ? HGB 10.2 (L) 04/01/2022  ? HCT 32.4 (L) 04/01/2022  ? MCV 90.8 04/01/2022  ? PLT 199 04/01/2022  ? ?Recent Labs  ?  03/25/22 ?1141 03/31/22 ?1142 04/01/22 ?0450  ?NA 138 138 140  ?K 4.3 4.2 3.5  ?CL 101 102 100  ?CO2 32 30 35*  ?GLUCOSE 210* 135* 143*  ?BUN '23 21 17  '$ ?CREATININE 0.95 0.90 0.84  ?CALCIUM 8.8* 8.4* 8.1*  ?GFRNONAA >60 >60 >60  ?PROT 5.9* 5.7* 5.0*  ?ALBUMIN 3.3* 2.8* 2.5*  ?AST 11* 17 17  ?ALT '30 20 19  '$ ?ALKPHOS 69 77 68  ?BILITOT 0.5 0.5 0.5  ? ? ?RADIOGRAPHIC STUDIES: ?I have personally reviewed the radiological images as listed and agreed with the findings in the report. ?CT Head Wo Contrast ? ?Result Date: 03/05/2022 ?CLINICAL DATA:  Altered mental status EXAM: CT HEAD WITHOUT CONTRAST TECHNIQUE: Contiguous axial images were obtained from the base of the skull through the vertex without intravenous contrast. RADIATION DOSE REDUCTION: This exam was performed according to the departmental dose-optimization program which includes automated exposure control, adjustment of the mA and/or kV according to patient  size and/or use of iterative reconstruction technique. COMPARISON:  02/14/2022 MRI, correlation is also made with CT head 01/02/2022 FINDINGS: Brain: Likely further interval increase in the size of the mass spanning the

## 2022-04-02 DIAGNOSIS — C8589 Other specified types of non-Hodgkin lymphoma, extranodal and solid organ sites: Secondary | ICD-10-CM | POA: Diagnosis not present

## 2022-04-02 LAB — CBC WITH DIFFERENTIAL/PLATELET
Abs Immature Granulocytes: 0.32 10*3/uL — ABNORMAL HIGH (ref 0.00–0.07)
Basophils Absolute: 0 10*3/uL (ref 0.0–0.1)
Basophils Relative: 0 %
Eosinophils Absolute: 0 10*3/uL (ref 0.0–0.5)
Eosinophils Relative: 0 %
HCT: 30.8 % — ABNORMAL LOW (ref 36.0–46.0)
Hemoglobin: 10 g/dL — ABNORMAL LOW (ref 12.0–15.0)
Immature Granulocytes: 3 %
Lymphocytes Relative: 14 %
Lymphs Abs: 1.6 10*3/uL (ref 0.7–4.0)
MCH: 29.3 pg (ref 26.0–34.0)
MCHC: 32.5 g/dL (ref 30.0–36.0)
MCV: 90.3 fL (ref 80.0–100.0)
Monocytes Absolute: 0.5 10*3/uL (ref 0.1–1.0)
Monocytes Relative: 4 %
Neutro Abs: 8.7 10*3/uL — ABNORMAL HIGH (ref 1.7–7.7)
Neutrophils Relative %: 79 %
Platelets: 211 10*3/uL (ref 150–400)
RBC: 3.41 MIL/uL — ABNORMAL LOW (ref 3.87–5.11)
RDW: 14.9 % (ref 11.5–15.5)
WBC: 11.1 10*3/uL — ABNORMAL HIGH (ref 4.0–10.5)
nRBC: 0 % (ref 0.0–0.2)

## 2022-04-02 LAB — COMPREHENSIVE METABOLIC PANEL
ALT: 24 U/L (ref 0–44)
AST: 21 U/L (ref 15–41)
Albumin: 2.4 g/dL — ABNORMAL LOW (ref 3.5–5.0)
Alkaline Phosphatase: 65 U/L (ref 38–126)
Anion gap: 8 (ref 5–15)
BUN: 17 mg/dL (ref 8–23)
CO2: 36 mmol/L — ABNORMAL HIGH (ref 22–32)
Calcium: 7.7 mg/dL — ABNORMAL LOW (ref 8.9–10.3)
Chloride: 97 mmol/L — ABNORMAL LOW (ref 98–111)
Creatinine, Ser: 0.99 mg/dL (ref 0.44–1.00)
GFR, Estimated: 58 mL/min — ABNORMAL LOW (ref >60)
Glucose, Bld: 216 mg/dL — ABNORMAL HIGH (ref 70–99)
Potassium: 3.7 mmol/L (ref 3.5–5.1)
Sodium: 141 mmol/L (ref 135–145)
Total Bilirubin: 0.6 mg/dL (ref 0.3–1.2)
Total Protein: 5 g/dL — ABNORMAL LOW (ref 6.5–8.1)

## 2022-04-02 LAB — URINALYSIS, COMPLETE (UACMP) WITH MICROSCOPIC
Bilirubin Urine: NEGATIVE
Glucose, UA: 150 mg/dL — AB
Hgb urine dipstick: NEGATIVE
Ketones, ur: NEGATIVE mg/dL
Nitrite: NEGATIVE
Protein, ur: 100 mg/dL — AB
Specific Gravity, Urine: 1.011 (ref 1.005–1.030)
WBC, UA: >50 WBC/hpf — ABNORMAL HIGH (ref 0–5)
pH: 7 (ref 5.0–8.0)

## 2022-04-02 MED ORDER — SODIUM CHLORIDE 0.9 % IV SOLN
Freq: Once | INTRAVENOUS | Status: AC
  Administered 2022-04-02: 6 mg via INTRAVENOUS
  Filled 2022-04-02: qty 8

## 2022-04-02 MED ORDER — LEUCOVORIN CALCIUM INJECTION 100 MG
15.0000 mg | Freq: Four times a day (QID) | INTRAMUSCULAR | Status: AC
  Administered 2022-04-02 – 2022-04-03 (×4): 16 mg via INTRAVENOUS
  Filled 2022-04-02 (×6): qty 0.8

## 2022-04-02 NOTE — Progress Notes (Signed)
[facility] ?Neuro-Oncology Note ? ?Patient Care Team: ?Karleen Hampshire., MD as PCP - General (Internal Medicine) ? ?CHIEF COMPLAINTS/PURPOSE OF CONSULTATION:  ?Primary CNS Lymphoma ? ?Oncology History  ?CNS lymphoma ([place])  ?01/02/2022 Cancer Staging  ? Staging form: Brain and Spinal Cord, AJCC Version 9 ?- Clinical stage from 01/02/2022: WHO G4 - Signed by Ventura Sellers, MD on 01/27/2022 ?Histopathologic type: Malignant lymphoma, large B-cell, diffuse, NOS ?Stage prefix: Initial diagnosis ?Histologic grading system: 4 grade system ? ?  ?01/27/2022 Initial Diagnosis  ? CNS lymphoma ([place]) ?  ?03/11/2022 -  Chemotherapy  ? Patient is on Treatment Plan : IP NON-HODGKINS LYMPHOMA High Dose Methotrexate + Leucovorin Rescue  ?   ?03/13/2022 -  Chemotherapy  ? Patient is on Treatment Plan : NON-HODGKINS LYMPHOMA Rituximab q21d  ?   ? ? ?INTERVAL HISTORY:  ?Isabel Gibbs is resting comfortably.  No complaints today.  Did well with chemo yesterday. ? ? ?MEDICAL HISTORY:  ?Past Medical History:  ?Diagnosis Date  ? Hypertension   ? Prediabetes   ? 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 ? ?SURGICAL HISTORY: ?Past Surgical History:  ?Procedure Laterality Date  ? APPLICATION OF CRANIAL NAVIGATION Left 01/02/2022  ? Procedure: APPLICATION OF CRANIAL NAVIGATION;  Surgeon: Judith Part, MD;  Location: [facility];  Service: Neurosurgery;  Laterality: Left;  ? CEREBRAL MICROVASCULAR DECOMPRESSION Right 08/02/2018  ? [place]  ? FRAMELESS  BIOPSY WITH BRAINLAB Left 01/02/2022  ? Procedure: LEFT BRAIN  BIOPSY WITH Lucky Rathke;  Surgeon: Judith Part, MD;  Location: [facility];  Service: Neurosurgery;  Laterality: Left;  ? IR IMAGING GUIDED PORT INSERTION  03/04/2022  ? RADIOLOGY WITH ANESTHESIA N/A 01/01/2022  ? Procedure: MRI of Brain with and without contrast;  Surgeon: Radiologist, Medication, MD;  Location: [facility];  Service: Radiology;  Laterality: N/A;  ? ? ?SOCIAL HISTORY: ?Social History  ? ?Socioeconomic History  ? Marital  status: Widowed  ?  Spouse name: Not on file  ? Number of children: Not on file  ? Years of education: Not on file  ? Highest education level: Not on file  ?Occupational History  ? Not on file  ?Tobacco Use  ? Smoking status: Former  ?  Types: Cigarettes  ?  Quit date: 2000  ?  Years since quitting: 23.2  ? Smokeless tobacco: Never  ?Vaping Use  ? Vaping Use: Never used  ?Substance and Sexual Activity  ? Alcohol use: Never  ? Drug use: Never  ? Sexual activity: Not on file  ?Other Topics Concern  ? Not on file  ?Social History Narrative  ? Not on file  ? ?Social Determinants of Health  ? ?Financial Resource Strain: Not on file  ?Food Insecurity: Not on file  ?Transportation Needs: Not on file  ?Physical Activity: Not on file  ?Stress: Not on file  ?Social Connections: Not on file  ?Intimate Partner Violence: Not on file  ? ? ?FAMILY HISTORY: ?Family History  ?Problem Relation Age of Onset  ? Heart failure Brother   ? Diabetes Brother   ? Leukemia Brother   ? ? ?ALLERGIES:  is allergic to codeine, lisinopril, and hydrocodone-acetaminophen. ? ?MEDICATIONS:  ?Current Facility-Administered Medications  ?Medication Dose Route Frequency Provider Last Rate Last Admin  ? acetaminophen (TYLENOL) tablet 650 mg  650 mg Oral Q4H PRN Ventura Sellers, MD   650 mg at 04/01/22 1152  ? alum & mag hydroxide-simeth (MAALOX/MYLANTA) 200-200-20 MG/5ML suspension 60 mL  60 mL Oral Q4H PRN Ventura Sellers, MD      ? cefTRIAXone (ROCEPHIN) 1 g in sodium chloride 0.9 % 100 mL IVPB  1 g Intravenous Q24H Ventura Sellers, MD   Stopped at 04/01/22 2044  ? Chlorhexidine Gluconate Cloth 2 % PADS 6 each  6 each Topical Daily Ventura Sellers, MD   6 each at 04/02/22 1025  ? enoxaparin (LOVENOX) injection 40 mg  40 mg Subcutaneous Q24H Ventura Sellers, MD   40 mg at 04/02/22 1247  ? gabapentin (NEURONTIN) capsule 900 mg  900 mg Oral BID Ventura Sellers, MD   900 mg at 04/02/22 1459  ? Gerhardt's butt cream   Topical BID Ventura Sellers, MD   Given at 04/02/22 1025  ? guaiFENesin-dextromethorphan (ROBITUSSIN DM) 100-10 MG/5ML syrup 10 mL  10 mL Oral Q4H PRN Ventura Sellers, MD      ? irbesartan (AVAPRO) tablet 150 mg  150 mg Oral QHS Ventura Sellers, MD   150 mg at 04/01/22 2153  ? leucovorin injection 16 mg  16 mg Intravenous Q6H Ventura Sellers, MD   16 mg at 04/02/22 1504  ? ondansetron (ZOFRAN) tablet 4-8 mg  4-8 mg Oral Q8H PRN Shayne Deerman, Acey Lav, MD      ? Or  ? ondansetron (ZOFRAN-ODT) disintegrating tablet 4-8 mg  4-8 mg Oral Q8H PRN Marithza Malachi, Acey Lav, MD      ? Or  ? ondansetron (ZOFRAN) injection 4 mg  4 mg Intravenous Q8H PRN Simmie Garin, Acey Lav, MD      ? Or  ? ondansetron (ZOFRAN) 8 mg in sodium chloride 0.9 % 50 mL IVPB  8 mg Intravenous Q8H PRN Susie Ehresman, Acey Lav, MD      ? senna-docusate (Senokot-S) tablet 1 tablet  1 tablet Oral QHS PRN Ventura Sellers, MD      ? sodium bicarbonate 150 mEq in dextrose 5 % 1,150 mL infusion   Intravenous Continuous Ventura Sellers, MD 100 mL/hr at 04/02/22 0305 Infusion Verify at 04/02/22 0305  ? ? ?REVIEW OF SYSTEMS:   ?Constitutional: Denies fevers, chills or abnormal weight loss ?Eyes: Denies blurriness of vision ?Ears, nose, mouth, throat, and face: Denies mucositis or sore throat ?Respiratory: Denies cough, dyspnea or wheezes ?Cardiovascular: Denies palpitation, chest discomfort or lower extremity swelling ?Gastrointestinal:  Denies nausea, constipation, diarrhea ?GU: Denies dysuria or incontinence ?Skin: Denies abnormal skin rashes ?Neurological: Per HPI ?Musculoskeletal: Denies joint pain, back or neck discomfort. No decrease in ROM ?Behavioral/Psych: Denies anxiety, disturbance in thought content, and mood instability ? ? ?PHYSICAL EXAMINATION: ?Vitals:  ? 04/02/22 0510 04/02/22 1345  ?BP: (!) 108/46 114/66  ?Pulse: 73 62  ?Resp: 17 18  ?Temp: (!) 97.4 ?F (36.3 ?C) 98 ?F (36.7 ?C)  ?SpO2: 94% 93%  ? ? ?KPS: 60. ?General: Alert, cooperative, pleasant, in no acute distress ?Head:  Craniotomy scar noted, dry and intact. ?EENT: No conjunctival injection or scleral icterus. Oral mucosa moist ?Lungs: Resp effort normal ?Cardiac: Regular rate and rhythm ?Abdomen: Soft, non-distended abdomen ?Skin: No rashes cyanosis or petechiae. ?Extremities: No clubbing or edema ? ?NEUROLOGIC EXAM: ?MMental Status: Awake, but drowsy, attentive to examiner only with stimulation. Oriented to self and environment. Language is impaired with regards to fluency, comprehension intact to multi step.  ?Cranial Nerves: Visual acuity is grossly normal. Visual fields are full. Extra-ocular movements intact. No ptosis. Face is symmetric ?Motor: Tone and bulk are normal. Power is noted 4/5 in  right arm and leg. Reflexes are symmetric, no pathologic reflexes present.  ?Sensory: Intact to light touch ?Gait: Hemiparetic ? ? ?LABORATORY DATA:  ?I have reviewed the data as listed ?Lab Results  ?Component Value Date  ? WBC 11.1 (H) 04/02/2022  ? HGB 10.0 (L) 04/02/2022  ? HCT 30.8 (L) 04/02/2022  ? MCV 90.3 04/02/2022  ? PLT 211 04/02/2022  ? ?Recent Labs  ?  03/31/22 ?1142 04/01/22 ?0450 04/02/22 ?0512  ?NA 138 140 141  ?K 4.2 3.5 3.7  ?CL 102 100 97*  ?CO2 30 35* 36*  ?GLUCOSE 135* 143* 216*  ?BUN '21 17 17  '$ ?CREATININE 0.90 0.84 0.99  ?CALCIUM 8.4* 8.1* 7.7*  ?GFRNONAA >60 >60 58*  ?PROT 5.7* 5.0* 5.0*  ?ALBUMIN 2.8* 2.5* 2.4*  ?AST '17 17 21  '$ ?ALT '20 19 24  '$ ?ALKPHOS 77 68 65  ?BILITOT 0.5 0.5 0.6  ? ? ?RADIOGRAPHIC STUDIES: ?I have personally reviewed the radiological images as listed and agreed with the findings in the report. ?CT Head Wo Contrast ? ?Result Date: 03/05/2022 ?CLINICAL DATA:  Altered mental status EXAM: CT HEAD WITHOUT CONTRAST TECHNIQUE: Contiguous axial images were obtained from the base of the skull through the vertex without intravenous contrast. RADIATION DOSE REDUCTION: This exam was performed according to the departmental dose-optimization program which includes automated exposure control, adjustment of the  mA and/or kV according to patient size and/or use of iterative reconstruction technique. COMPARISON:  02/14/2022 MRI, correlation is also made with CT head 01/02/2022 FINDINGS: Brain: Likely further interval in

## 2022-04-02 NOTE — Progress Notes (Signed)
?  Transition of Care (TOC) Screening Note ? ? ?Patient Details  ?Name: Isabel Gibbs ?Date of Birth: 1946/07/04 ? ? ?Transition of Care (TOC) CM/SW Contact:    ?Mcdonald Reiling, Marjie Skiff, RN ?Phone Number: ?04/02/2022, 12:54 PM ? ? ? ?Transition of Care Department [facility]) has reviewed patient and no TOC needs have been identified at this time. We will continue to monitor patient advancement through interdisciplinary progression rounds. If new patient transition needs arise, please place a TOC consult. ?  ?

## 2022-04-03 ENCOUNTER — Encounter: Payer: Self-pay | Admitting: Internal Medicine

## 2022-04-03 DIAGNOSIS — C8589 Other specified types of non-Hodgkin lymphoma, extranodal and solid organ sites: Secondary | ICD-10-CM | POA: Diagnosis not present

## 2022-04-03 LAB — URINALYSIS, COMPLETE (UACMP) WITH MICROSCOPIC
Bilirubin Urine: NEGATIVE
Glucose, UA: 150 mg/dL — AB
Ketones, ur: NEGATIVE mg/dL
Nitrite: NEGATIVE
Protein, ur: NEGATIVE mg/dL
Specific Gravity, Urine: 1.008 (ref 1.005–1.030)
WBC, UA: >50 WBC/hpf — ABNORMAL HIGH (ref 0–5)
pH: 9 — ABNORMAL HIGH (ref 5.0–8.0)

## 2022-04-03 LAB — COMPREHENSIVE METABOLIC PANEL
ALT: 51 U/L — ABNORMAL HIGH (ref 0–44)
AST: 42 U/L — ABNORMAL HIGH (ref 15–41)
Albumin: 2.5 g/dL — ABNORMAL LOW (ref 3.5–5.0)
Alkaline Phosphatase: 61 U/L (ref 38–126)
Anion gap: 7 (ref 5–15)
BUN: 25 mg/dL — ABNORMAL HIGH (ref 8–23)
CO2: 38 mmol/L — ABNORMAL HIGH (ref 22–32)
Calcium: 7.8 mg/dL — ABNORMAL LOW (ref 8.9–10.3)
Chloride: 96 mmol/L — ABNORMAL LOW (ref 98–111)
Creatinine, Ser: 0.91 mg/dL (ref 0.44–1.00)
GFR, Estimated: >60 mL/min (ref >60)
Glucose, Bld: 191 mg/dL — ABNORMAL HIGH (ref 70–99)
Potassium: 3.5 mmol/L (ref 3.5–5.1)
Sodium: 141 mmol/L (ref 135–145)
Total Bilirubin: 0.2 mg/dL — ABNORMAL LOW (ref 0.3–1.2)
Total Protein: 5.2 g/dL — ABNORMAL LOW (ref 6.5–8.1)

## 2022-04-03 LAB — URINE CULTURE

## 2022-04-03 LAB — CBC WITH DIFFERENTIAL/PLATELET
Abs Immature Granulocytes: 0.11 10*3/uL — ABNORMAL HIGH (ref 0.00–0.07)
Basophils Absolute: 0 10*3/uL (ref 0.0–0.1)
Basophils Relative: 0 %
Eosinophils Absolute: 0 10*3/uL (ref 0.0–0.5)
Eosinophils Relative: 0 %
HCT: 30.6 % — ABNORMAL LOW (ref 36.0–46.0)
Hemoglobin: 10 g/dL — ABNORMAL LOW (ref 12.0–15.0)
Immature Granulocytes: 1 %
Lymphocytes Relative: 15 %
Lymphs Abs: 1.7 10*3/uL (ref 0.7–4.0)
MCH: 29.6 pg (ref 26.0–34.0)
MCHC: 32.7 g/dL (ref 30.0–36.0)
MCV: 90.5 fL (ref 80.0–100.0)
Monocytes Absolute: 0.4 10*3/uL (ref 0.1–1.0)
Monocytes Relative: 4 %
Neutro Abs: 9.3 10*3/uL — ABNORMAL HIGH (ref 1.7–7.7)
Neutrophils Relative %: 80 %
Platelets: 247 10*3/uL (ref 150–400)
RBC: 3.38 MIL/uL — ABNORMAL LOW (ref 3.87–5.11)
RDW: 14.9 % (ref 11.5–15.5)
WBC: 11.5 10*3/uL — ABNORMAL HIGH (ref 4.0–10.5)
nRBC: 0 % (ref 0.0–0.2)

## 2022-04-03 LAB — METHOTREXATE: Methotrexate: 2.38

## 2022-04-03 MED ORDER — ACETAMINOPHEN 325 MG PO TABS
650.0000 mg | ORAL_TABLET | Freq: Once | ORAL | Status: AC
  Administered 2022-04-03: 650 mg via ORAL
  Filled 2022-04-03: qty 2

## 2022-04-03 MED ORDER — DIPHENHYDRAMINE HCL 25 MG PO CAPS
50.0000 mg | ORAL_CAPSULE | Freq: Once | ORAL | Status: AC
  Administered 2022-04-03: 50 mg via ORAL
  Filled 2022-04-03: qty 2

## 2022-04-03 MED ORDER — SODIUM CHLORIDE 0.9 % IV SOLN
Freq: Once | INTRAVENOUS | Status: AC
  Administered 2022-04-03: 8 mg via INTRAVENOUS
  Filled 2022-04-03: qty 8

## 2022-04-03 MED ORDER — SODIUM CHLORIDE 0.9 % IV SOLN
375.0000 mg/m2 | Freq: Once | INTRAVENOUS | Status: AC
  Administered 2022-04-03: 800 mg via INTRAVENOUS
  Filled 2022-04-03: qty 80

## 2022-04-03 MED ORDER — LEUCOVORIN CALCIUM INJECTION 100 MG
15.0000 mg | Freq: Four times a day (QID) | INTRAMUSCULAR | Status: AC
  Administered 2022-04-03 – 2022-04-04 (×4): 16 mg via INTRAVENOUS
  Filled 2022-04-03 (×4): qty 0.8

## 2022-04-03 NOTE — Progress Notes (Signed)
[facility] ?Neuro-Oncology Note ? ?Patient Care Team: ?Karleen Hampshire., MD as PCP - General (Internal Medicine) ? ?CHIEF COMPLAINTS/PURPOSE OF CONSULTATION:  ?Primary CNS Lymphoma ? ?Oncology History  ?CNS lymphoma ([place])  ?01/02/2022 Cancer Staging  ? Staging form: Brain and Spinal Cord, AJCC Version 9 ?- Clinical stage from 01/02/2022: WHO G4 - Signed by Ventura Sellers, MD on 01/27/2022 ?Histopathologic type: Malignant lymphoma, large B-cell, diffuse, NOS ?Stage prefix: Initial diagnosis ?Histologic grading system: 4 grade system ? ?  ?01/27/2022 Initial Diagnosis  ? CNS lymphoma ([place]) ?  ?03/11/2022 -  Chemotherapy  ? Patient is on Treatment Plan : IP NON-HODGKINS LYMPHOMA High Dose Methotrexate + Leucovorin Rescue  ?   ?03/13/2022 -  Chemotherapy  ? Patient is on Treatment Plan : NON-HODGKINS LYMPHOMA Rituximab q21d  ?   ? ? ?INTERVAL HISTORY:  ?Isabel Gibbs is resting comfortably.  No issues with fluids or methotrexate.  Plan is for rituximab today. ? ? ?MEDICAL HISTORY:  ?Past Medical History:  ?Diagnosis Date  ? Hypertension   ? Prediabetes   ? 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 ? ?SURGICAL HISTORY: ?Past Surgical History:  ?Procedure Laterality Date  ? APPLICATION OF CRANIAL NAVIGATION Left 01/02/2022  ? Procedure: APPLICATION OF CRANIAL NAVIGATION;  Surgeon: Judith Part, MD;  Location: [facility];  Service: Neurosurgery;  Laterality: Left;  ? CEREBRAL MICROVASCULAR DECOMPRESSION Right 08/02/2018  ? [place]  ? FRAMELESS  BIOPSY WITH BRAINLAB Left 01/02/2022  ? Procedure: LEFT BRAIN  BIOPSY WITH Lucky Rathke;  Surgeon: Judith Part, MD;  Location: [place];  Service: Neurosurgery;  Laterality: Left;  ? IR IMAGING GUIDED PORT INSERTION  03/04/2022  ? RADIOLOGY WITH ANESTHESIA N/A 01/01/2022  ? Procedure: MRI of Brain with and without contrast;  Surgeon: Radiologist, Medication, MD;  Location: [facility];  Service: Radiology;  Laterality: N/A;  ? ? ?SOCIAL HISTORY: ?Social History  ? ?Socioeconomic  History  ? Marital status: Widowed  ?  Spouse name: Not on file  ? Number of children: Not on file  ? Years of education: Not on file  ? Highest education level: Not on file  ?Occupational History  ? Not on file  ?Tobacco Use  ? Smoking status: Former  ?  Types: Cigarettes  ?  Quit date: 2000  ?  Years since quitting: 23.2  ? Smokeless tobacco: Never  ?Vaping Use  ? Vaping Use: Never used  ?Substance and Sexual Activity  ? Alcohol use: Never  ? Drug use: Never  ? Sexual activity: Not on file  ?Other Topics Concern  ? Not on file  ?Social History Narrative  ? Not on file  ? ?Social Determinants of Health  ? ?Financial Resource Strain: Not on file  ?Food Insecurity: Not on file  ?Transportation Needs: Not on file  ?Physical Activity: Not on file  ?Stress: Not on file  ?Social Connections: Not on file  ?Intimate Partner Violence: Not on file  ? ? ?FAMILY HISTORY: ?Family History  ?Problem Relation Age of Onset  ? Heart failure Brother   ? Diabetes Brother   ? Leukemia Brother   ? ? ?ALLERGIES:  is allergic to codeine, lisinopril, and hydrocodone-acetaminophen. ? ?MEDICATIONS:  ?Current Facility-Administered Medications  ?Medication Dose Route Frequency Provider Last Rate Last Admin  ? acetaminophen (TYLENOL) tablet 650 mg  650 mg Oral Q4H PRN Ventura Sellers, MD   650 mg at 04/01/22 1152  ? alum & mag hydroxide-simeth (MAALOX/MYLANTA) 200-200-20 MG/5ML  suspension 60 mL  60 mL Oral Q4H PRN Ventura Sellers, MD      ? cefTRIAXone (ROCEPHIN) 1 g in sodium chloride 0.9 % 100 mL IVPB  1 g Intravenous Q24H Ventura Sellers, MD 200 mL/hr at 04/02/22 1633 1 g at 04/02/22 1633  ? Chlorhexidine Gluconate Cloth 2 % PADS 6 each  6 each Topical Daily Ventura Sellers, MD   6 each at 04/02/22 1025  ? gabapentin (NEURONTIN) capsule 900 mg  900 mg Oral BID Ventura Sellers, MD   900 mg at 04/02/22 2101  ? Gerhardt's butt cream   Topical BID Ventura Sellers, MD   Given at 04/02/22 2103  ? guaiFENesin-dextromethorphan  (ROBITUSSIN DM) 100-10 MG/5ML syrup 10 mL  10 mL Oral Q4H PRN Ceniyah Thorp, Acey Lav, MD      ? irbesartan (AVAPRO) tablet 150 mg  150 mg Oral QHS Ventura Sellers, MD   150 mg at 04/02/22 2101  ? leucovorin injection 16 mg  16 mg Intravenous Q6H Alfonsa Vaile, Acey Lav, MD      ? ondansetron (ZOFRAN) tablet 4-8 mg  4-8 mg Oral Q8H PRN Ventura Sellers, MD      ? Or  ? ondansetron (ZOFRAN-ODT) disintegrating tablet 4-8 mg  4-8 mg Oral Q8H PRN Daden Mahany, Acey Lav, MD      ? Or  ? ondansetron (ZOFRAN) injection 4 mg  4 mg Intravenous Q8H PRN Taimi Towe, Acey Lav, MD      ? Or  ? ondansetron (ZOFRAN) 8 mg in sodium chloride 0.9 % 50 mL IVPB  8 mg Intravenous Q8H PRN Kailynn Satterly, Acey Lav, MD      ? senna-docusate (Senokot-S) tablet 1 tablet  1 tablet Oral QHS PRN Ventura Sellers, MD      ? sodium bicarbonate 150 mEq in dextrose 5 % 1,150 mL infusion   Intravenous Continuous Ventura Sellers, MD 100 mL/hr at 04/02/22 1844 New Bag at 04/02/22 1844  ? ? ?REVIEW OF SYSTEMS:   ?Constitutional: Denies fevers, chills or abnormal weight loss ?Eyes: Denies blurriness of vision ?Ears, nose, mouth, throat, and face: Denies mucositis or sore throat ?Respiratory: Denies cough, dyspnea or wheezes ?Cardiovascular: Denies palpitation, chest discomfort or lower extremity swelling ?Gastrointestinal:  Denies nausea, constipation, diarrhea ?GU: Denies dysuria or incontinence ?Skin: Denies abnormal skin rashes ?Neurological: Per HPI ?Musculoskeletal: Denies joint pain, back or neck discomfort. No decrease in ROM ?Behavioral/Psych: Denies anxiety, disturbance in thought content, and mood instability ? ? ?PHYSICAL EXAMINATION: ?Vitals:  ? 04/03/22 1423 04/03/22 1506  ?BP: (!) 103/53 (!) 114/56  ?Pulse: (!) 55 61  ?Resp: 18 16  ?Temp: 97.6 ?F (36.4 ?C) 97.8 ?F (36.6 ?C)  ?SpO2: 93% 90%  ? ? ?KPS: 60. ?General: Alert, cooperative, pleasant, in no acute distress ?Head: Craniotomy scar noted, dry and intact. ?EENT: No conjunctival injection or scleral icterus.  Oral mucosa moist ?Lungs: Resp effort normal ?Cardiac: Regular rate and rhythm ?Abdomen: Soft, non-distended abdomen ?Skin: No rashes cyanosis or petechiae. ?Extremities: No clubbing or edema ? ?NEUROLOGIC EXAM: ?MMental Status: Awake, but drowsy, attentive to examiner only with stimulation. Oriented to self and environment. Language is impaired with regards to fluency, comprehension intact to multi step.  ?Cranial Nerves: Visual acuity is grossly normal. Visual fields are full. Extra-ocular movements intact. No ptosis. Face is symmetric ?Motor: Tone and bulk are normal. Power is noted 4/5 in right arm and leg. Reflexes are symmetric, no pathologic reflexes present.  ?Sensory: Intact to light touch ?  Gait: Hemiparetic ? ? ?LABORATORY DATA:  ?I have reviewed the data as listed ?Lab Results  ?Component Value Date  ? WBC 11.5 (H) 04/03/2022  ? HGB 10.0 (L) 04/03/2022  ? HCT 30.6 (L) 04/03/2022  ? MCV 90.5 04/03/2022  ? PLT 247 04/03/2022  ? ?Recent Labs  ?  04/01/22 ?0450 04/02/22 ?9937 04/03/22 ?0455  ?NA 140 141 141  ?K 3.5 3.7 3.5  ?CL 100 97* 96*  ?CO2 35* 36* 38*  ?GLUCOSE 143* 216* 191*  ?BUN 17 17 25*  ?CREATININE 0.84 0.99 0.91  ?CALCIUM 8.1* 7.7* 7.8*  ?GFRNONAA >60 58* >60  ?PROT 5.0* 5.0* 5.2*  ?ALBUMIN 2.5* 2.4* 2.5*  ?AST 17 21 42*  ?ALT 19 24 51*  ?ALKPHOS 68 65 61  ?BILITOT 0.5 0.6 0.2*  ? ? ?RADIOGRAPHIC STUDIES: ?I have personally reviewed the radiological images as listed and agreed with the findings in the report. ?CT Head Wo Contrast ? ?Result Date: 03/05/2022 ?CLINICAL DATA:  Altered mental status EXAM: CT HEAD WITHOUT CONTRAST TECHNIQUE: Contiguous axial images were obtained from the base of the skull through the vertex without intravenous contrast. RADIATION DOSE REDUCTION: This exam was performed according to the departmental dose-optimization program which includes automated exposure control, adjustment of the mA and/or kV according to patient size and/or use of iterative reconstruction technique.  COMPARISON:  02/14/2022 MRI, correlation is also made with CT head 01/02/2022 FINDINGS: Brain: Likely further interval increase in the size of the mass spanning the bilateral frontal lobes, which measures approxima

## 2022-04-04 ENCOUNTER — Encounter: Payer: Self-pay | Admitting: Internal Medicine

## 2022-04-04 DIAGNOSIS — C8589 Other specified types of non-Hodgkin lymphoma, extranodal and solid organ sites: Secondary | ICD-10-CM | POA: Diagnosis not present

## 2022-04-04 LAB — URINALYSIS, COMPLETE (UACMP) WITH MICROSCOPIC
Bilirubin Urine: NEGATIVE
Glucose, UA: 150 mg/dL — AB
Hgb urine dipstick: NEGATIVE
Ketones, ur: NEGATIVE mg/dL
Nitrite: NEGATIVE
Protein, ur: NEGATIVE mg/dL
Specific Gravity, Urine: 1.01 (ref 1.005–1.030)
WBC, UA: >50 WBC/hpf — ABNORMAL HIGH (ref 0–5)
pH: 9 — ABNORMAL HIGH (ref 5.0–8.0)

## 2022-04-04 LAB — CBC WITH DIFFERENTIAL/PLATELET
Abs Immature Granulocytes: 0.03 10*3/uL (ref 0.00–0.07)
Basophils Absolute: 0 10*3/uL (ref 0.0–0.1)
Basophils Relative: 0 %
Eosinophils Absolute: 0 10*3/uL (ref 0.0–0.5)
Eosinophils Relative: 0 %
HCT: 30 % — ABNORMAL LOW (ref 36.0–46.0)
Hemoglobin: 9.6 g/dL — ABNORMAL LOW (ref 12.0–15.0)
Immature Granulocytes: 0 %
Lymphocytes Relative: 19 %
Lymphs Abs: 1.3 10*3/uL (ref 0.7–4.0)
MCH: 29.2 pg (ref 26.0–34.0)
MCHC: 32 g/dL (ref 30.0–36.0)
MCV: 91.2 fL (ref 80.0–100.0)
Monocytes Absolute: 0.1 10*3/uL (ref 0.1–1.0)
Monocytes Relative: 2 %
Neutro Abs: 5.6 10*3/uL (ref 1.7–7.7)
Neutrophils Relative %: 79 %
Platelets: 247 10*3/uL (ref 150–400)
RBC: 3.29 MIL/uL — ABNORMAL LOW (ref 3.87–5.11)
RDW: 15 % (ref 11.5–15.5)
WBC: 7 10*3/uL (ref 4.0–10.5)
nRBC: 0 % (ref 0.0–0.2)

## 2022-04-04 LAB — COMPREHENSIVE METABOLIC PANEL
ALT: 224 U/L — ABNORMAL HIGH (ref 0–44)
AST: 156 U/L — ABNORMAL HIGH (ref 15–41)
Albumin: 2.4 g/dL — ABNORMAL LOW (ref 3.5–5.0)
Alkaline Phosphatase: 61 U/L (ref 38–126)
Anion gap: 7 (ref 5–15)
BUN: 30 mg/dL — ABNORMAL HIGH (ref 8–23)
CO2: 40 mmol/L — ABNORMAL HIGH (ref 22–32)
Calcium: 7.9 mg/dL — ABNORMAL LOW (ref 8.9–10.3)
Chloride: 94 mmol/L — ABNORMAL LOW (ref 98–111)
Creatinine, Ser: 1.03 mg/dL — ABNORMAL HIGH (ref 0.44–1.00)
GFR, Estimated: 56 mL/min — ABNORMAL LOW (ref >60)
Glucose, Bld: 178 mg/dL — ABNORMAL HIGH (ref 70–99)
Potassium: 3.4 mmol/L — ABNORMAL LOW (ref 3.5–5.1)
Sodium: 141 mmol/L (ref 135–145)
Total Bilirubin: 0.3 mg/dL (ref 0.3–1.2)
Total Protein: 4.9 g/dL — ABNORMAL LOW (ref 6.5–8.1)

## 2022-04-04 LAB — METHOTREXATE: Methotrexate: 0.53

## 2022-04-04 MED ORDER — ZINC OXIDE 12.8 % EX OINT
TOPICAL_OINTMENT | CUTANEOUS | Status: DC | PRN
  Filled 2022-04-04: qty 56.7

## 2022-04-04 MED ORDER — LEUCOVORIN CALCIUM INJECTION 100 MG
15.0000 mg | Freq: Four times a day (QID) | INTRAMUSCULAR | Status: AC
  Administered 2022-04-04 – 2022-04-05 (×4): 16 mg via INTRAVENOUS
  Filled 2022-04-04 (×6): qty 0.8

## 2022-04-04 MED ORDER — HEPARIN SOD (PORK) LOCK FLUSH 100 UNIT/ML IV SOLN
INTRAVENOUS | Status: AC
  Filled 2022-04-04: qty 5

## 2022-04-04 NOTE — Care Management Important Message (Signed)
Important Message ? ?Patient Details IM Letter placed in Patients room. ?Name: Isabel Gibbs ?MRN: [id number] ?Date of Birth: 25-Mar-1946 ? ? ?Medicare Important Message Given:  Yes ? ? ? ? ?Kerin Salen ?04/04/2022, 11:25 AM ?

## 2022-04-04 NOTE — Evaluation (Signed)
Physical Therapy Evaluation ?Patient Details ?Name: Isabel Gibbs ?MRN: [id number] ?DOB: Aug 31, 1946 ?Today's Date: 04/04/2022 ? ?History of Present Illness ? 76 y.o. female presented  for cycle of methotrexate induction+rituximab for primary cns lymphoma. PMH: HTN, prediabetes, CNS lymphoma.  ?Clinical Impression ? Pt admitted with above diagnosis. Attempted supine to sit, pt unable to tolerate movement due to pain at buttocks wound. Mod assist to roll L and R for linen change (bedpad saturated in urine) and for repositioning. Encouraged pt to roll side to side every 2 hours to minimize pressure to her buttocks wound. Family stated they feel they can care for pt at home. She has not been out of bed since admission, and was non ambulatory, requiring assistance for transfers to a WC prior to this admission.  Pt currently with functional limitations due to the deficits listed below (see PT Problem List). Pt will benefit from skilled PT to increase their independence and safety with mobility to allow discharge to the venue listed below.   ?   ?   ? ?Recommendations for follow up therapy are one component of a multi-disciplinary discharge planning process, led by the attending physician.  Recommendations may be updated based on patient status, additional functional criteria and insurance authorization. ? ?Follow Up Recommendations Home health PT ? ?  ?Assistance Recommended at Discharge Frequent or constant Supervision/Assistance  ?Patient can return home with the following ? Two people to help with walking and/or transfers;A lot of help with bathing/dressing/bathroom;Assistance with cooking/housework;Help with stairs or ramp for entrance;Assist for transportation ? ?  ?Equipment Recommendations Other (comment) (may need hoyer lift, depending on progress)  ?Recommendations for Other Services ?    ?  ?Functional Status Assessment Patient has had a recent decline in their functional status and demonstrates the ability to make  significant improvements in function in a reasonable and predictable amount of time.  ? ?  ?Precautions / Restrictions Precautions ?Precaution Comments: wound on buttocks ?Restrictions ?Weight Bearing Restrictions: No  ? ?  ? ?Mobility ? Bed Mobility ?Overal bed mobility: Needs Assistance ?Bed Mobility: Rolling ?Rolling: Mod assist ?  ?  ?  ?  ?General bed mobility comments: pt rolled L and R x 2 each for linen change (bedpad was saturated in urine, pt on purewick), pericare, and repositioning. Attempted supine to sit but pt reported pain at buttocks wound was too intense to continue.Discussed with pt and her 2 sons importance of position changes q 2 hours. Pt stated she's been on her back since admission. ?  ? ?Transfers ?  ?  ?  ?  ?  ?  ?  ?  ?  ?  ?  ? ?Ambulation/Gait ?  ?  ?  ?  ?  ?  ?  ?  ? ?Stairs ?  ?  ?  ?  ?  ? ?Wheelchair Mobility ?  ? ?Modified Rankin (Stroke Patients Only) ?  ? ?  ? ?Balance   ?  ?  ?  ?  ?  ?  ?  ?  ?  ?  ?  ?  ?  ?  ?  ?  ?  ?  ?   ? ? ? ?Pertinent Vitals/Pain Pain Assessment ?Pain Assessment: 0-10 ?Pain Score: 8  ?Pain Location: wound on buttocks ?Pain Descriptors / Indicators: Grimacing, Burning ?Pain Intervention(s): Limited activity within patient's tolerance, Monitored during session, Patient requesting pain meds-RN notified  ? ? ?Home Living Family/patient expects to be discharged to:: Private residence ?Living  Arrangements: Children ?Available Help at Discharge: Family;Available 24 hours/day ?Type of Home: House ?Home Access: Ramped entrance ?  ?  ?  ?Home Layout: One level ?Home Equipment: Conservation officer, nature (2 wheels);Cane - single point;Wheelchair - manual;Shower seat ?Additional Comments: Sleeps in lift recliner  ?  ?Prior Function   ?  ?  ?  ?  ?  ?  ?Mobility Comments: since recent hospitalization in early March, pt has been transferring to [facility] with assitance from family, she's not been walking ?ADLs Comments: sponge bathing with assistance ?  ? ? ?Hand Dominance  ?   ? ?   ?Extremity/Trunk Assessment  ? Upper Extremity Assessment ?Upper Extremity Assessment: Generalized weakness ?  ? ?Lower Extremity Assessment ?Lower Extremity Assessment: Generalized weakness;RLE deficits/detail;LLE deficits/detail ?RLE Deficits / Details: ankle DF -4/5, ?RLE: Unable to fully assess due to pain ?RLE Sensation: WNL ?LLE Deficits / Details: ankle DF -4/5 ?LLE: Unable to fully assess due to pain ?LLE Sensation: WNL ?  ? ?   ?Communication  ? Communication: No difficulties  ?Cognition Arousal/Alertness: Awake/alert ?Behavior During Therapy: [facility] for tasks assessed/performed ?Overall Cognitive Status: Within Functional Limits for tasks assessed ?  ?  ?  ?  ?  ?  ?  ?  ?  ?  ?  ?  ?  ?  ?  ?  ?  ?  ?  ? ?  ?General Comments   ? ?  ?Exercises    ? ?Assessment/Plan  ?  ?PT Assessment Patient needs continued PT services  ?PT Problem List Decreased activity tolerance;Decreased strength;Decreased mobility;Pain ? ?   ?  ?PT Treatment Interventions Therapeutic activities;Therapeutic exercise;Functional mobility training;Patient/family education;Balance training   ? ?PT Goals (Current goals can be found in the Care Plan section)  ?Acute Rehab PT Goals ?Patient Stated Goal: to be able to get out of bed to a chair, for buttocks wound to heal ?PT Goal Formulation: With patient/family ?Time For Goal Achievement: 04/18/22 ?Potential to Achieve Goals: Fair ? ?  ?Frequency Min 3X/week ?  ? ? ?Co-evaluation   ?  ?  ?  ?  ? ? ?  ?AM-PAC PT "6 Clicks" Mobility  ?Outcome Measure Help needed turning from your back to your side while in a flat bed without using bedrails?: A Lot ?Help needed moving from lying on your back to sitting on the side of a flat bed without using bedrails?: Total ?Help needed moving to and from a bed to a chair (including a wheelchair)?: Total ?Help needed standing up from a chair using your arms (e.g., wheelchair or bedside chair)?: Total ?Help needed to walk in hospital room?: Total ?Help needed  climbing 3-5 steps with a railing? : Total ?6 Click Score: 7 ? ?  ?End of Session   ?Activity Tolerance: Patient tolerated treatment well ?Patient left: in bed;with bed alarm set;with family/visitor present;with call bell/phone within reach;with nursing/sitter in room ?Nurse Communication: Mobility status;Need for lift equipment ?PT Visit Diagnosis: Difficulty in walking, not elsewhere classified (R26.2);Other abnormalities of gait and mobility (R26.89);Muscle weakness (generalized) (M62.81) ?  ? ?Time: 1324-4010 ?PT Time Calculation (min) (ACUTE ONLY): 35 min ? ? ?Charges:   PT Evaluation ?$PT Eval Moderate Complexity: 1 Mod ?PT Treatments ?$Therapeutic Activity: 8-22 mins ?  ?   ? ? ?Blondell Reveal Kistler PT 04/04/2022  ?Acute Rehabilitation Services ?Pager [phone] ?Office [phone] ? ? ?

## 2022-04-04 NOTE — Progress Notes (Signed)
[place] ?Neuro-Oncology Note ? ?Patient Care Team: ?Karleen Hampshire., MD as PCP - General (Internal Medicine) ? ?CHIEF COMPLAINTS/PURPOSE OF CONSULTATION:  ?Primary CNS Lymphoma ? ?Oncology History  ?CNS lymphoma ([place])  ?01/02/2022 Cancer Staging  ? Staging form: Brain and Spinal Cord, AJCC Version 9 ?- Clinical stage from 01/02/2022: WHO G4 - Signed by Ventura Sellers, MD on 01/27/2022 ?Histopathologic type: Malignant lymphoma, large B-cell, diffuse, NOS ?Stage prefix: Initial diagnosis ?Histologic grading system: 4 grade system ? ?  ?01/27/2022 Initial Diagnosis  ? CNS lymphoma ([place]) ?  ?03/11/2022 -  Chemotherapy  ? Patient is on Treatment Plan : IP NON-HODGKINS LYMPHOMA High Dose Methotrexate + Leucovorin Rescue  ?   ?03/13/2022 -  Chemotherapy  ? Patient is on Treatment Plan : NON-HODGKINS LYMPHOMA Rituximab q21d  ?   ? ? ?INTERVAL HISTORY:  ?Isabel Gibbs is resting comfortably today.  No issues with fluids.  Did well with rituximab.  Has been out of bed today, working with PT. ? ?MEDICAL HISTORY:  ?Past Medical History:  ?Diagnosis Date  ? Hypertension   ? Prediabetes   ? 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 ? ?SURGICAL HISTORY: ?Past Surgical History:  ?Procedure Laterality Date  ? APPLICATION OF CRANIAL NAVIGATION Left 01/02/2022  ? Procedure: APPLICATION OF CRANIAL NAVIGATION;  Surgeon: Judith Part, MD;  Location: [facility];  Service: Neurosurgery;  Laterality: Left;  ? CEREBRAL MICROVASCULAR DECOMPRESSION Right 08/02/2018  ? [place]  ? FRAMELESS  BIOPSY WITH BRAINLAB Left 01/02/2022  ? Procedure: LEFT BRAIN  BIOPSY WITH Lucky Rathke;  Surgeon: Judith Part, MD;  Location: [facility];  Service: Neurosurgery;  Laterality: Left;  ? IR IMAGING GUIDED PORT INSERTION  03/04/2022  ? RADIOLOGY WITH ANESTHESIA N/A 01/01/2022  ? Procedure: MRI of Brain with and without contrast;  Surgeon: Radiologist, Medication, MD;  Location: [facility];  Service: Radiology;  Laterality: N/A;  ? ? ?SOCIAL HISTORY: ?Social  History  ? ?Socioeconomic History  ? Marital status: Widowed  ?  Spouse name: Not on file  ? Number of children: Not on file  ? Years of education: Not on file  ? Highest education level: Not on file  ?Occupational History  ? Not on file  ?Tobacco Use  ? Smoking status: Former  ?  Types: Cigarettes  ?  Quit date: 2000  ?  Years since quitting: 23.2  ? Smokeless tobacco: Never  ?Vaping Use  ? Vaping Use: Never used  ?Substance and Sexual Activity  ? Alcohol use: Never  ? Drug use: Never  ? Sexual activity: Not on file  ?Other Topics Concern  ? Not on file  ?Social History Narrative  ? Not on file  ? ?Social Determinants of Health  ? ?Financial Resource Strain: Not on file  ?Food Insecurity: Not on file  ?Transportation Needs: Not on file  ?Physical Activity: Not on file  ?Stress: Not on file  ?Social Connections: Not on file  ?Intimate Partner Violence: Not on file  ? ? ?FAMILY HISTORY: ?Family History  ?Problem Relation Age of Onset  ? Heart failure Brother   ? Diabetes Brother   ? Leukemia Brother   ? ? ?ALLERGIES:  is allergic to codeine, lisinopril, and hydrocodone-acetaminophen. ? ?MEDICATIONS:  ?Current Facility-Administered Medications  ?Medication Dose Route Frequency Provider Last Rate Last Admin  ? acetaminophen (TYLENOL) tablet 650 mg  650 mg Oral Q4H PRN Ventura Sellers, MD   650 mg at 04/04/22 1549  ?  alum & mag hydroxide-simeth (MAALOX/MYLANTA) 200-200-20 MG/5ML suspension 60 mL  60 mL Oral Q4H PRN Danielle Mink, Acey Lav, MD      ? cefTRIAXone (ROCEPHIN) 1 g in sodium chloride 0.9 % 100 mL IVPB  1 g Intravenous Q24H Ventura Sellers, MD 200 mL/hr at 04/04/22 1501 1 g at 04/04/22 1501  ? Chlorhexidine Gluconate Cloth 2 % PADS 6 each  6 each Topical Daily Ventura Sellers, MD   6 each at 04/04/22 1255  ? gabapentin (NEURONTIN) capsule 900 mg  900 mg Oral BID Ventura Sellers, MD   900 mg at 04/04/22 1305  ? Gerhardt's butt cream   Topical BID Ventura Sellers, MD   Given at 04/04/22 1255  ?  guaiFENesin-dextromethorphan (ROBITUSSIN DM) 100-10 MG/5ML syrup 10 mL  10 mL Oral Q4H PRN Ventura Sellers, MD      ? heparin lock flush 100 UNIT/ML injection           ? irbesartan (AVAPRO) tablet 150 mg  150 mg Oral QHS Ventura Sellers, MD   150 mg at 04/03/22 2102  ? leucovorin injection 16 mg  16 mg Intravenous Q6H Ventura Sellers, MD   16 mg at 04/04/22 1549  ? ondansetron (ZOFRAN) tablet 4-8 mg  4-8 mg Oral Q8H PRN Melinda Gwinner, Acey Lav, MD      ? Or  ? ondansetron (ZOFRAN-ODT) disintegrating tablet 4-8 mg  4-8 mg Oral Q8H PRN Cordarryl Monrreal, Acey Lav, MD      ? Or  ? ondansetron (ZOFRAN) injection 4 mg  4 mg Intravenous Q8H PRN Tashonna Descoteaux, Acey Lav, MD      ? Or  ? ondansetron (ZOFRAN) 8 mg in sodium chloride 0.9 % 50 mL IVPB  8 mg Intravenous Q8H PRN Darly Massi, Acey Lav, MD      ? senna-docusate (Senokot-S) tablet 1 tablet  1 tablet Oral QHS PRN Ventura Sellers, MD      ? sodium bicarbonate 150 mEq in dextrose 5 % 1,150 mL infusion   Intravenous Continuous Ventura Sellers, MD 100 mL/hr at 04/04/22 1303 New Bag at 04/04/22 1303  ? Zinc Oxide (TRIPLE PASTE) 12.8 % ointment   Topical PRN Jamier Urbas, Acey Lav, MD      ? ? ?REVIEW OF SYSTEMS:   ?Constitutional: Denies fevers, chills or abnormal weight loss ?Eyes: Denies blurriness of vision ?Ears, nose, mouth, throat, and face: Denies mucositis or sore throat ?Respiratory: Denies cough, dyspnea or wheezes ?Cardiovascular: Denies palpitation, chest discomfort or lower extremity swelling ?Gastrointestinal:  Denies nausea, constipation, diarrhea ?GU: Denies dysuria or incontinence ?Skin: Denies abnormal skin rashes ?Neurological: Per HPI ?Musculoskeletal: Denies joint pain, back or neck discomfort. No decrease in ROM ?Behavioral/Psych: Denies anxiety, disturbance in thought content, and mood instability ? ? ?PHYSICAL EXAMINATION: ?Vitals:  ? 04/04/22 0508 04/04/22 1348  ?BP: 118/63 121/68  ?Pulse: (!) 109 83  ?Resp: 16 20  ?Temp: (!) 97.4 ?F (36.3 ?C) (!) 97.4 ?F (36.3 ?C)   ?SpO2: 97% 97%  ? ? ?KPS: 60. ?General: Alert, cooperative, pleasant, in no acute distress ?Head: Craniotomy scar noted, dry and intact. ?EENT: No conjunctival injection or scleral icterus. Oral mucosa moist ?Lungs: Resp effort normal ?Cardiac: Regular rate and rhythm ?Abdomen: Soft, non-distended abdomen ?Skin: No rashes cyanosis or petechiae. ?Extremities: No clubbing or edema ? ?NEUROLOGIC EXAM: ?MMental Status: Awake, but drowsy, attentive to examiner only with stimulation. Oriented to self and environment. Language is impaired with regards to fluency, comprehension intact to multi step.  ?  Cranial Nerves: Visual acuity is grossly normal. Visual fields are full. Extra-ocular movements intact. No ptosis. Face is symmetric ?Motor: Tone and bulk are normal. Power is noted 4/5 in right arm and leg. Reflexes are symmetric, no pathologic reflexes present.  ?Sensory: Intact to light touch ?Gait: Hemiparetic ? ? ?LABORATORY DATA:  ?I have reviewed the data as listed ?Lab Results  ?Component Value Date  ? WBC 7.0 04/04/2022  ? HGB 9.6 (L) 04/04/2022  ? HCT 30.0 (L) 04/04/2022  ? MCV 91.2 04/04/2022  ? PLT 247 04/04/2022  ? ?Recent Labs  ?  04/02/22 ?7209 04/03/22 ?0455 04/04/22 ?0510  ?NA 141 141 141  ?K 3.7 3.5 3.4*  ?CL 97* 96* 94*  ?CO2 36* 38* 40*  ?GLUCOSE 216* 191* 178*  ?BUN 17 25* 30*  ?CREATININE 0.99 0.91 1.03*  ?CALCIUM 7.7* 7.8* 7.9*  ?GFRNONAA 58* >60 56*  ?PROT 5.0* 5.2* 4.9*  ?ALBUMIN 2.4* 2.5* 2.4*  ?AST 21 42* 156*  ?ALT 24 51* 224*  ?ALKPHOS 47 09 62  ?BILITOT 0.6 0.2* 0.3  ? ? ?RADIOGRAPHIC STUDIES: ?I have personally reviewed the radiological images as listed and agreed with the findings in the report. ?CT Head Wo Contrast ? ?Result Date: 03/05/2022 ?CLINICAL DATA:  Altered mental status EXAM: CT HEAD WITHOUT CONTRAST TECHNIQUE: Contiguous axial images were obtained from the base of the skull through the vertex without intravenous contrast. RADIATION DOSE REDUCTION: This exam was performed according to  the departmental dose-optimization program which includes automated exposure control, adjustment of the mA and/or kV according to patient size and/or use of iterative reconstruction technique. COMPARISON:  02/14/2022

## 2022-04-05 ENCOUNTER — Encounter: Payer: Self-pay | Admitting: Internal Medicine

## 2022-04-05 DIAGNOSIS — C8589 Other specified types of non-Hodgkin lymphoma, extranodal and solid organ sites: Secondary | ICD-10-CM | POA: Diagnosis not present

## 2022-04-05 LAB — COMPREHENSIVE METABOLIC PANEL
ALT: 461 U/L — ABNORMAL HIGH (ref 0–44)
AST: 216 U/L — ABNORMAL HIGH (ref 15–41)
Albumin: 2.4 g/dL — ABNORMAL LOW (ref 3.5–5.0)
Alkaline Phosphatase: 65 U/L (ref 38–126)
Anion gap: 5 (ref 5–15)
BUN: 34 mg/dL — ABNORMAL HIGH (ref 8–23)
CO2: 44 mmol/L — ABNORMAL HIGH (ref 22–32)
Calcium: 7.8 mg/dL — ABNORMAL LOW (ref 8.9–10.3)
Chloride: 93 mmol/L — ABNORMAL LOW (ref 98–111)
Creatinine, Ser: 1.19 mg/dL — ABNORMAL HIGH (ref 0.44–1.00)
GFR, Estimated: 47 mL/min — ABNORMAL LOW (ref >60)
Glucose, Bld: 123 mg/dL — ABNORMAL HIGH (ref 70–99)
Potassium: 3.1 mmol/L — ABNORMAL LOW (ref 3.5–5.1)
Sodium: 142 mmol/L (ref 135–145)
Total Bilirubin: 0.4 mg/dL (ref 0.3–1.2)
Total Protein: 4.9 g/dL — ABNORMAL LOW (ref 6.5–8.1)

## 2022-04-05 LAB — CBC WITH DIFFERENTIAL/PLATELET
Abs Immature Granulocytes: 0.03 10*3/uL (ref 0.00–0.07)
Basophils Absolute: 0 10*3/uL (ref 0.0–0.1)
Basophils Relative: 0 %
Eosinophils Absolute: 0.1 10*3/uL (ref 0.0–0.5)
Eosinophils Relative: 1 %
HCT: 32.6 % — ABNORMAL LOW (ref 36.0–46.0)
Hemoglobin: 10.4 g/dL — ABNORMAL LOW (ref 12.0–15.0)
Immature Granulocytes: 1 %
Lymphocytes Relative: 20 %
Lymphs Abs: 1.3 10*3/uL (ref 0.7–4.0)
MCH: 29.2 pg (ref 26.0–34.0)
MCHC: 31.9 g/dL (ref 30.0–36.0)
MCV: 91.6 fL (ref 80.0–100.0)
Monocytes Absolute: 0 10*3/uL — ABNORMAL LOW (ref 0.1–1.0)
Monocytes Relative: 1 %
Neutro Abs: 5.2 10*3/uL (ref 1.7–7.7)
Neutrophils Relative %: 77 %
Platelets: 238 10*3/uL (ref 150–400)
RBC: 3.56 MIL/uL — ABNORMAL LOW (ref 3.87–5.11)
RDW: 15.4 % (ref 11.5–15.5)
WBC: 6.6 10*3/uL (ref 4.0–10.5)
nRBC: 0 % (ref 0.0–0.2)

## 2022-04-05 MED ORDER — LEUCOVORIN CALCIUM INJECTION 100 MG
15.0000 mg | Freq: Four times a day (QID) | INTRAMUSCULAR | Status: AC
  Administered 2022-04-05 – 2022-04-06 (×4): 16 mg via INTRAVENOUS
  Filled 2022-04-05 (×8): qty 0.8

## 2022-04-05 MED ORDER — SODIUM CHLORIDE 0.9% FLUSH: 10.0000 mL | INTRAVENOUS | Status: DC | PRN

## 2022-04-05 MED ORDER — SODIUM CHLORIDE 0.9% FLUSH
10.0000 mL | Freq: Two times a day (BID) | INTRAVENOUS | Status: DC
  Administered 2022-04-05 – 2022-04-06 (×3): 10 mL

## 2022-04-05 NOTE — Progress Notes (Signed)
[facility] ?Neuro-Oncology Note ? ?Patient Care Team: ?Karleen Hampshire., MD as PCP - General (Internal Medicine) ? ?CHIEF COMPLAINTS/PURPOSE OF CONSULTATION:  ?Primary CNS Lymphoma ? ?Oncology History  ?CNS lymphoma ([place])  ?01/02/2022 Cancer Staging  ? Staging form: Brain and Spinal Cord, AJCC Version 9 ?- Clinical stage from 01/02/2022: WHO G4 - Signed by Ventura Sellers, MD on 01/27/2022 ?Histopathologic type: Malignant lymphoma, large B-cell, diffuse, NOS ?Stage prefix: Initial diagnosis ?Histologic grading system: 4 grade system ? ?  ?01/27/2022 Initial Diagnosis  ? CNS lymphoma ([place]) ?  ?03/11/2022 -  Chemotherapy  ? Patient is on Treatment Plan : IP NON-HODGKINS LYMPHOMA High Dose Methotrexate + Leucovorin Rescue  ?   ?03/13/2022 -  Chemotherapy  ? Patient is on Treatment Plan : NON-HODGKINS LYMPHOMA Rituximab q21d  ?   ? ? ?INTERVAL HISTORY:  ?Isabel Gibbs is resting comfortably today.  No issues with fluids.  Did well with rituximab.  Has been out of bed today, working with PT. ? ?MEDICAL HISTORY:  ?Past Medical History:  ?Diagnosis Date  ? Hypertension   ? Prediabetes   ? 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 ? ?SURGICAL HISTORY: ?Past Surgical History:  ?Procedure Laterality Date  ? APPLICATION OF CRANIAL NAVIGATION Left 01/02/2022  ? Procedure: APPLICATION OF CRANIAL NAVIGATION;  Surgeon: Judith Part, MD;  Location: [facility];  Service: Neurosurgery;  Laterality: Left;  ? CEREBRAL MICROVASCULAR DECOMPRESSION Right 08/02/2018  ? [place]  ? FRAMELESS  BIOPSY WITH BRAINLAB Left 01/02/2022  ? Procedure: LEFT BRAIN  BIOPSY WITH Lucky Rathke;  Surgeon: Judith Part, MD;  Location: [facility];  Service: Neurosurgery;  Laterality: Left;  ? IR IMAGING GUIDED PORT INSERTION  03/04/2022  ? RADIOLOGY WITH ANESTHESIA N/A 01/01/2022  ? Procedure: MRI of Brain with and without contrast;  Surgeon: Radiologist, Medication, MD;  Location: [facility];  Service: Radiology;  Laterality: N/A;  ? ? ?SOCIAL HISTORY: ?Social  History  ? ?Socioeconomic History  ? Marital status: Widowed  ?  Spouse name: Not on file  ? Number of children: Not on file  ? Years of education: Not on file  ? Highest education level: Not on file  ?Occupational History  ? Not on file  ?Tobacco Use  ? Smoking status: Former  ?  Types: Cigarettes  ?  Quit date: 2000  ?  Years since quitting: 23.2  ? Smokeless tobacco: Never  ?Vaping Use  ? Vaping Use: Never used  ?Substance and Sexual Activity  ? Alcohol use: Never  ? Drug use: Never  ? Sexual activity: Not on file  ?Other Topics Concern  ? Not on file  ?Social History Narrative  ? Not on file  ? ?Social Determinants of Health  ? ?Financial Resource Strain: Not on file  ?Food Insecurity: Not on file  ?Transportation Needs: Not on file  ?Physical Activity: Not on file  ?Stress: Not on file  ?Social Connections: Not on file  ?Intimate Partner Violence: Not on file  ? ? ?FAMILY HISTORY: ?Family History  ?Problem Relation Age of Onset  ? Heart failure Brother   ? Diabetes Brother   ? Leukemia Brother   ? ? ?ALLERGIES:  is allergic to codeine, lisinopril, and hydrocodone-acetaminophen. ? ?MEDICATIONS:  ?Current Facility-Administered Medications  ?Medication Dose Route Frequency Provider Last Rate Last Admin  ? acetaminophen (TYLENOL) tablet 650 mg  650 mg Oral Q4H PRN Ventura Sellers, MD   650 mg at 04/05/22 6759  ?  alum & mag hydroxide-simeth (MAALOX/MYLANTA) 200-200-20 MG/5ML suspension 60 mL  60 mL Oral Q4H PRN Quintessa Simmerman, Acey Lav, MD      ? Chlorhexidine Gluconate Cloth 2 % PADS 6 each  6 each Topical Daily Ventura Sellers, MD   6 each at 04/04/22 1255  ? gabapentin (NEURONTIN) capsule 900 mg  900 mg Oral BID Ventura Sellers, MD   900 mg at 04/05/22 1518  ? Gerhardt's butt cream   Topical BID Ventura Sellers, MD   1 application. at 04/05/22 1046  ? guaiFENesin-dextromethorphan (ROBITUSSIN DM) 100-10 MG/5ML syrup 10 mL  10 mL Oral Q4H PRN Ventura Sellers, MD      ? irbesartan (AVAPRO) tablet 150 mg  150 mg  Oral QHS Ventura Sellers, MD   150 mg at 04/04/22 2114  ? leucovorin injection 16 mg  16 mg Intravenous Q6H Ventura Sellers, MD   16 mg at 04/05/22 1520  ? ondansetron (ZOFRAN) tablet 4-8 mg  4-8 mg Oral Q8H PRN Adonica Fukushima, Acey Lav, MD      ? Or  ? ondansetron (ZOFRAN-ODT) disintegrating tablet 4-8 mg  4-8 mg Oral Q8H PRN Zameer Borman, Acey Lav, MD      ? Or  ? ondansetron (ZOFRAN) injection 4 mg  4 mg Intravenous Q8H PRN Kortney Schoenfelder, Acey Lav, MD      ? Or  ? ondansetron (ZOFRAN) 8 mg in sodium chloride 0.9 % 50 mL IVPB  8 mg Intravenous Q8H PRN Devaun Hernandez, Acey Lav, MD      ? senna-docusate (Senokot-S) tablet 1 tablet  1 tablet Oral QHS PRN Ventura Sellers, MD      ? sodium bicarbonate 150 mEq in dextrose 5 % 1,150 mL infusion   Intravenous Continuous Ventura Sellers, MD 100 mL/hr at 04/05/22 1524 New Bag at 04/05/22 1524  ? sodium chloride flush (NS) 0.9 % injection 10-40 mL  10-40 mL Intracatheter Q12H Ventura Sellers, MD   10 mL at 04/05/22 1044  ? sodium chloride flush (NS) 0.9 % injection 10-40 mL  10-40 mL Intracatheter PRN Kyannah Climer, Acey Lav, MD      ? Zinc Oxide (TRIPLE PASTE) 12.8 % ointment   Topical PRN Monnie Anspach, Acey Lav, MD      ? ? ?REVIEW OF SYSTEMS:   ?Constitutional: Denies fevers, chills or abnormal weight loss ?Eyes: Denies blurriness of vision ?Ears, nose, mouth, throat, and face: Denies mucositis or sore throat ?Respiratory: Denies cough, dyspnea or wheezes ?Cardiovascular: Denies palpitation, chest discomfort or lower extremity swelling ?Gastrointestinal:  Denies nausea, constipation, diarrhea ?GU: Denies dysuria or incontinence ?Skin: Denies abnormal skin rashes ?Neurological: Per HPI ?Musculoskeletal: Denies joint pain, back or neck discomfort. No decrease in ROM ?Behavioral/Psych: Denies anxiety, disturbance in thought content, and mood instability ? ? ?PHYSICAL EXAMINATION: ?Vitals:  ? 04/04/22 2054 04/05/22 0532  ?BP: 116/60 (!) 120/54  ?Pulse: 93 70  ?Resp: 16 18  ?Temp: 98 ?F (36.7 ?C) 98.4  ?F (36.9 ?C)  ?SpO2: 93% 92%  ? ? ?KPS: 60. ?General: Alert, cooperative, pleasant, in no acute distress ?Head: Craniotomy scar noted, dry and intact. ?EENT: No conjunctival injection or scleral icterus. Oral mucosa moist ?Lungs: Resp effort normal ?Cardiac: Regular rate and rhythm ?Abdomen: Soft, non-distended abdomen ?Skin: No rashes cyanosis or petechiae. ?Extremities: No clubbing or edema ? ?NEUROLOGIC EXAM: ?MMental Status: Awake, but drowsy, attentive to examiner only with stimulation. Oriented to self and environment. Language is impaired with regards to fluency, comprehension intact to multi step.  ?  Cranial Nerves: Visual acuity is grossly normal. Visual fields are full. Extra-ocular movements intact. No ptosis. Face is symmetric ?Motor: Tone and bulk are normal. Power is noted 4/5 in right arm and leg. Reflexes are symmetric, no pathologic reflexes present.  ?Sensory: Intact to light touch ?Gait: Hemiparetic ? ? ?LABORATORY DATA:  ?I have reviewed the data as listed ?Lab Results  ?Component Value Date  ? WBC 6.6 04/05/2022  ? HGB 10.4 (L) 04/05/2022  ? HCT 32.6 (L) 04/05/2022  ? MCV 91.6 04/05/2022  ? PLT 238 04/05/2022  ? ?Recent Labs  ?  04/03/22 ?0455 04/04/22 ?0510 04/05/22 ?1610  ?NA 141 141 142  ?K 3.5 3.4* 3.1*  ?CL 96* 94* 93*  ?CO2 38* 40* 44*  ?GLUCOSE 191* 178* 123*  ?BUN 25* 30* 34*  ?CREATININE 0.91 1.03* 1.19*  ?CALCIUM 7.8* 7.9* 7.8*  ?GFRNONAA >60 56* 47*  ?PROT 5.2* 4.9* 4.9*  ?ALBUMIN 2.5* 2.4* 2.4*  ?AST 42* 156* 216*  ?ALT 51* 224* 461*  ?ALKPHOS 96 04 54  ?BILITOT 0.2* 0.3 0.4  ? ? ?RADIOGRAPHIC STUDIES: ?I have personally reviewed the radiological images as listed and agreed with the findings in the report. ?No results found. ? ?ASSESSMENT & PLAN:  ?Primary CNS Lymphoma ? ?1)Primary CNS Lymphoma ? ?04/05/22: ?-MTX level 0.3 ?-Still pending urine pH ?-Fluids at 100/hr ?-Will discontinue IV ceftriaxone s/p 5 days given urine culture ?-DC tmrw if MTX <0.10 ? ?Plan for 04/04/22: ?-MTX level  0.5 ?-Mainain fluids at 100/hr ?-PT eval, OOB ?-Likely DC 4/8 or 4/9 ? ?Plan for 04/03/22: ?-Plan for rituxan today ?-MTX level 2.5 ?-Will keep IVF at 100/hr ?-Con't ceftriaxone pending urine cultures ? ?Plan

## 2022-04-06 ENCOUNTER — Encounter: Payer: Self-pay | Admitting: Internal Medicine

## 2022-04-06 DIAGNOSIS — E876 Hypokalemia: Principal | ICD-10-CM

## 2022-04-06 DIAGNOSIS — C8589 Other specified types of non-Hodgkin lymphoma, extranodal and solid organ sites: Secondary | ICD-10-CM | POA: Diagnosis not present

## 2022-04-06 LAB — CBC WITH DIFFERENTIAL/PLATELET
Abs Immature Granulocytes: 0.03 10*3/uL (ref 0.00–0.07)
Basophils Absolute: 0 10*3/uL (ref 0.0–0.1)
Basophils Relative: 0 %
Eosinophils Absolute: 0.1 10*3/uL (ref 0.0–0.5)
Eosinophils Relative: 2 %
HCT: 28.7 % — ABNORMAL LOW (ref 36.0–46.0)
Hemoglobin: 8.8 g/dL — ABNORMAL LOW (ref 12.0–15.0)
Immature Granulocytes: 1 %
Lymphocytes Relative: 25 %
Lymphs Abs: 1.4 10*3/uL (ref 0.7–4.0)
MCH: 28.4 pg (ref 26.0–34.0)
MCHC: 30.7 g/dL (ref 30.0–36.0)
MCV: 92.6 fL (ref 80.0–100.0)
Monocytes Absolute: 0 10*3/uL — ABNORMAL LOW (ref 0.1–1.0)
Monocytes Relative: 1 %
Neutro Abs: 3.9 10*3/uL (ref 1.7–7.7)
Neutrophils Relative %: 71 %
Platelets: 221 10*3/uL (ref 150–400)
RBC: 3.1 MIL/uL — ABNORMAL LOW (ref 3.87–5.11)
RDW: 15.5 % (ref 11.5–15.5)
WBC: 5.4 10*3/uL (ref 4.0–10.5)
nRBC: 0 % (ref 0.0–0.2)

## 2022-04-06 LAB — COMPREHENSIVE METABOLIC PANEL
ALT: 281 U/L — ABNORMAL HIGH (ref 0–44)
AST: 75 U/L — ABNORMAL HIGH (ref 15–41)
Albumin: 2.3 g/dL — ABNORMAL LOW (ref 3.5–5.0)
Alkaline Phosphatase: 59 U/L (ref 38–126)
BUN: 33 mg/dL — ABNORMAL HIGH (ref 8–23)
CO2: >45 mmol/L — ABNORMAL HIGH (ref 22–32)
Calcium: 7.9 mg/dL — ABNORMAL LOW (ref 8.9–10.3)
Chloride: 91 mmol/L — ABNORMAL LOW (ref 98–111)
Creatinine, Ser: 1.44 mg/dL — ABNORMAL HIGH (ref 0.44–1.00)
GFR, Estimated: 37 mL/min — ABNORMAL LOW (ref >60)
Glucose, Bld: 155 mg/dL — ABNORMAL HIGH (ref 70–99)
Potassium: 2.9 mmol/L — ABNORMAL LOW (ref 3.5–5.1)
Sodium: 142 mmol/L (ref 135–145)
Total Bilirubin: 0.5 mg/dL (ref 0.3–1.2)
Total Protein: 4.5 g/dL — ABNORMAL LOW (ref 6.5–8.1)

## 2022-04-06 LAB — METHOTREXATE
Methotrexate: 0.3
Methotrexate: 0.13

## 2022-04-06 MED ORDER — POTASSIUM CHLORIDE 10 MEQ/100ML IV SOLN
10.0000 meq | INTRAVENOUS | Status: AC
  Administered 2022-04-06 (×6): 10 meq via INTRAVENOUS
  Filled 2022-04-06 (×6): qty 100

## 2022-04-06 MED ORDER — LEUCOVORIN CALCIUM INJECTION 100 MG
15.0000 mg | Freq: Four times a day (QID) | INTRAMUSCULAR | Status: AC
  Administered 2022-04-06 – 2022-04-07 (×4): 16 mg via INTRAVENOUS
  Filled 2022-04-06 (×7): qty 0.8

## 2022-04-06 NOTE — Progress Notes (Addendum)
Pharmacy Note regarding weekend Methotrexate levels: ? ?Levels pending in Epic but were received via fax to [place] from [facility] and uploaded in this note. ? ?Methotrexate level on 04/05/2022 0.3 MCMOL/L ?Methotrexate level on 04/06/2022 0.17 MCMOL/L ? ? ? ?Royetta Asal, PharmD, BCPS ?Clinical Pharmacist ?[place] ?Please utilize Amion for appropriate phone number to reach the unit pharmacist ([place]) ?04/06/2022 10:32 AM ? ? ?

## 2022-04-06 NOTE — Progress Notes (Signed)
[facility] ?Neuro-Oncology Note ? ?Patient Care Team: ?Karleen Hampshire., MD as PCP - General (Internal Medicine) ? ?CHIEF COMPLAINTS/PURPOSE OF CONSULTATION:  ?Primary CNS Lymphoma ? ?Oncology History  ?CNS lymphoma ([place])  ?01/02/2022 Cancer Staging  ? Staging form: Brain and Spinal Cord, AJCC Version 9 ?- Clinical stage from 01/02/2022: WHO G4 - Signed by Ventura Sellers, MD on 01/27/2022 ?Histopathologic type: Malignant lymphoma, large B-cell, diffuse, NOS ?Stage prefix: Initial diagnosis ?Histologic grading system: 4 grade system ? ?  ?01/27/2022 Initial Diagnosis  ? CNS lymphoma ([place]) ?  ?03/11/2022 -  Chemotherapy  ? Patient is on Treatment Plan : IP NON-HODGKINS LYMPHOMA High Dose Methotrexate + Leucovorin Rescue  ?   ?03/13/2022 -  Chemotherapy  ? Patient is on Treatment Plan : NON-HODGKINS LYMPHOMA Rituximab q21d  ?   ? ? ?INTERVAL HISTORY:  ?Isabel Gibbs is resting comfortably today.  Potassium is hanging currently.  No other new or progressive changes. ? ?MEDICAL HISTORY:  ?Past Medical History:  ?Diagnosis Date  ? Hypertension   ? Prediabetes   ? 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 ? ?SURGICAL HISTORY: ?Past Surgical History:  ?Procedure Laterality Date  ? APPLICATION OF CRANIAL NAVIGATION Left 01/02/2022  ? Procedure: APPLICATION OF CRANIAL NAVIGATION;  Surgeon: Judith Part, MD;  Location: [facility];  Service: Neurosurgery;  Laterality: Left;  ? CEREBRAL MICROVASCULAR DECOMPRESSION Right 08/02/2018  ? Advance  ? FRAMELESS  BIOPSY WITH BRAINLAB Left 01/02/2022  ? Procedure: LEFT BRAIN  BIOPSY WITH Lucky Rathke;  Surgeon: Judith Part, MD;  Location: [facility];  Service: Neurosurgery;  Laterality: Left;  ? IR IMAGING GUIDED PORT INSERTION  03/04/2022  ? RADIOLOGY WITH ANESTHESIA N/A 01/01/2022  ? Procedure: MRI of Brain with and without contrast;  Surgeon: Radiologist, Medication, MD;  Location: [facility];  Service: Radiology;  Laterality: N/A;  ? ? ?SOCIAL HISTORY: ?Social History  ? ?Socioeconomic  History  ? Marital status: Widowed  ?  Spouse name: Not on file  ? Number of children: Not on file  ? Years of education: Not on file  ? Highest education level: Not on file  ?Occupational History  ? Not on file  ?Tobacco Use  ? Smoking status: Former  ?  Types: Cigarettes  ?  Quit date: 2000  ?  Years since quitting: 23.2  ? Smokeless tobacco: Never  ?Vaping Use  ? Vaping Use: Never used  ?Substance and Sexual Activity  ? Alcohol use: Never  ? Drug use: Never  ? Sexual activity: Not on file  ?Other Topics Concern  ? Not on file  ?Social History Narrative  ? Not on file  ? ?Social Determinants of Health  ? ?Financial Resource Strain: Not on file  ?Food Insecurity: Not on file  ?Transportation Needs: Not on file  ?Physical Activity: Not on file  ?Stress: Not on file  ?Social Connections: Not on file  ?Intimate Partner Violence: Not on file  ? ? ?FAMILY HISTORY: ?Family History  ?Problem Relation Age of Onset  ? Heart failure Brother   ? Diabetes Brother   ? Leukemia Brother   ? ? ?ALLERGIES:  is allergic to codeine, lisinopril, and hydrocodone-acetaminophen. ? ?MEDICATIONS:  ?Current Facility-Administered Medications  ?Medication Dose Route Frequency Provider Last Rate Last Admin  ? acetaminophen (TYLENOL) tablet 650 mg  650 mg Oral Q4H PRN Ventura Sellers, MD   650 mg at 04/05/22 1497  ? alum & mag hydroxide-simeth (MAALOX/MYLANTA) 200-200-20 MG/5ML suspension  60 mL  60 mL Oral Q4H PRN Carolyne Whitsel, Acey Lav, MD      ? Chlorhexidine Gluconate Cloth 2 % PADS 6 each  6 each Topical Daily Ventura Sellers, MD   6 each at 04/06/22 1200  ? gabapentin (NEURONTIN) capsule 900 mg  900 mg Oral BID Ventura Sellers, MD   900 mg at 04/06/22 1555  ? Gerhardt's butt cream   Topical BID Ventura Sellers, MD   Given at 04/06/22 1400  ? guaiFENesin-dextromethorphan (ROBITUSSIN DM) 100-10 MG/5ML syrup 10 mL  10 mL Oral Q4H PRN Ventura Sellers, MD      ? irbesartan (AVAPRO) tablet 150 mg  150 mg Oral QHS Ventura Sellers, MD   150  mg at 04/05/22 2138  ? leucovorin injection 16 mg  16 mg Intravenous Q6H Sailor Haughn, Acey Lav, MD   16 mg at 04/06/22 1700  ? ondansetron (ZOFRAN) tablet 4-8 mg  4-8 mg Oral Q8H PRN Ventura Sellers, MD      ? Or  ? ondansetron (ZOFRAN-ODT) disintegrating tablet 4-8 mg  4-8 mg Oral Q8H PRN Fallynn Gravett, Acey Lav, MD      ? Or  ? ondansetron (ZOFRAN) injection 4 mg  4 mg Intravenous Q8H PRN Eiliyah Reh, Acey Lav, MD      ? Or  ? ondansetron (ZOFRAN) 8 mg in sodium chloride 0.9 % 50 mL IVPB  8 mg Intravenous Q8H PRN Donicia Druck, Acey Lav, MD      ? potassium chloride 10 mEq in 100 mL IVPB  10 mEq Intravenous Q1 Hr x 6 Emmaclaire Switala, Acey Lav, MD 100 mL/hr at 04/06/22 1933 10 mEq at 04/06/22 1933  ? senna-docusate (Senokot-S) tablet 1 tablet  1 tablet Oral QHS PRN Ventura Sellers, MD      ? sodium bicarbonate 150 mEq in dextrose 5 % 1,150 mL infusion   Intravenous Continuous Ventura Sellers, MD 100 mL/hr at 04/05/22 1524 New Bag at 04/05/22 1524  ? sodium chloride flush (NS) 0.9 % injection 10-40 mL  10-40 mL Intracatheter Q12H Tawney Vanorman, Acey Lav, MD   10 mL at 04/06/22 1110  ? sodium chloride flush (NS) 0.9 % injection 10-40 mL  10-40 mL Intracatheter PRN Ventura Sellers, MD      ? Zinc Oxide (TRIPLE PASTE) 12.8 % ointment   Topical PRN Mickeal Skinner, Acey Lav, MD      ? ? ?REVIEW OF SYSTEMS:   ?Constitutional: Denies fevers, chills or abnormal weight loss ?Eyes: Denies blurriness of vision ?Ears, nose, mouth, throat, and face: Denies mucositis or sore throat ?Respiratory: Denies cough, dyspnea or wheezes ?Cardiovascular: Denies palpitation, chest discomfort or lower extremity swelling ?Gastrointestinal:  Denies nausea, constipation, diarrhea ?GU: Denies dysuria or incontinence ?Skin: Denies abnormal skin rashes ?Neurological: Per HPI ?Musculoskeletal: Denies joint pain, back or neck discomfort. No decrease in ROM ?Behavioral/Psych: Denies anxiety, disturbance in thought content, and mood instability ? ? ?PHYSICAL EXAMINATION: ?Vitals:  ?  04/06/22 0543 04/06/22 1552  ?BP: (!) 126/58 (!) 114/57  ?Pulse: 66 73  ?Resp: 16 18  ?Temp: 98.5 ?F (36.9 ?C) 98 ?F (36.7 ?C)  ?SpO2: 95% 93%  ? ? ?KPS: 60. ?General: Alert, cooperative, pleasant, in no acute distress ?Head: Craniotomy scar noted, dry and intact. ?EENT: No conjunctival injection or scleral icterus. Oral mucosa moist ?Lungs: Resp effort normal ?Cardiac: Regular rate and rhythm ?Abdomen: Soft, non-distended abdomen ?Skin: No rashes cyanosis or petechiae. ?Extremities: No clubbing or edema ? ?NEUROLOGIC EXAM: ?MMental Status: Awake, but  drowsy, attentive to examiner only with stimulation. Oriented to self and environment. Language is impaired with regards to fluency, comprehension intact to multi step.  ?Cranial Nerves: Visual acuity is grossly normal. Visual fields are full. Extra-ocular movements intact. No ptosis. Face is symmetric ?Motor: Tone and bulk are normal. Power is noted 4/5 in right arm and leg. Reflexes are symmetric, no pathologic reflexes present.  ?Sensory: Intact to light touch ?Gait: Hemiparetic ? ? ?LABORATORY DATA:  ?I have reviewed the data as listed ?Lab Results  ?Component Value Date  ? WBC 5.4 04/06/2022  ? HGB 8.8 (L) 04/06/2022  ? HCT 28.7 (L) 04/06/2022  ? MCV 92.6 04/06/2022  ? PLT 221 04/06/2022  ? ?Recent Labs  ?  04/04/22 ?0510 04/05/22 ?0092 04/06/22 ?0515  ?NA 141 142 142  ?K 3.4* 3.1* 2.9*  ?CL 94* 93* 91*  ?CO2 40* 44* >45*  ?GLUCOSE 178* 123* 155*  ?BUN 30* 34* 33*  ?CREATININE 1.03* 1.19* 1.44*  ?CALCIUM 7.9* 7.8* 7.9*  ?GFRNONAA 56* 47* 37*  ?PROT 4.9* 4.9* 4.5*  ?ALBUMIN 2.4* 2.4* 2.3*  ?AST 156* 216* 75*  ?ALT 224* 461* 281*  ?ALKPHOS 61 65 59  ?BILITOT 0.3 0.4 0.5  ? ? ?RADIOGRAPHIC STUDIES: ?I have personally reviewed the radiological images as listed and agreed with the findings in the report. ?No results found. ? ?ASSESSMENT & PLAN:  ?Primary CNS Lymphoma ? ?1)Primary CNS Lymphoma ? ?04/06/22: ?-MTX level 0.17, not safe yet for discharge ?-Repleting potassium  IV ?-Fluids at 100/hr ?-Likely DC tmrw ? ?04/05/22: ?-MTX level 0.3 ?-Still pending urine pH ?-Fluids at 100/hr ?-Will discontinue IV ceftriaxone s/p 5 days given urine culture ?-DC tmrw if MTX <0.10 ?

## 2022-04-07 ENCOUNTER — Other Ambulatory Visit (HOSPITAL_COMMUNITY): Payer: Self-pay

## 2022-04-07 ENCOUNTER — Other Ambulatory Visit: Payer: Self-pay | Admitting: Internal Medicine

## 2022-04-07 ENCOUNTER — Telehealth: Payer: Self-pay | Admitting: Internal Medicine

## 2022-04-07 ENCOUNTER — Other Ambulatory Visit: Payer: Self-pay | Admitting: Radiation Therapy

## 2022-04-07 ENCOUNTER — Encounter: Payer: Self-pay | Admitting: Internal Medicine

## 2022-04-07 DIAGNOSIS — C8589 Other specified types of non-Hodgkin lymphoma, extranodal and solid organ sites: Secondary | ICD-10-CM

## 2022-04-07 LAB — CBC WITH DIFFERENTIAL/PLATELET
Abs Immature Granulocytes: 0.03 10*3/uL (ref 0.00–0.07)
Basophils Absolute: 0 10*3/uL (ref 0.0–0.1)
Basophils Relative: 0 %
Eosinophils Absolute: 0.1 10*3/uL (ref 0.0–0.5)
Eosinophils Relative: 2 %
HCT: 28.2 % — ABNORMAL LOW (ref 36.0–46.0)
Hemoglobin: 8.9 g/dL — ABNORMAL LOW (ref 12.0–15.0)
Immature Granulocytes: 1 %
Lymphocytes Relative: 26 %
Lymphs Abs: 1.5 10*3/uL (ref 0.7–4.0)
MCH: 29.3 pg (ref 26.0–34.0)
MCHC: 31.6 g/dL (ref 30.0–36.0)
MCV: 92.8 fL (ref 80.0–100.0)
Monocytes Absolute: 0.1 10*3/uL (ref 0.1–1.0)
Monocytes Relative: 1 %
Neutro Abs: 3.9 10*3/uL (ref 1.7–7.7)
Neutrophils Relative %: 70 %
Platelets: 190 10*3/uL (ref 150–400)
RBC: 3.04 MIL/uL — ABNORMAL LOW (ref 3.87–5.11)
RDW: 15.4 % (ref 11.5–15.5)
WBC: 5.6 10*3/uL (ref 4.0–10.5)
nRBC: 0 % (ref 0.0–0.2)

## 2022-04-07 LAB — COMPREHENSIVE METABOLIC PANEL
ALT: 199 U/L — ABNORMAL HIGH (ref 0–44)
AST: 38 U/L (ref 15–41)
Albumin: 2.4 g/dL — ABNORMAL LOW (ref 3.5–5.0)
Alkaline Phosphatase: 59 U/L (ref 38–126)
Anion gap: 6 (ref 5–15)
BUN: 27 mg/dL — ABNORMAL HIGH (ref 8–23)
CO2: 43 mmol/L — ABNORMAL HIGH (ref 22–32)
Calcium: 8.2 mg/dL — ABNORMAL LOW (ref 8.9–10.3)
Chloride: 92 mmol/L — ABNORMAL LOW (ref 98–111)
Creatinine, Ser: 1.35 mg/dL — ABNORMAL HIGH (ref 0.44–1.00)
GFR, Estimated: 40 mL/min — ABNORMAL LOW (ref >60)
Glucose, Bld: 143 mg/dL — ABNORMAL HIGH (ref 70–99)
Potassium: 3.4 mmol/L — ABNORMAL LOW (ref 3.5–5.1)
Sodium: 141 mmol/L (ref 135–145)
Total Bilirubin: 0.9 mg/dL (ref 0.3–1.2)
Total Protein: 4.7 g/dL — ABNORMAL LOW (ref 6.5–8.1)

## 2022-04-07 LAB — METHOTREXATE: Methotrexate: 0.17

## 2022-04-07 MED ORDER — LEUCOVORIN CALCIUM 5 MG PO TABS
15.0000 mg | ORAL_TABLET | Freq: Four times a day (QID) | ORAL | 0 refills | Status: DC
  Filled 2022-04-07 (×2): qty 18, 2d supply, fill #0

## 2022-04-07 MED ORDER — HEPARIN SOD (PORK) LOCK FLUSH 100 UNIT/ML IV SOLN
500.0000 [IU] | INTRAVENOUS | Status: DC | PRN
  Filled 2022-04-07: qty 5

## 2022-04-07 MED ORDER — GERHARDT'S BUTT CREAM
1.0000 "application " | TOPICAL_CREAM | Freq: Two times a day (BID) | CUTANEOUS | 2 refills | Status: DC
  Filled 2022-04-07 (×2): qty 1, 1d supply, fill #0

## 2022-04-07 MED ORDER — ZINC OXIDE 12.8 % EX OINT
1.0000 "application " | TOPICAL_OINTMENT | CUTANEOUS | 0 refills | Status: DC | PRN
  Filled 2022-04-07: qty 56.7, fill #0
  Filled 2022-04-07: qty 56.7, 57d supply, fill #0

## 2022-04-07 MED ORDER — LEUCOVORIN CALCIUM INJECTION 100 MG
15.0000 mg | Freq: Four times a day (QID) | INTRAMUSCULAR | Status: DC
  Administered 2022-04-07: 16 mg via INTRAVENOUS
  Filled 2022-04-07 (×3): qty 0.8

## 2022-04-07 MED ORDER — LEUCOVORIN CALCIUM 5 MG PO TABS
15.0000 mg | ORAL_TABLET | Freq: Four times a day (QID) | ORAL | 0 refills | Status: DC
  Filled 2022-04-07: qty 18, 2d supply, fill #0

## 2022-04-07 NOTE — Care Management Important Message (Signed)
Important Message ? ?Patient Details IM Letter given to the Patient. ?Name: Isabel Gibbs ?MRN: [id number] ?Date of Birth: 02-Jun-1946 ? ? ?Medicare Important Message Given:  Yes ? ? ? ? ?Kerin Salen ?04/07/2022, 10:21 AM ?

## 2022-04-07 NOTE — Discharge Summary (Signed)
Physician Discharge Summary  ?Isabel Gibbs DUK:[id number] DOB: 03/23/1946 DOA: 03/31/2022 ? ?PCP: Karleen Hampshire., MD ? ?Admit date: 03/31/2022 ?Discharge date: 04/07/2022 ? ?Recommendations for Outpatient Follow-up:  ?Will follow up in brain tumor clinic per Dr. Mickeal Skinner  ? ?Discharge Diagnoses:  ?Primary CNS Lymphoma ? ?Discharge Condition: Stable ?Disposition: Home ? ?Diet recommendation: Heart Healthy ? ?Filed Weights  ? 03/31/22 1020  ?Weight: 191 lb 9.3 oz (86.9 kg)  ? ? ?History of present illness and hospital course: Isabel Gibbs presented for admission for cycle #2 of high dose IV methotrexate and rituximab for CNS lymphoma induction.  She tolerated treatment well without clinical complication.  We kept urine alkalinized and administered leucovorin rescue per protocol.  IV Ceftriaxone was given for 5 days and discontinued with urine culture result.  Wound care was administered to region of sacral skin breakdown.  Discharge condition is stable.  Due to MTX level 0.13, patient was discharged with 6 additional doses of oral leucovorin '15mg'$  q6 hours. ? ?Today's assessment: Sitting comfortably, no complaints. ? ?Vitals:  ?Vitals:  ? 04/06/22 2040 04/07/22 0500  ?BP: 134/74 (!) 124/57  ?Pulse: 70 72  ?Resp: 18 20  ?Temp: 98.1 ?F (36.7 ?C) 97.8 ?F (36.6 ?C)  ?SpO2: 93% 91%  ? ?KPS: 60. ?General: Alert, cooperative, pleasant, in no acute distress ?Head: Craniotomy scar noted, dry and intact. ?EENT: No conjunctival injection or scleral icterus. Oral mucosa moist ?Lungs: Resp effort normal ?Cardiac: Regular rate and rhythm ?Abdomen: Soft, non-distended abdomen ?Skin: No rashes cyanosis or petechiae. ?Extremities: No clubbing or edema ?  ?NEUROLOGIC EXAM: ?MMental Status: Awake, alert. Oriented to self and environment. Language is impaired with regards to fluency, comprehension intact to multi step.  ?Cranial Nerves: Visual acuity is grossly normal. Visual fields are full. Extra-ocular movements intact. No ptosis. Face is  symmetric ?Motor: Tone and bulk are normal. Power is noted 4/5 in right arm and leg. Reflexes are symmetric, no pathologic reflexes present.  ?Sensory: Intact to light touch ?Gait: Deferred ? ? ?Discharge Instructions ? ?Please take the leucovorin '15mg'$  every 6 hours for 6 total doses.  This will help clear the last of the methotrexate.  We will have our team arrange an MRI brain and follow up as discussed ? ?Great job this week! ? ?Ventura Sellers, MD ? ? ?No current facility-administered medications on file prior to encounter.  ? ?Current Outpatient Medications on File Prior to Encounter  ?Medication Sig Dispense Refill  ? acetaminophen (TYLENOL) 500 MG tablet Take 500-1,000 mg by mouth 2 (two) times daily as needed for mild pain.    ? alendronate (FOSAMAX) 70 MG tablet Take 70 mg by mouth every Friday.    ? CRANBERRY PO Take 1 tablet by mouth daily.    ? gabapentin (NEURONTIN) 300 MG capsule Take 900 mg by mouth See admin instructions. Take 3 capsules (900 mg) by mouth twice daily - afternoon and bedtime    ? irbesartan (AVAPRO) 150 MG tablet Take 150 mg by mouth at bedtime.    ? SYSTANE ULTRA PF 0.4-0.3 % SOLN Place 1 drop into both eyes 3 (three) times daily.    ? Vitamin D, Ergocalciferol, (DRISDOL) 1.25 MG (50000 UNIT) CAPS capsule Take 50,000 Units by mouth every Wednesday.    ? dexamethasone (DECADRON) 2 MG tablet Take 1 tablet (2 mg total) by mouth daily. (Patient not taking: Reported on 03/31/2022) 60 tablet 1  ? leucovorin (WELLCOVORIN) 15 MG tablet Take 15 mg by mouth daily. (Patient not  taking: Reported on 03/31/2022)    ? ? ? ?Allergies  ?Allergen Reactions  ? Codeine Other (See Comments)  ?  Severe GI upset ?  ? Lisinopril Cough  ?   ?  ? Hydrocodone-Acetaminophen Nausea Only  ? ? ?The results of significant diagnostics from this hospitalization (including imaging, microbiology, ancillary and laboratory) are listed below for reference.   ? ?Significant Diagnostic Studies: ?No results  found. ? ?Microbiology: ?Recent Results (from the past 240 hour(s))  ?Urine Culture     Status: Abnormal  ? Collection Time: 04/02/22  5:12 AM  ? Specimen: Urine, Clean Catch  ?Result Value Ref Range Status  ? Specimen Description   Final  ?  URINE, CLEAN CATCH ?Performed at [facility], [place] [address] [place], [place] [address] ?  ? Special Requests   Final  ?  NONE ?Performed at [facility], [place] [address] [place], [place] [address] ?  ? Culture MULTIPLE SPECIES PRESENT, SUGGEST RECOLLECTION (A)  Final  ? Report Status 04/03/2022 FINAL  Final  ?  ? ?Labs: ?Basic Metabolic Panel: ?Recent Labs  ?Lab 04/03/22 ?0455 04/04/22 ?0510 04/05/22 ?4315 04/06/22 ?4008 04/07/22 ?6761  ?NA 141 141 142 142 141  ?K 3.5 3.4* 3.1* 2.9* 3.4*  ?CL 96* 94* 93* 91* 92*  ?CO2 38* 40* 44* >45* 43*  ?GLUCOSE 191* 178* 123* 155* 143*  ?BUN 25* 30* 34* 33* 27*  ?CREATININE 0.91 1.03* 1.19* 1.44* 1.35*  ?CALCIUM 7.8* 7.9* 7.8* 7.9* 8.2*  ? ?Liver Function Tests: ?Recent Labs  ?Lab 04/03/22 ?0455 04/04/22 ?0510 04/05/22 ?9509 04/06/22 ?3267 04/07/22 ?1245  ?AST 42* 156* 216* 75* 38  ?ALT 51* 224* 461* 281* 199*  ?ALKPHOS 61 61 65 59 59  ?BILITOT 0.2* 0.3 0.4 0.5 0.9  ?PROT 5.2* 4.9* 4.9* 4.5* 4.7*  ?ALBUMIN 2.5* 2.4* 2.4* 2.3* 2.4*  ? ?No results for input(s): LIPASE, AMYLASE in the last 168 hours. ?No results for input(s): AMMONIA in the last 168 hours. ?CBC: ?Recent Labs  ?Lab 04/03/22 ?0455 04/04/22 ?0510 04/05/22 ?8099 04/06/22 ?8338 04/07/22 ?2505  ?WBC 11.5* 7.0 6.6 5.4 5.6  ?NEUTROABS 9.3* 5.6 5.2 3.9 3.9  ?HGB 10.0* 9.6* 10.4* 8.8* 8.9*  ?HCT 30.6* 30.0* 32.6* 28.7* 28.2*  ?MCV 90.5 91.2 91.6 92.6 92.8  ?PLT 247 247 238 221 190  ? ?Cardiac Enzymes: ?No results for input(s): CKTOTAL, CKMB, CKMBINDEX, TROPONINI in the last 168 hours. ?BNP: ?BNP (last 3 results) ?No results for input(s): BNP in the last 8760 hours. ? ?ProBNP (last 3 results) ?No results for input(s): PROBNP in the last 8760  hours. ? ?CBG: ?No results for input(s): GLUCAP in the last 168 hours. ? ? ?Principal Problem: ?  Primary CNS lymphoma ([place]) ?Active Problems: ?  CNS lymphoma ([place]) ?  Hypokalemia ? ? ?Time coordinating discharge: 55 minutes ? ?Signed: ?Cecil Cobbs, MD ? ?

## 2022-04-07 NOTE — Progress Notes (Signed)
Physical Therapy Treatment ?Patient Details ?Name: Isabel Gibbs ?MRN: [id number] ?DOB: 1946/11/12 ?Today's Date: 04/07/2022 ? ? ?History of Present Illness 76 y.o. female presented  for cycle of methotrexate induction+rituximab for primary cns lymphoma. PMH: HTN, prediabetes, CNS lymphoma. ? ?  ?PT Comments  ? ? Pt reclined in bed, reporting no pain, and agreeable to be seen by physical therapy. Pt max assist +2 for bed mobility and transfers. Upon sitting EOB pt demonstrated heavy lean posterior that pt was able to correct but then shifted to left lateral lean that the pt could not self correct. Pt completed three reps of sit to stand / stand to sit transfer with Stedy and max assist +2, but reported L knee pain and fatigue so further mobility deferred. If pt does not demonstrate progression with mobility and transfers during acute stay, pt would benefit from lift equipment to aid family / caregivers if she is to discharge home with family assistance. Also recommending 24/7 supervision for safety. We will continue to follow her acutely to promote progress with functional mobility and safe discharge to the destination indicated below. ?  ?Recommendations for follow up therapy are one component of a multi-disciplinary discharge planning process, led by the attending physician.  Recommendations may be updated based on patient status, additional functional criteria and insurance authorization. ? ?Follow Up Recommendations ? Home health PT (Pt will benefit from 24/7 supervision/assistance from family) ?  ?  ?Assistance Recommended at Discharge Frequent or constant Supervision/Assistance  ?Patient can return home with the following Two people to help with walking and/or transfers;A lot of help with bathing/dressing/bathroom;Assistance with cooking/housework;Help with stairs or ramp for entrance;Assist for transportation ?  ?Equipment Recommendations ? Other (comment) (May need Hoyer Lift)  ?  ?Recommendations for Other  Services   ? ? ?  ?Precautions / Restrictions Precautions ?Precautions: Fall ?Precaution Comments: wound on buttocks  ?  ? ?Mobility ? Bed Mobility ?Overal bed mobility: Needs Assistance ?Bed Mobility: Supine to Sit, Sit to Supine ?Rolling: Max assist, +2 for physical assistance ?  ?Supine to sit: Max assist, +2 for physical assistance, HOB elevated ?Sit to supine: Max assist, +2 for physical assistance ?  ?General bed mobility comments: Pt max assist +2 for physical assist for bed mobility including LE advancement off and on bed and trunk control into sitting and lying, as well as using bed pad to bring up in bed while supine. Pt reporting some pain in buttock wound during bed mobility but able to continue to with session. Upon sitting, pt demonstrating posterior lean but able to correct with tactile cuing but unable to correct L lateral lean. ?  ? ?Transfers ?Overall transfer level: Needs assistance ?Equipment used: Tax inspector used [employer]) ?Transfers: Sit to/from Stand ?Sit to Stand: +2 physical assistance, From elevated surface, Max assist ?  ?  ?  ?  ?  ?General transfer comment: Pt max assist +2 for physical assist for sit to stand and stand to sit transfer from elevated bed using Stedy. Pt able to initiate movement but reliant on heavy BUE usage as well as max assist +2 to acheive stooped standing with buttocks off bed. Pt completed transfer three times with greatest range of motion acheived on third repetition but reported fatigue and L knee pain so further mobility deferred. ?  ? ?Ambulation/Gait ?  ?  ?  ?  ?  ?  ?  ?General Gait Details: deferred ? ? ?Stairs ?  ?  ?  ?  ?  ? ? ?  Wheelchair Mobility ?  ? ?Modified Rankin (Stroke Patients Only) ?  ? ? ?  ?Balance Overall balance assessment: Needs assistance ?Sitting-balance support: Feet supported, Bilateral upper extremity supported ?Sitting balance-Leahy Scale: Poor ?Sitting balance - Comments: Pt demonstrating lean, first posterior, then L  lateral that had to be corrected/supported by PT/technician and pt unable to acheive upright sitting without support. ?Postural control: Posterior lean, Left lateral lean ?Standing balance support: Bilateral upper extremity supported, Reliant on assistive device for balance ?Standing balance-Leahy Scale: Zero ?Standing balance comment: Pt able to rise into stooped standing using Stedy and max assist +2 but cannot remain in static stance secondary to fatigue and weakness. ?  ?  ?  ?  ?  ?  ?  ?  ?  ?  ?  ?  ? ?  ?Cognition Arousal/Alertness: Awake/alert ?Behavior During Therapy: [facility] for tasks assessed/performed ?Overall Cognitive Status: Within Functional Limits for tasks assessed ?  ?  ?  ?  ?  ?  ?  ?  ?  ?  ?  ?  ?  ?  ?  ?  ?  ?  ?  ? ?  ?Exercises   ? ?  ?General Comments   ?  ?  ? ?Pertinent Vitals/Pain Pain Assessment ?Pain Assessment: Faces ?Faces Pain Scale: Hurts little more ?Pain Location: wound on buttocks, left knee ?Pain Descriptors / Indicators: Grimacing, Burning, Tender ?Pain Intervention(s): Limited activity within patient's tolerance, Monitored during session, Repositioned  ? ? ?Home Living   ?  ?  ?  ?  ?  ?  ?  ?  ?  ?   ?  ?Prior Function    ?  ?  ?   ? ?PT Goals (current goals can now be found in the care plan section) Acute Rehab PT Goals ?Patient Stated Goal: to be able to get out of bed to a chair, for buttocks wound to heal ?PT Goal Formulation: With patient/family ?Time For Goal Achievement: 04/18/22 ?Potential to Achieve Goals: Fair ?Progress towards PT goals: Progressing toward goals ? ?  ?Frequency ? ? ? Min 3X/week ? ? ? ?  ?PT Plan Current plan remains appropriate  ? ? ?Co-evaluation   ?  ?  ?  ?  ? ?  ?AM-PAC PT "6 Clicks" Mobility   ?Outcome Measure ? Help needed turning from your back to your side while in a flat bed without using bedrails?: A Lot ?Help needed moving from lying on your back to sitting on the side of a flat bed without using bedrails?: Total ?Help needed moving to  and from a bed to a chair (including a wheelchair)?: Total ?Help needed standing up from a chair using your arms (e.g., wheelchair or bedside chair)?: Total ?Help needed to walk in hospital room?: Total ?Help needed climbing 3-5 steps with a railing? : Total ?6 Click Score: 7 ? ?  ?End of Session Equipment Utilized During Treatment: Other (comment);Gait belt [facility]) ?Activity Tolerance: Patient limited by fatigue ?Patient left: in bed;with bed alarm set;with call bell/phone within reach ?Nurse Communication: Mobility status ?PT Visit Diagnosis: Difficulty in walking, not elsewhere classified (R26.2);Other abnormalities of gait and mobility (R26.89);Muscle weakness (generalized) (M62.81) ?  ? ? ?Time: 6440-3474 ?PT Time Calculation (min) (ACUTE ONLY): 19 min ? ?Charges:  $Therapeutic Activity: 8-22 mins          ?          ?Coolidge Breeze, PT, DPT ?[facility] Department ?Office: [phone] ?Pager:  [phone] ? ? ?Coolidge Breeze ?04/07/2022, 10:16 AM ? ?

## 2022-04-07 NOTE — Telephone Encounter (Signed)
Per 4/10 inbasket spoke to pt about appointment.  Pt confirmed  ?

## 2022-04-08 ENCOUNTER — Encounter: Payer: Self-pay | Admitting: Internal Medicine

## 2022-04-08 ENCOUNTER — Telehealth: Payer: Self-pay | Admitting: *Deleted

## 2022-04-08 NOTE — Telephone Encounter (Signed)
Patients son Merry Proud called concerned about in home transfers since patient being discharged. Reports she is very weak and unable to assist in moving in chair and getting up to bathroom even though they report incontinence.   ? ?I see that she was evaluated by Physical Therapy prior to discharge but no home health orders were placed and no equipment was ordered even though it was documented that the patient was a Max 2 + assist and could benefit from a hoyer lift. ? ?Patients son did confirm that patient had a Physical Therapy order started by their PCP and that evaluation was going to happen today.  I encouraged him to explain to the evaluator that they needed guidance on how to transfer, roll and provide hygiene while she is in the bed or chair. ? ?He advised that she is currently sleeping in a lift chair.  I advised that she would benefit more so from a hospital bed with position changing capabilities and a hoyer lift.  Son was agreeable to those items being ordered.  Order was placed through [facility].   ?

## 2022-04-12 ENCOUNTER — Other Ambulatory Visit: Payer: Self-pay

## 2022-04-12 ENCOUNTER — Ambulatory Visit (HOSPITAL_COMMUNITY)
Admission: RE | Admit: 2022-04-12 | Discharge: 2022-04-12 | Disposition: A | Discharge status: Home / Self Care | Payer: Medicare Other | Source: Ambulatory Visit | Attending: Internal Medicine | Admitting: Internal Medicine

## 2022-04-12 ENCOUNTER — Emergency Department (HOSPITAL_COMMUNITY): Payer: Medicare Other

## 2022-04-12 ENCOUNTER — Inpatient Hospital Stay (HOSPITAL_COMMUNITY)
Admission: EM | Admit: 2022-04-12 | Discharge: 2022-04-18 | DRG: 871 | Disposition: A | Discharge status: Skilled Nursing Facility | Payer: Medicare Other | Attending: Internal Medicine | Admitting: Internal Medicine

## 2022-04-12 ENCOUNTER — Encounter (HOSPITAL_COMMUNITY): Payer: Self-pay | Admitting: Internal Medicine

## 2022-04-12 DIAGNOSIS — Z87440 Personal history of urinary (tract) infections: Secondary | ICD-10-CM

## 2022-04-12 DIAGNOSIS — G9349 Other encephalopathy: Secondary | ICD-10-CM | POA: Diagnosis present

## 2022-04-12 DIAGNOSIS — Z6832 Body mass index (BMI) 32.0-32.9, adult: Secondary | ICD-10-CM | POA: Diagnosis not present

## 2022-04-12 DIAGNOSIS — R5381 Other malaise: Secondary | ICD-10-CM | POA: Diagnosis present

## 2022-04-12 DIAGNOSIS — I4891 Unspecified atrial fibrillation: Secondary | ICD-10-CM | POA: Diagnosis present

## 2022-04-12 DIAGNOSIS — R5383 Other fatigue: Secondary | ICD-10-CM | POA: Diagnosis not present

## 2022-04-12 DIAGNOSIS — Z833 Family history of diabetes mellitus: Secondary | ICD-10-CM

## 2022-04-12 DIAGNOSIS — R509 Fever, unspecified: Secondary | ICD-10-CM | POA: Diagnosis not present

## 2022-04-12 DIAGNOSIS — Z87891 Personal history of nicotine dependence: Secondary | ICD-10-CM | POA: Diagnosis not present

## 2022-04-12 DIAGNOSIS — R1314 Dysphagia, pharyngoesophageal phase: Secondary | ICD-10-CM | POA: Diagnosis present

## 2022-04-12 DIAGNOSIS — I10 Essential (primary) hypertension: Secondary | ICD-10-CM | POA: Diagnosis not present

## 2022-04-12 DIAGNOSIS — Z885 Allergy status to narcotic agent status: Secondary | ICD-10-CM

## 2022-04-12 DIAGNOSIS — R7303 Prediabetes: Secondary | ICD-10-CM | POA: Diagnosis not present

## 2022-04-12 DIAGNOSIS — I5031 Acute diastolic (congestive) heart failure: Secondary | ICD-10-CM | POA: Diagnosis present

## 2022-04-12 DIAGNOSIS — N3000 Acute cystitis without hematuria: Secondary | ICD-10-CM | POA: Diagnosis not present

## 2022-04-12 DIAGNOSIS — I471 Supraventricular tachycardia: Secondary | ICD-10-CM | POA: Diagnosis not present

## 2022-04-12 DIAGNOSIS — E876 Hypokalemia: Secondary | ICD-10-CM | POA: Diagnosis not present

## 2022-04-12 DIAGNOSIS — T451X5A Adverse effect of antineoplastic and immunosuppressive drugs, initial encounter: Secondary | ICD-10-CM | POA: Diagnosis present

## 2022-04-12 DIAGNOSIS — J189 Pneumonia, unspecified organism: Secondary | ICD-10-CM | POA: Diagnosis present

## 2022-04-12 DIAGNOSIS — R1310 Dysphagia, unspecified: Secondary | ICD-10-CM | POA: Diagnosis present

## 2022-04-12 DIAGNOSIS — A4181 Sepsis due to Enterococcus: Principal | ICD-10-CM | POA: Diagnosis present

## 2022-04-12 DIAGNOSIS — C8589 Other specified types of non-Hodgkin lymphoma, extranodal and solid organ sites: Secondary | ICD-10-CM | POA: Diagnosis not present

## 2022-04-12 DIAGNOSIS — I480 Paroxysmal atrial fibrillation: Secondary | ICD-10-CM | POA: Diagnosis not present

## 2022-04-12 DIAGNOSIS — R410 Disorientation, unspecified: Secondary | ICD-10-CM | POA: Diagnosis not present

## 2022-04-12 DIAGNOSIS — D6481 Anemia due to antineoplastic chemotherapy: Secondary | ICD-10-CM | POA: Diagnosis present

## 2022-04-12 DIAGNOSIS — L89621 Pressure ulcer of left heel, stage 1: Secondary | ICD-10-CM | POA: Diagnosis present

## 2022-04-12 DIAGNOSIS — Y929 Unspecified place or not applicable: Secondary | ICD-10-CM

## 2022-04-12 DIAGNOSIS — C8519 Unspecified B-cell lymphoma, extranodal and solid organ sites: Secondary | ICD-10-CM | POA: Diagnosis present

## 2022-04-12 DIAGNOSIS — J9601 Acute respiratory failure with hypoxia: Secondary | ICD-10-CM | POA: Diagnosis not present

## 2022-04-12 DIAGNOSIS — A419 Sepsis, unspecified organism: Secondary | ICD-10-CM | POA: Diagnosis present

## 2022-04-12 DIAGNOSIS — Z8249 Family history of ischemic heart disease and other diseases of the circulatory system: Secondary | ICD-10-CM

## 2022-04-12 DIAGNOSIS — N390 Urinary tract infection, site not specified: Secondary | ICD-10-CM | POA: Diagnosis present

## 2022-04-12 DIAGNOSIS — L8990 Pressure ulcer of unspecified site, unspecified stage: Secondary | ICD-10-CM | POA: Insufficient documentation

## 2022-04-12 DIAGNOSIS — Y95 Nosocomial condition: Secondary | ICD-10-CM | POA: Diagnosis present

## 2022-04-12 DIAGNOSIS — G9340 Encephalopathy, unspecified: Secondary | ICD-10-CM | POA: Diagnosis not present

## 2022-04-12 DIAGNOSIS — D630 Anemia in neoplastic disease: Secondary | ICD-10-CM | POA: Diagnosis present

## 2022-04-12 DIAGNOSIS — Z79899 Other long term (current) drug therapy: Secondary | ICD-10-CM

## 2022-04-12 DIAGNOSIS — G9341 Metabolic encephalopathy: Secondary | ICD-10-CM | POA: Diagnosis not present

## 2022-04-12 DIAGNOSIS — L89611 Pressure ulcer of right heel, stage 1: Secondary | ICD-10-CM | POA: Diagnosis not present

## 2022-04-12 DIAGNOSIS — Z20822 Contact with and (suspected) exposure to covid-19: Secondary | ICD-10-CM | POA: Diagnosis present

## 2022-04-12 DIAGNOSIS — C8339 Primary central nervous system lymphoma: Secondary | ICD-10-CM

## 2022-04-12 DIAGNOSIS — Z888 Allergy status to other drugs, medicaments and biological substances status: Secondary | ICD-10-CM

## 2022-04-12 DIAGNOSIS — R6883 Chills (without fever): Secondary | ICD-10-CM | POA: Diagnosis not present

## 2022-04-12 DIAGNOSIS — Z806 Family history of leukemia: Secondary | ICD-10-CM

## 2022-04-12 DIAGNOSIS — I110 Hypertensive heart disease with heart failure: Secondary | ICD-10-CM | POA: Diagnosis present

## 2022-04-12 DIAGNOSIS — G500 Trigeminal neuralgia: Secondary | ICD-10-CM | POA: Diagnosis not present

## 2022-04-12 LAB — COMPREHENSIVE METABOLIC PANEL
ALT: 41 U/L (ref 0–44)
AST: 15 U/L (ref 15–41)
Albumin: 2.7 g/dL — ABNORMAL LOW (ref 3.5–5.0)
Alkaline Phosphatase: 74 U/L (ref 38–126)
Anion gap: 5 (ref 5–15)
BUN: 25 mg/dL — ABNORMAL HIGH (ref 8–23)
CO2: 34 mmol/L — ABNORMAL HIGH (ref 22–32)
Calcium: 8 mg/dL — ABNORMAL LOW (ref 8.9–10.3)
Chloride: 104 mmol/L (ref 98–111)
Creatinine, Ser: 1.19 mg/dL — ABNORMAL HIGH (ref 0.44–1.00)
GFR, Estimated: 47 mL/min — ABNORMAL LOW (ref >60)
Glucose, Bld: 179 mg/dL — ABNORMAL HIGH (ref 70–99)
Potassium: 4.3 mmol/L (ref 3.5–5.1)
Sodium: 143 mmol/L (ref 135–145)
Total Bilirubin: 0.8 mg/dL (ref 0.3–1.2)
Total Protein: 5.4 g/dL — ABNORMAL LOW (ref 6.5–8.1)

## 2022-04-12 LAB — RESP PANEL BY RT-PCR (FLU A&B, COVID) ARPGX2
Influenza A by PCR: NEGATIVE
Influenza B by PCR: NEGATIVE
SARS Coronavirus 2 by RT PCR: NEGATIVE

## 2022-04-12 LAB — CBC WITH DIFFERENTIAL/PLATELET
Abs Immature Granulocytes: 0.03 10*3/uL (ref 0.00–0.07)
Basophils Absolute: 0 10*3/uL (ref 0.0–0.1)
Basophils Relative: 1 %
Eosinophils Absolute: 0.3 10*3/uL (ref 0.0–0.5)
Eosinophils Relative: 5 %
HCT: 27.9 % — ABNORMAL LOW (ref 36.0–46.0)
Hemoglobin: 8.7 g/dL — ABNORMAL LOW (ref 12.0–15.0)
Immature Granulocytes: 1 %
Lymphocytes Relative: 27 %
Lymphs Abs: 1.3 10*3/uL (ref 0.7–4.0)
MCH: 29.7 pg (ref 26.0–34.0)
MCHC: 31.2 g/dL (ref 30.0–36.0)
MCV: 95.2 fL (ref 80.0–100.0)
Monocytes Absolute: 0.8 10*3/uL (ref 0.1–1.0)
Monocytes Relative: 16 %
Neutro Abs: 2.5 10*3/uL (ref 1.7–7.7)
Neutrophils Relative %: 50 %
Platelets: 195 10*3/uL (ref 150–400)
RBC: 2.93 MIL/uL — ABNORMAL LOW (ref 3.87–5.11)
RDW: 16.7 % — ABNORMAL HIGH (ref 11.5–15.5)
WBC: 4.8 10*3/uL (ref 4.0–10.5)
nRBC: 0 % (ref 0.0–0.2)

## 2022-04-12 LAB — URINALYSIS, ROUTINE W REFLEX MICROSCOPIC
Bilirubin Urine: NEGATIVE
Glucose, UA: NEGATIVE mg/dL
Hgb urine dipstick: NEGATIVE
Ketones, ur: NEGATIVE mg/dL
Nitrite: POSITIVE — AB
Protein, ur: 30 mg/dL — AB
Specific Gravity, Urine: 1.011 (ref 1.005–1.030)
WBC, UA: >50 WBC/hpf — ABNORMAL HIGH (ref 0–5)
pH: 8 (ref 5.0–8.0)

## 2022-04-12 LAB — PROTIME-INR
INR: 1.2 (ref 0.8–1.2)
Prothrombin Time: 14.8 seconds (ref 11.4–15.2)

## 2022-04-12 LAB — CBC
HCT: 27.9 % — ABNORMAL LOW (ref 36.0–46.0)
Hemoglobin: 8.6 g/dL — ABNORMAL LOW (ref 12.0–15.0)
MCH: 29.6 pg (ref 26.0–34.0)
MCHC: 30.8 g/dL (ref 30.0–36.0)
MCV: 95.9 fL (ref 80.0–100.0)
Platelets: 189 10*3/uL (ref 150–400)
RBC: 2.91 MIL/uL — ABNORMAL LOW (ref 3.87–5.11)
RDW: 16.7 % — ABNORMAL HIGH (ref 11.5–15.5)
WBC: 4.4 10*3/uL (ref 4.0–10.5)
nRBC: 0 % (ref 0.0–0.2)

## 2022-04-12 LAB — LACTIC ACID, PLASMA: Lactic Acid, Venous: 1.7 mmol/L (ref 0.5–1.9)

## 2022-04-12 LAB — APTT: aPTT: 30 seconds (ref 24–36)

## 2022-04-12 IMAGING — DX DG CHEST 1V PORT
1 series · 1 of 1 positions shown · non-contrast
Comparison: One-view chest x-ray [DATE].

CLINICAL DATA: Questionable sepsis.

EXAM:
PORTABLE CHEST 1 VIEW

[chest ap]
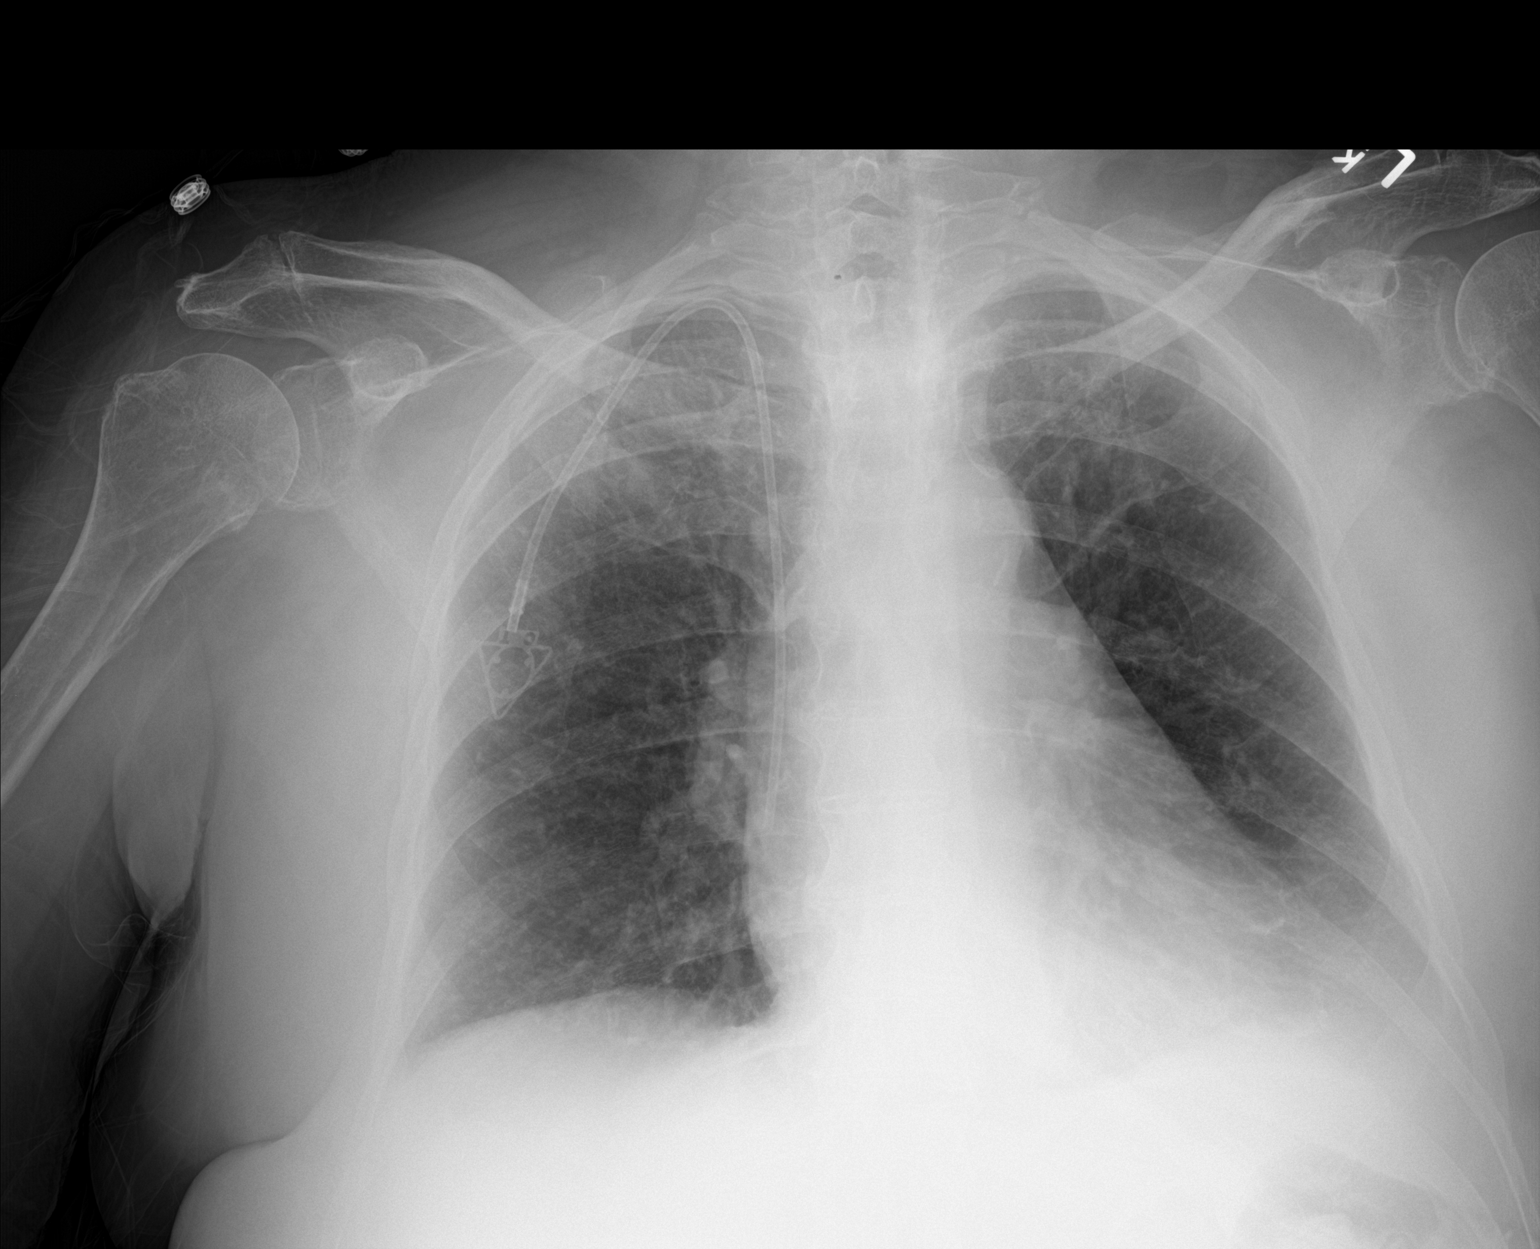

[1 of 1 positions shown; findings below may reference images not displayed]

FINDINGS: Right IJ Port-A-Cath is stable. The heart is mildly enlarged.
Ill-defined airspace opacities are present the left base and right
upper lobe. No edema or effusion is present. Visualized soft tissues
and bony thorax are unremarkable.
IMPRESSION: Ill-defined airspace opacities in the left base and right upper
lobe. While this may represent atelectasis, infection is not
excluded.

## 2022-04-12 IMAGING — MR MR HEAD WO/W CM
15 series · 48 of 48 positions shown · IV contrast (gadavist)
Comparison: Brain MRI [DATE] and earlier.

CLINICAL DATA: 76-year-old female with a history of high-grade
B-cell CNS lymphoma. Restaging.

EXAM:
MRI HEAD WITHOUT AND WITH CONTRAST
TECHNIQUE: Multiplanar, multiecho pulse sequences of the brain and surrounding
structures were obtained without and with intravenous contrast.
CONTRAST:  8mL GADAVIST GADOBUTROL 1 MMOL/ML IV SOLN

[Series 9: DWI · axial · 3.0mm · 1.36mm/px · z∈[-56,+81]mm · 6 of 96 slices shown (1 of 2)]
[im 1/96]
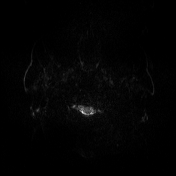
[im 20/96]
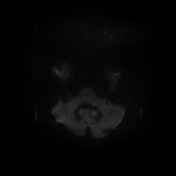
[im 39/96]
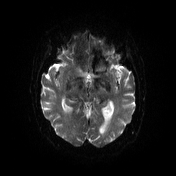
[im 58/96]
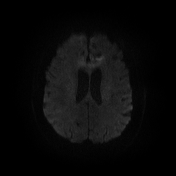
[im 77/96]
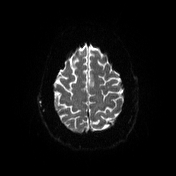
[im 96/96]
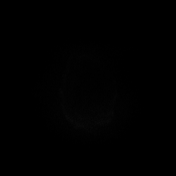

[Series 10: DWI · axial · 3.0mm · 1.36mm/px · z∈[-56,+81]mm · 3 of 48 slices shown (2 of 2)]
[im 1/48]
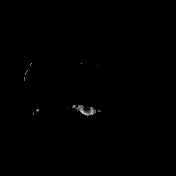
[im 24/48]
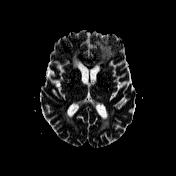
[im 48/48]
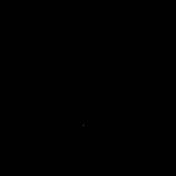

[Series 11: T1 · sagittal · 5.0mm · 0.75mm/px · 1 of 24 slices shown (1 of 4)]
[im 1/24]
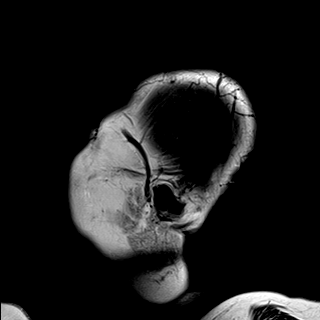

[Series 12: T2 · axial · 5.0mm · 0.62mm/px · 1 of 22 slices shown]
[im 1/22]
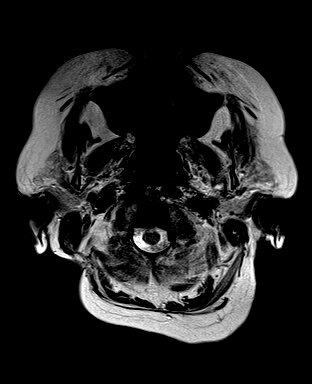

[Series 13: swi_images · axial · 3.0mm · 0.75mm/px · z∈[-90,+118]mm · 4 of 72 slices shown]
[im 1/72]
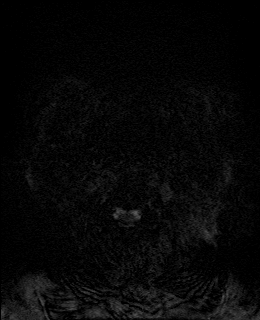
[im 24/72]
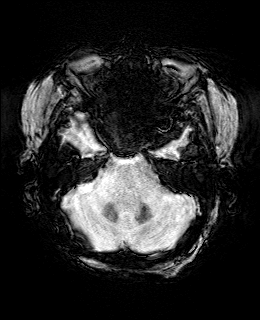
[im 48/72]
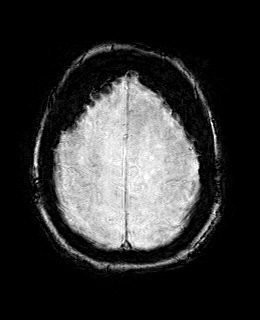
[im 72/72]
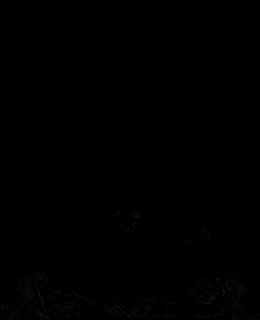

[Series 15: FLAIR · axial · 3.0mm · 0.75mm/px · z∈[-61,+89]mm · 3 of 52 slices shown]
[im 1/52]
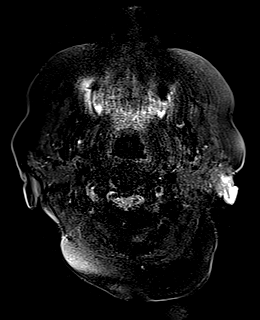
[im 26/52]
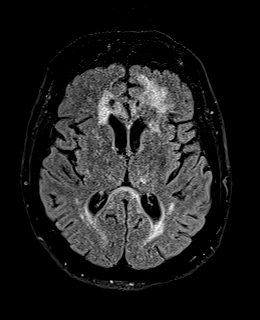
[im 52/52]
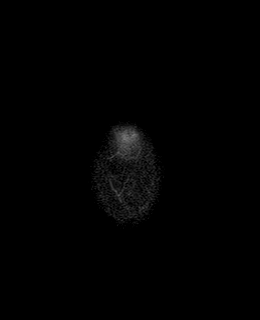

[Series 16: T1 · axial · 1.0mm · 0.94mm/px · z∈[-56,+84]mm · 8 of 144 slices shown (2 of 4)]
[im 1/144]
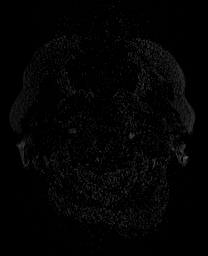
[im 21/144]
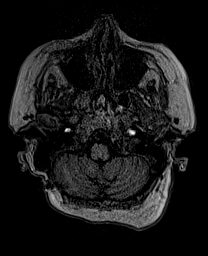
[im 41/144]
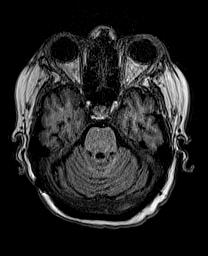
[im 62/144]
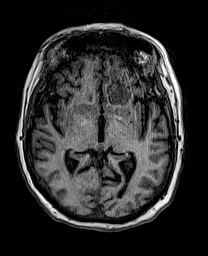
[im 82/144]
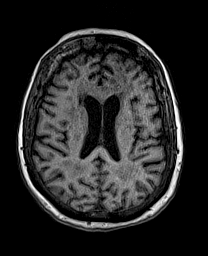
[im 103/144]
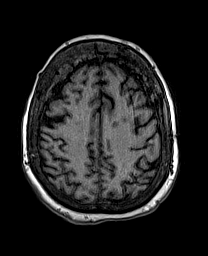
[im 123/144]
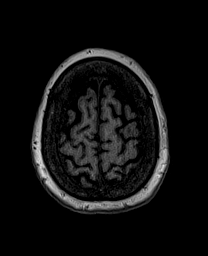
[im 144/144]
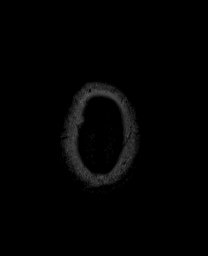

[Series 17: cor dwi_tracew · coronal · 5.0mm · 1.53mm/px · 3 of 56 slices shown]
[im 1/56]
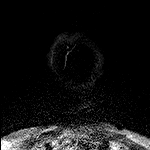
[im 28/56]
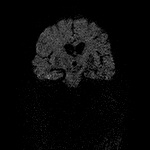
[im 56/56]
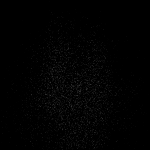

[Series 18: cor dwi_adc · coronal · 5.0mm · 1.53mm/px · 2 of 28 slices shown]
[im 1/28]
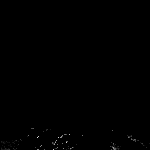
[im 28/28]
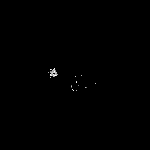

[Series 19: T2 post-contrast · coronal · 5.0mm · 0.57mm/px · 2 of 28 slices shown]
[im 1/28]
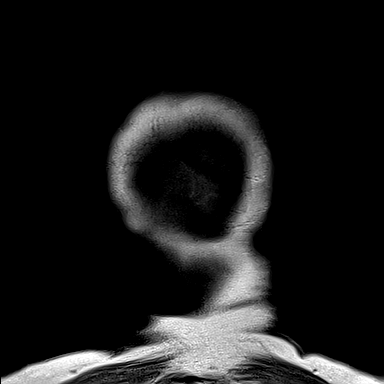
[im 28/28]
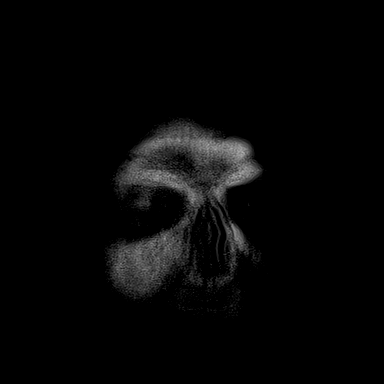

[Series 20: T1 post-contrast · axial · 1.0mm · 0.94mm/px · z∈[-56,+84]mm · 8 of 144 slices shown (1 of 3)]
[im 1/144]
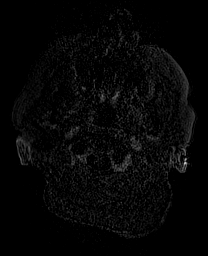
[im 21/144]
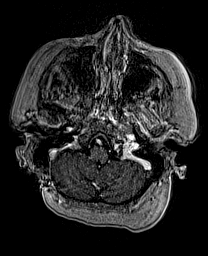
[im 41/144]
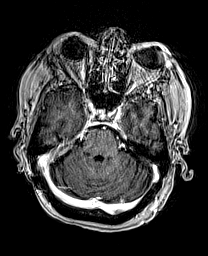
[im 62/144]
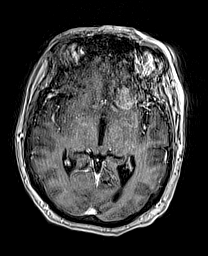
[im 82/144]
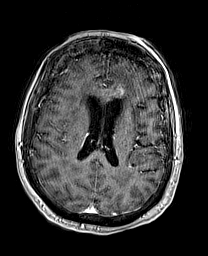
[im 103/144]
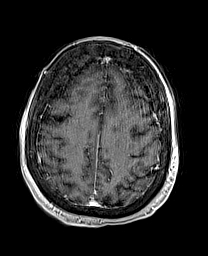
[im 123/144]
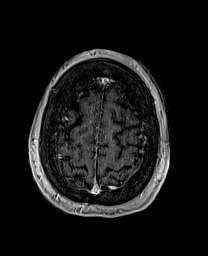
[im 144/144]
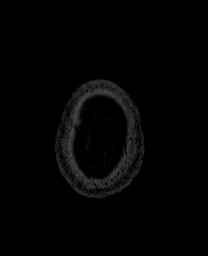

[Series 21: T1 · sagittal · 4.0mm · 0.94mm/px · 2 of 30 slices shown (3 of 4)]
[im 1/30]
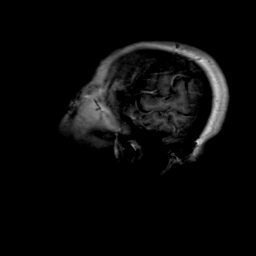
[im 30/30]
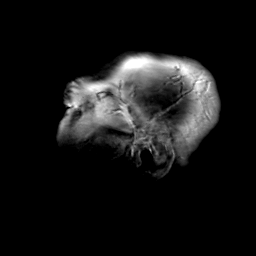

[Series 22: T1 · coronal · 4.0mm · 0.94mm/px · 2 of 30 slices shown (4 of 4)]
[im 1/30]
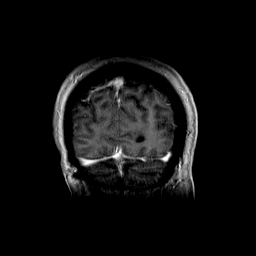
[im 30/30]
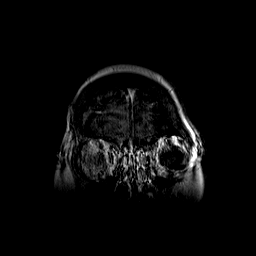

[Series 23: T1 post-contrast · coronal · 5.0mm · 0.43mm/px · 2 of 28 slices shown (2 of 3)]
[im 1/28]
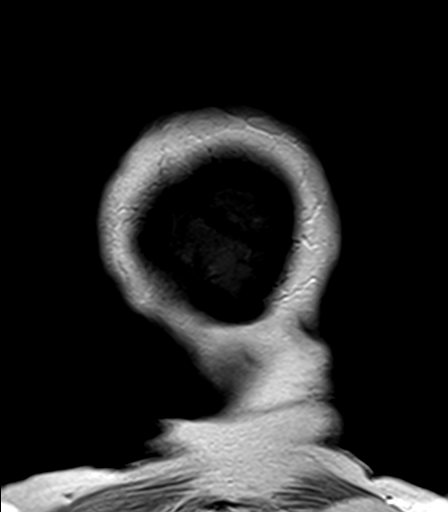
[im 28/28]
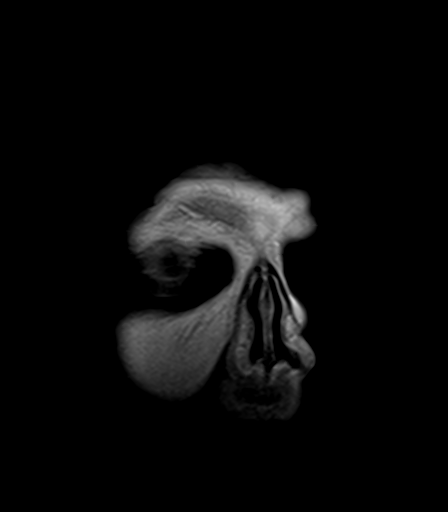

[Series 24: T1 post-contrast · sagittal · 5.0mm · 0.75mm/px · 1 of 24 slices shown (3 of 3)]
[im 1/24]
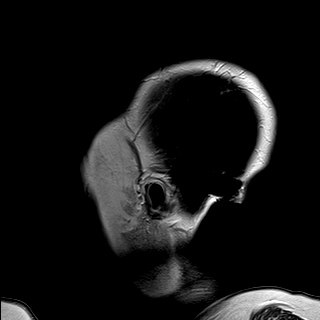

[48 of 48 positions shown; findings below may reference images not displayed]

FINDINGS: Brain: Substantial regression of tumor since last month.

Largely resolved anterior cerebral mass effect. Slightly ex vacuo
appearance of both frontal horns now.

Residual irregular, masslike T2 and FLAIR hyperintensity with
abnormal enhancement located adjacent to the both frontal horns,
greater on the left, crossing the corpus callosum, and tracking down
into the left inferior frontal gyrus (series 15, image 27 and series
20, images 68-71). Hemosiderin and susceptibility within the lesion
now is more concentrated (series 13, image 38). Continued evidence
of hypercellularity in the genu of the corpus callosum on DWI.

The largest, confluent enhancing tumor component in the left
inferior frontal gyrus now encompasses 37 by 21 x 18 mm (AP by
transverse by CC) versus 5 cm or more month.

There remains patchy abnormal basal ganglia enhancement on the right
(series 20, image 66) but that 2 has regressed.

No areas of worsening signal abnormality, enhancement, or mass
effect.

No superimposed restricted diffusion suggestive of acute infarction.
No midline shift, ventriculomegaly, extra-axial collection or acute
intracranial hemorrhage. Cervicomedullary junction and pituitary are
within normal limits.

Additional Patchy and confluent nonspecific cerebral white matter T2
and FLAIR hyperintensity in both hemispheres is stable since
[REDACTED]. No new areas of abnormal enhancement or hemosiderin.

Vascular: Major intracranial vascular flow voids are stable.
Dominant left vertebral artery. Following contrast major dural
venous sinuses are enhancing and appear to be patent.

Skull and upper cervical spine: Visible spinal cord remains within
normal limits. Degenerative ligamentous hypertrophy about the
odontoid again noted. Visualized bone marrow signal is within normal
limits. Hyperostosis frontalis.

Sinuses/Orbits: Stable and negative.

Other: Mastoids remain clear. Visible internal auditory structures
appear normal. Negative visible scalp and face.
IMPRESSION: 1. Substantial regression of CNS Lymphoma since last month.
Residual left > right anterior frontal and right basal ganglia
tumor. But virtually resolved intracranial mass effect. No new sites
of disease identified.

2. No new intracranial abnormality.

## 2022-04-12 MED ORDER — LACTATED RINGERS IV BOLUS (SEPSIS)
1000.0000 mL | Freq: Once | INTRAVENOUS | Status: AC
  Administered 2022-04-12: 1000 mL via INTRAVENOUS

## 2022-04-12 MED ORDER — ACETAMINOPHEN 650 MG RE SUPP: 650.0000 mg | Freq: Four times a day (QID) | RECTAL | Status: DC | PRN

## 2022-04-12 MED ORDER — GADOBUTROL 1 MMOL/ML IV SOLN
8.0000 mL | Freq: Once | INTRAVENOUS | Status: AC | PRN
  Administered 2022-04-12: 8 mL via INTRAVENOUS

## 2022-04-12 MED ORDER — SODIUM CHLORIDE 0.9 % IV SOLN
2.0000 g | Freq: Two times a day (BID) | INTRAVENOUS | Status: DC
  Administered 2022-04-13 – 2022-04-15 (×5): 2 g via INTRAVENOUS
  Filled 2022-04-12 (×5): qty 12.5

## 2022-04-12 MED ORDER — ACETAMINOPHEN 325 MG PO TABS
650.0000 mg | ORAL_TABLET | Freq: Once | ORAL | Status: AC
Start: 2022-04-12 — End: 2022-04-12
  Administered 2022-04-12: 650 mg via ORAL
  Filled 2022-04-12: qty 2

## 2022-04-12 MED ORDER — LACTATED RINGERS IV SOLN: INTRAVENOUS | Status: DC

## 2022-04-12 MED ORDER — SODIUM CHLORIDE 0.9 % IV SOLN
2.0000 g | Freq: Once | INTRAVENOUS | Status: AC
  Administered 2022-04-12: 2 g via INTRAVENOUS
  Filled 2022-04-12: qty 12.5

## 2022-04-12 MED ORDER — VANCOMYCIN HCL 1750 MG/350ML IV SOLN
1750.0000 mg | Freq: Once | INTRAVENOUS | Status: AC
  Administered 2022-04-12: 1750 mg via INTRAVENOUS
  Filled 2022-04-12 (×2): qty 350

## 2022-04-12 MED ORDER — ENOXAPARIN SODIUM 40 MG/0.4ML IJ SOSY
40.0000 mg | PREFILLED_SYRINGE | INTRAMUSCULAR | Status: DC
  Administered 2022-04-13 – 2022-04-18 (×6): 40 mg via SUBCUTANEOUS
  Filled 2022-04-12 (×6): qty 0.4

## 2022-04-12 MED ORDER — VANCOMYCIN HCL IN DEXTROSE 1-5 GM/200ML-% IV SOLN: 1000.0000 mg | Freq: Once | INTRAVENOUS | Status: DC

## 2022-04-12 MED ORDER — ACETAMINOPHEN 325 MG PO TABS
650.0000 mg | ORAL_TABLET | Freq: Four times a day (QID) | ORAL | Status: DC | PRN
  Administered 2022-04-13 – 2022-04-18 (×5): 650 mg via ORAL
  Filled 2022-04-12 (×6): qty 2

## 2022-04-12 MED ORDER — METRONIDAZOLE 500 MG/100ML IV SOLN
500.0000 mg | Freq: Once | INTRAVENOUS | Status: AC
  Administered 2022-04-12: 500 mg via INTRAVENOUS
  Filled 2022-04-12: qty 100

## 2022-04-12 NOTE — Progress Notes (Signed)
A consult was received from an ED physician for vancomycin and cefepime per pharmacy dosing.  The patient's profile has been reviewed for ht/wt/allergies/indication/available labs.   ?A one time order has been placed for vancomycin '1750mg'$  IV x1 and cefepime 2g IV x1.  Further antibiotics/pharmacy consults should be ordered by admitting physician if indicated.       ?                ?Thank you, ? ?Dimple Nanas, PharmD ?04/12/2022 6:25 PM ? ?

## 2022-04-12 NOTE — H&P (Incomplete)
?History and Physical  ? ? ?Patient: Isabel Gibbs CWC:[id number] DOB: 18-Jul-1946 ?DOA: 04/12/2022 ?DOS: the patient was seen and examined on 04/12/2022 ?PCP: Karleen Hampshire., MD  ?Patient coming from: {Point_of_Origin:[address]} ? ?Chief Complaint:  ?Chief Complaint  ?Patient presents with  ? Fever  ? ?HPI: Isabel Gibbs is a 76 y.o. female with medical history significant of ***  ?Review of Systems: {ROS_Text:26778} ?Past Medical History:  ?Diagnosis Date  ? Hypertension   ? Prediabetes   ? 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 ?Past Surgical History:  ?Procedure Laterality Date  ? APPLICATION OF CRANIAL NAVIGATION Left 01/02/2022  ? Procedure: APPLICATION OF CRANIAL NAVIGATION;  Surgeon: Judith Part, MD;  Location: [facility];  Service: Neurosurgery;  Laterality: Left;  ? CEREBRAL MICROVASCULAR DECOMPRESSION Right 08/02/2018  ? [place]  ? FRAMELESS  BIOPSY WITH BRAINLAB Left 01/02/2022  ? Procedure: LEFT BRAIN  BIOPSY WITH Lucky Rathke;  Surgeon: Judith Part, MD;  Location: [facility];  Service: Neurosurgery;  Laterality: Left;  ? IR IMAGING GUIDED PORT INSERTION  03/04/2022  ? RADIOLOGY WITH ANESTHESIA N/A 01/01/2022  ? Procedure: MRI of Brain with and without contrast;  Surgeon: Radiologist, Medication, MD;  Location: [facility];  Service: Radiology;  Laterality: N/A;  ? ?Social History:  reports that she quit smoking about 23 years ago. Her smoking use included cigarettes. She has never used smokeless tobacco. She reports that she does not drink alcohol and does not use drugs. ? ?Allergies  ?Allergen Reactions  ? Codeine Other (See Comments)  ?  Severe GI upset ?  ? Lisinopril Cough  ?   ?  ? Hydrocodone-Acetaminophen Nausea Only  ? ? ?Family History  ?Problem Relation Age of Onset  ? Heart failure Brother   ? Diabetes Brother   ? Leukemia Brother   ? ? ?Prior to Admission medications   ?Medication Sig Start Date End Date Taking? Authorizing Provider  ?acetaminophen (TYLENOL) 500 MG tablet Take 500-1,000 mg by mouth 2 (two)  times daily as needed for mild pain.   Yes [provider]  ?alendronate (FOSAMAX) 70 MG tablet Take 70 mg by mouth every Friday. 11/14/21  Yes [provider]  ?CRANBERRY PO Take 1 tablet by mouth daily.   Yes [provider]  ?gabapentin (NEURONTIN) 300 MG capsule Take 900 mg by mouth See admin instructions. Take 3 capsules (900 mg) by mouth twice daily - afternoon and bedtime   Yes [provider]  ?irbesartan (AVAPRO) 150 MG tablet Take 150 mg by mouth at bedtime.   Yes [provider]  ?Nystatin (GERHARDT'S BUTT CREAM) CREA Apply 1 application topically 2 (two) times daily. 04/07/22  Yes Vaslow, Acey Lav, MD  ?SYSTANE ULTRA PF 0.4-0.3 % SOLN Place 1 drop into both eyes 3 (three) times daily.   Yes [provider]  ?Vitamin D, Ergocalciferol, (DRISDOL) 1.25 MG (50000 UNIT) CAPS capsule Take 50,000 Units by mouth every Wednesday.   Yes [provider]  ?Zinc Oxide (TRIPLE PASTE) 12.8 % ointment Apply 1 application topically as needed for irritation. ?Patient not taking: Reported on 04/12/2022 04/07/22   Ventura Sellers, MD  ? ? ?Physical Exam: ?Vitals:  ? 04/12/22 1930 04/12/22 2000 04/12/22 2030 04/12/22 2122  ?BP: (!) 153/73 132/70 (!) 145/81   ?Pulse: 84 89 70   ?Resp: '20 19 20   '$ ?Temp:    98.3 ?F (36.8 ?C)  ?TempSrc:    Oral  ?SpO2: 95% 95% 94%   ? ?*** ?  Data Reviewed: ?{Tip this will not be part of the note when signed- Document your independent interpretation of telemetry tracing, EKG, lab, Radiology test or any other diagnostic tests. Add any new diagnostic test ordered today. ?(Optional):26781} ?{Results:26384} ? ?Assessment and Plan: ?No notes have been filed under this hospital service. ?Service: Hospitalist ? ? ? ? Advance Care Planning:   Code Status: Prior *** ? ?Consults: *** ? ?Family Communication: *** ? ?Severity of Illness: ?{Observation/Inpatient:21159} ? ?Author: ?Barbette Merino, MD ?04/12/2022 10:01 PM ? ?For on call review  [email or web address].  ?

## 2022-04-12 NOTE — H&P (Signed)
?History and Physical  ? ? ?Patient: Isabel Gibbs VOZ:[id number] DOB: [birth year] ?DOA: 04/12/2022 ?DOS: the patient was seen and examined on 04/12/2022 ?PCP: Karleen Hampshire., MD  ?Patient coming from: Home ? ?Chief Complaint:  ?Chief Complaint  ?Patient presents with  ? Fever  ? ?HPI: Isabel Gibbs is a 76 y.o. female with medical history significant of CNS lymphoma, essential hypertension and prediabetes who presented to the ER with fever for 2 days.  Also generalized weakness.  Denied any cough denied any nausea vomiting or diarrhea.  Patient has been on treatment for her lymphoma.  She was recently in rehab good discharged on April 3.  She has been undergoing physical therapy.  She is currently on methotrexate with induction rituximab with the last treatment 5 days ago.  Patient came in on was seen and evaluated.  Initial temperature rectally was 101.  She seems to have met sepsis criteria.  Chest x-ray suggested pneumonia and urinalysis.  Suggested UTI.  Patient is therefore being admitted with sepsis syndrome.  ? ?Review of Systems: As mentioned in the history of present illness. All other systems reviewed and are negative. ?Past Medical History:  ?Diagnosis Date  ? Hypertension   ? Prediabetes   ? 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 ?Past Surgical History:  ?Procedure Laterality Date  ? APPLICATION OF CRANIAL NAVIGATION Left 01/02/2022  ? Procedure: APPLICATION OF CRANIAL NAVIGATION;  Surgeon: Judith Part, MD;  Location: [facility];  Service: Neurosurgery;  Laterality: Left;  ? CEREBRAL MICROVASCULAR DECOMPRESSION Right 08/02/2018  ? [place]  ? FRAMELESS  BIOPSY WITH BRAINLAB Left 01/02/2022  ? Procedure: LEFT BRAIN  BIOPSY WITH Lucky Rathke;  Surgeon: Judith Part, MD;  Location: [facility];  Service: Neurosurgery;  Laterality: Left;  ? IR IMAGING GUIDED PORT INSERTION  03/04/2022  ? RADIOLOGY WITH ANESTHESIA N/A 01/01/2022  ? Procedure: MRI of Brain with and without contrast;  Surgeon: Radiologist, Medication, MD;   Location: [facility];  Service: Radiology;  Laterality: N/A;  ? ?Social History:  reports that she quit smoking about 23 years ago. Her smoking use included cigarettes. She has never used smokeless tobacco. She reports that she does not drink alcohol and does not use drugs. ? ?Allergies  ?Allergen Reactions  ? Codeine Other (See Comments)  ?  Severe GI upset ?  ? Lisinopril Cough  ?   ?  ? Hydrocodone-Acetaminophen Nausea Only  ? ? ?Family History  ?Problem Relation Age of Onset  ? Heart failure Brother   ? Diabetes Brother   ? Leukemia Brother   ? ? ?Prior to Admission medications   ?Medication Sig Start Date End Date Taking? Authorizing Provider  ?acetaminophen (TYLENOL) 500 MG tablet Take 500-1,000 mg by mouth 2 (two) times daily as needed for mild pain.   Yes [provider]  ?alendronate (FOSAMAX) 70 MG tablet Take 70 mg by mouth every Friday. 11/14/21  Yes [provider]  ?CRANBERRY PO Take 1 tablet by mouth daily.   Yes [provider]  ?gabapentin (NEURONTIN) 300 MG capsule Take 900 mg by mouth See admin instructions. Take 3 capsules (900 mg) by mouth twice daily - afternoon and bedtime   Yes [provider]  ?irbesartan (AVAPRO) 150 MG tablet Take 150 mg by mouth at bedtime.   Yes [provider]  ?Nystatin (GERHARDT'S BUTT CREAM) CREA Apply 1 application topically 2 (two) times daily. 04/07/22  Yes Vaslow, Acey Lav, MD  ?Merri Brunette PF 0.4-0.3 %  SOLN Place 1 drop into both eyes 3 (three) times daily.   Yes [provider]  ?Vitamin D, Ergocalciferol, (DRISDOL) 1.25 MG (50000 UNIT) CAPS capsule Take 50,000 Units by mouth every Wednesday.   Yes [provider]  ?Zinc Oxide (TRIPLE PASTE) 12.8 % ointment Apply 1 application topically as needed for irritation. ?Patient not taking: Reported on 04/12/2022 04/07/22   Ventura Sellers, MD  ? ? ?Physical Exam: ?Vitals:  ? 04/12/22 2000 04/12/22 2030 04/12/22 2122 04/12/22 2217  ?BP: 132/70 (!) 145/81   130/61  ?Pulse: 89 70  87  ?Resp: _0 ?Temp:   98.3 ?F (36.8 ?C) 97.7 ?F (36.5 ?C)  ?TempSrc:   Oral Oral  ?SpO2: 95% 94%  95%  ? ?General: Chronically ill looking, awake and alert pleasant no distress ?HEENT:.  PERRL, EOMI, no pallor no jaundice no rhinorrhea ?Neck: Supple, no JVD no lymphadenopathy ?Respiratory: Good air entry bilaterally no wheeze no rales or crackles ?Cardiovascular system: Regular rate and rhythm ?Abdomen: Soft, nontender with positive bowel sounds ?Extremities: Trace edema ? ?Data Reviewed: ? ?Urinalysis is positive for nitrites and leukocytes.  Few bacteria and WBC more than 50.  Chest x-ray showed ill-defined airspace opacity in the left base and right upper lobe.  MRI of the brain done currently results pending.  Creatinine is 1.19.  Glucose 179.  25. ? ?Assessment and Plan: ? ? ?#1 sepsis syndrome: Patient meets sepsis criteria with temperature 101, respiratory rate of 22 on arrival, evidence of UTI.  Also evidence of pneumonia.  With recent hospitalization with appears this could be HCAP.  Patient will be admitted and initiated on IV vancomycin and cefepime.  Blood cultures have been obtained.  Urine cultures have been obtained and will follow results. ? ?#2 CNS lymphoma: Undergoing chemotherapy.  Patient has corresponding anemia but no thrombocytopenia.  Continue to monitor ? ?#3 essential hypertension: Continue to monitor blood pressure.  May go ahead and resume home regimen once med rec is completed. ? ?#4 morbid obesity: Dietary counseling. ? ?#5 UTI: As per #1 above. ? ?#6 pneumonia: Will be treated as HCAP. ? ? Advance Care Planning:   Code Status: Full Code full code ? ?Consults: None ? ?Family Communication: No family at bedside ? ?Severity of Illness: ?The appropriate patient status for this patient is INPATIENT. Inpatient status is judged to be reasonable and necessary in order to provide the required intensity of service to ensure the patient's safety. The patient's  presenting symptoms, physical exam findings, and initial radiographic and laboratory data in the context of their chronic comorbidities is felt to place them at high risk for further clinical deterioration. Furthermore, it is not anticipated that the patient will be medically stable for discharge from the hospital within 2 midnights of admission.  ? ?* I certify that at the point of admission it is my clinical judgment that the patient will require inpatient hospital care spanning beyond 2 midnights from the point of admission due to high intensity of service, high risk for further deterioration and high frequency of surveillance required.* ? ?Author: ?Barbette Merino, MD ?04/12/2022 11:49 PM ? ?For on call review [email or web address].  ?

## 2022-04-12 NOTE — ED Provider Notes (Signed)
?[facility] DEPT ?Provider Note ? ? ?CSN: [id number] ?Arrival date & time: 04/12/22  1801 ? ?  ? ?History ? ?Chief Complaint  ?Patient presents with  ? Fever  ? ? ?Isabel Gibbs is a 76 y.o. female. ? ?76 year old female presents with fever x2 days.  History of UTIs in the past.  Denies any URI symptoms.  Patient has a history of CNS lymphoma and was discharged from hospital 5 days ago.  Had a UTI at that point.  Notes that she has had decreased mobility and has had trouble standing up.  Could normally use a walker but cannot do that at this time.  Temperature was 101.1.  Presents for further evaluation ? ? ?  ? ?Home Medications ?Prior to Admission medications   ?Medication Sig Start Date End Date Taking? Authorizing Provider  ?acetaminophen (TYLENOL) 500 MG tablet Take 500-1,000 mg by mouth 2 (two) times daily as needed for mild pain.    [provider]  ?alendronate (FOSAMAX) 70 MG tablet Take 70 mg by mouth every Friday. 11/14/21   [provider]  ?CRANBERRY PO Take 1 tablet by mouth daily.    [provider]  ?gabapentin (NEURONTIN) 300 MG capsule Take 900 mg by mouth See admin instructions. Take 3 capsules (900 mg) by mouth twice daily - afternoon and bedtime    [provider]  ?irbesartan (AVAPRO) 150 MG tablet Take 150 mg by mouth at bedtime.    [provider]  ?Nystatin (GERHARDT'S BUTT CREAM) CREA Apply 1 application topically 2 (two) times daily. 04/07/22   Vaslow, Acey Lav, MD  ?SYSTANE ULTRA PF 0.4-0.3 % SOLN Place 1 drop into both eyes 3 (three) times daily.    [provider]  ?Vitamin D, Ergocalciferol, (DRISDOL) 1.25 MG (50000 UNIT) CAPS capsule Take 50,000 Units by mouth every Wednesday.    [provider]  ?Zinc Oxide (TRIPLE PASTE) 12.8 % ointment Apply 1 application topically as needed for irritation. 04/07/22   Ventura Sellers, MD  ?   ? ?Allergies    ?Codeine, Lisinopril, and Hydrocodone-acetaminophen    ? ?Review of Systems   ?Review of Systems  ?All other systems reviewed and are negative. ? ?Physical Exam ?Updated Vital Signs ?BP (!) 113/52 (BP Location: Right Arm)   Pulse 92   Temp (!) 101.1 ?F (38.4 ?C) (Rectal)   Resp 18   SpO2 95%  ?Physical Exam ?Vitals and nursing note reviewed.  ?Constitutional:   ?   General: She is not in acute distress. ?   Appearance: Normal appearance. She is well-developed. She is not toxic-appearing.  ?HENT:  ?   Head: Normocephalic and atraumatic.  ?Eyes:  ?   General: Lids are normal.  ?   Conjunctiva/sclera: Conjunctivae normal.  ?   Pupils: Pupils are equal, round, and reactive to light.  ?Neck:  ?   Thyroid: No thyroid mass.  ?   Trachea: No tracheal deviation.  ?Cardiovascular:  ?   Rate and Rhythm: Normal rate and regular rhythm.  ?   Heart sounds: Normal heart sounds. No murmur heard. ?  No gallop.  ?Pulmonary:  ?   Effort: Pulmonary effort is normal. No respiratory distress.  ?   Breath sounds: Normal breath sounds. No stridor. No decreased breath sounds, wheezing, rhonchi or rales.  ?Abdominal:  ?   General: There is no distension.  ?   Palpations: Abdomen is soft.  ?   Tenderness: There is no abdominal tenderness. There  is no rebound.  ?Musculoskeletal:     ?   General: No tenderness. Normal range of motion.  ?   Cervical back: Normal range of motion and neck supple.  ?     Legs: ? ?Skin: ?   General: Skin is warm and dry.  ?   Findings: No abrasion or rash.  ?Neurological:  ?   Mental Status: She is alert and oriented to person, place, and time. Mental status is at baseline.  ?   GCS: GCS eye subscore is 4. GCS verbal subscore is 5. GCS motor subscore is 6.  ?   Cranial Nerves: No cranial nerve deficit.  ?   Sensory: No sensory deficit.  ?   Motor: Weakness present.  ?   Comments: Strength is 2/5 bilateral lower extremities.  5 of 5 in upper extremities  ?Psychiatric:     ?   Attention and Perception: Attention normal.     ?   Speech: Speech normal.     ?    Behavior: Behavior normal.  ? ? ?ED Results / Procedures / Treatments   ?Labs ?(all labs ordered are listed, but only abnormal results are displayed) ?Labs Reviewed  ?RESP PANEL BY RT-PCR (FLU A&B, COVID) [id number]  ?CULTURE, BLOOD (ROUTINE X 2)  ?CULTURE, BLOOD (ROUTINE X 2)  ?URINE CULTURE  ?LACTIC ACID, PLASMA  ?LACTIC ACID, PLASMA  ?COMPREHENSIVE METABOLIC PANEL  ?CBC WITH DIFFERENTIAL/PLATELET  ?PROTIME-INR  ?APTT  ?URINALYSIS, ROUTINE W REFLEX MICROSCOPIC  ? ? ?EKG ?None ? ?Radiology ?No results found. ? ?Procedures ?Procedures  ? ? ?Medications Ordered in ED ?Medications  ?lactated ringers infusion (has no administration in time range)  ?lactated ringers bolus 1,000 mL (has no administration in time range)  ?  And  ?lactated ringers bolus 1,000 mL (has no administration in time range)  ?  And  ?lactated ringers bolus 1,000 mL (has no administration in time range)  ?ceFEPIme (MAXIPIME) 2 g in sodium chloride 0.9 % 100 mL IVPB (has no administration in time range)  ?metroNIDAZOLE (FLAGYL) IVPB 500 mg (has no administration in time range)  ?vancomycin (VANCOREADY) IVPB 1750 mg/350 mL (has no administration in time range)  ?acetaminophen (TYLENOL) tablet 650 mg (has no administration in time range)  ? ? ?ED Course/ Medical Decision Making/ A&P ?  ?                        ?Medical Decision Making ?Amount and/or Complexity of Data Reviewed ?Labs: ordered. ?Radiology: ordered. ?ECG/medicine tests: ordered. ? ?Risk ?OTC drugs. ?Prescription drug management. ? ? ?Patient treated with Tylenol for her fever here.  Chest x-ray per my interpretation consistent with pneumonia.  Code sepsis started and patient given IV fluid bolus as well as IV antibiotics.  Urinalysis pending at this time.  Discussed with Dr. Mcarthur Rossetti from the hospitalist service and patient to be admitted.  Patient and her son were notified of her status ? ? ? ? ? ? ? ?Final Clinical Impression(s) / ED Diagnoses ?Final diagnoses:  ?None  ? ? ?Rx / DC  Orders ?ED Discharge Orders   ? ? None  ? ?  ? ? ?  ?Lacretia Leigh, MD ?04/12/22 2048 ? ?

## 2022-04-12 NOTE — ED Triage Notes (Addendum)
Pt with fever x 2 days. Under tx for lymphoma.  Recently admitted for treatment and discharged and has been working with physical therapy at home.  Pt is too weak to stand or even move extremities on her own.  Rectal temp at time of triage 101.1.  No reported urinary or bowel symptoms. No n/v/d  or shob.  ?Does report "sores on bottom" after last admission ?

## 2022-04-12 NOTE — Sepsis Progress Note (Signed)
[facility] continues to monitor Sepsis Protocol ?

## 2022-04-13 ENCOUNTER — Inpatient Hospital Stay (HOSPITAL_COMMUNITY): Payer: Medicare Other

## 2022-04-13 DIAGNOSIS — J189 Pneumonia, unspecified organism: Secondary | ICD-10-CM

## 2022-04-13 LAB — RENAL FUNCTION PANEL
Albumin: 2.5 g/dL — ABNORMAL LOW (ref 3.5–5.0)
Anion gap: 5 (ref 5–15)
BUN: 23 mg/dL (ref 8–23)
CO2: 31 mmol/L (ref 22–32)
Calcium: 8.1 mg/dL — ABNORMAL LOW (ref 8.9–10.3)
Chloride: 106 mmol/L (ref 98–111)
Creatinine, Ser: 1.25 mg/dL — ABNORMAL HIGH (ref 0.44–1.00)
GFR, Estimated: 44 mL/min — ABNORMAL LOW (ref >60)
Glucose, Bld: 124 mg/dL — ABNORMAL HIGH (ref 70–99)
Phosphorus: 3.5 mg/dL (ref 2.5–4.6)
Potassium: 3.5 mmol/L (ref 3.5–5.1)
Sodium: 142 mmol/L (ref 135–145)

## 2022-04-13 LAB — COMPREHENSIVE METABOLIC PANEL
ALT: 39 U/L (ref 0–44)
AST: 17 U/L (ref 15–41)
Albumin: 2.8 g/dL — ABNORMAL LOW (ref 3.5–5.0)
Alkaline Phosphatase: 77 U/L (ref 38–126)
Anion gap: 8 (ref 5–15)
BUN: 23 mg/dL (ref 8–23)
CO2: 30 mmol/L (ref 22–32)
Calcium: 8.3 mg/dL — ABNORMAL LOW (ref 8.9–10.3)
Chloride: 104 mmol/L (ref 98–111)
Creatinine, Ser: 1.13 mg/dL — ABNORMAL HIGH (ref 0.44–1.00)
GFR, Estimated: 50 mL/min — ABNORMAL LOW (ref >60)
Glucose, Bld: 174 mg/dL — ABNORMAL HIGH (ref 70–99)
Potassium: 4.6 mmol/L (ref 3.5–5.1)
Sodium: 142 mmol/L (ref 135–145)
Total Bilirubin: 0.9 mg/dL (ref 0.3–1.2)
Total Protein: 5.7 g/dL — ABNORMAL LOW (ref 6.5–8.1)

## 2022-04-13 LAB — RESPIRATORY PANEL BY PCR

## 2022-04-13 LAB — CREATININE, SERUM
Creatinine, Ser: 1.05 mg/dL — ABNORMAL HIGH (ref 0.44–1.00)
GFR, Estimated: 54 mL/min — ABNORMAL LOW (ref >60)

## 2022-04-13 LAB — D-DIMER, QUANTITATIVE: D-Dimer, Quant: 2.46 ug/mL-FEU — ABNORMAL HIGH (ref 0.00–0.50)

## 2022-04-13 LAB — AMMONIA: Ammonia: 16 umol/L (ref 9–35)

## 2022-04-13 LAB — CBC
HCT: 26.7 % — ABNORMAL LOW (ref 36.0–46.0)
Hemoglobin: 8.1 g/dL — ABNORMAL LOW (ref 12.0–15.0)
MCH: 28.8 pg (ref 26.0–34.0)
MCHC: 30.3 g/dL (ref 30.0–36.0)
MCV: 95 fL (ref 80.0–100.0)
Platelets: 205 10*3/uL (ref 150–400)
RBC: 2.81 MIL/uL — ABNORMAL LOW (ref 3.87–5.11)
RDW: 16.7 % — ABNORMAL HIGH (ref 11.5–15.5)
WBC: 5.4 10*3/uL (ref 4.0–10.5)
nRBC: 0 % (ref 0.0–0.2)

## 2022-04-13 LAB — PROCALCITONIN: Procalcitonin: <0.1 ng/mL

## 2022-04-13 LAB — PROTIME-INR
INR: 1.2 (ref 0.8–1.2)
Prothrombin Time: 14.6 seconds (ref 11.4–15.2)

## 2022-04-13 LAB — LACTIC ACID, PLASMA: Lactic Acid, Venous: 1.2 mmol/L (ref 0.5–1.9)

## 2022-04-13 LAB — CORTISOL-AM, BLOOD: Cortisol - AM: 30.7 ug/dL — ABNORMAL HIGH (ref 6.7–22.6)

## 2022-04-13 LAB — MAGNESIUM: Magnesium: 2.1 mg/dL (ref 1.7–2.4)

## 2022-04-13 LAB — MRSA NEXT GEN BY PCR, NASAL: MRSA by PCR Next Gen: NOT DETECTED

## 2022-04-13 IMAGING — DX DG CHEST 1V PORT
1 series · 1 of 1 positions shown · non-contrast
Comparison: [DATE]

CLINICAL DATA: Cough

EXAM:
PORTABLE CHEST 1 VIEW

[chest ap]
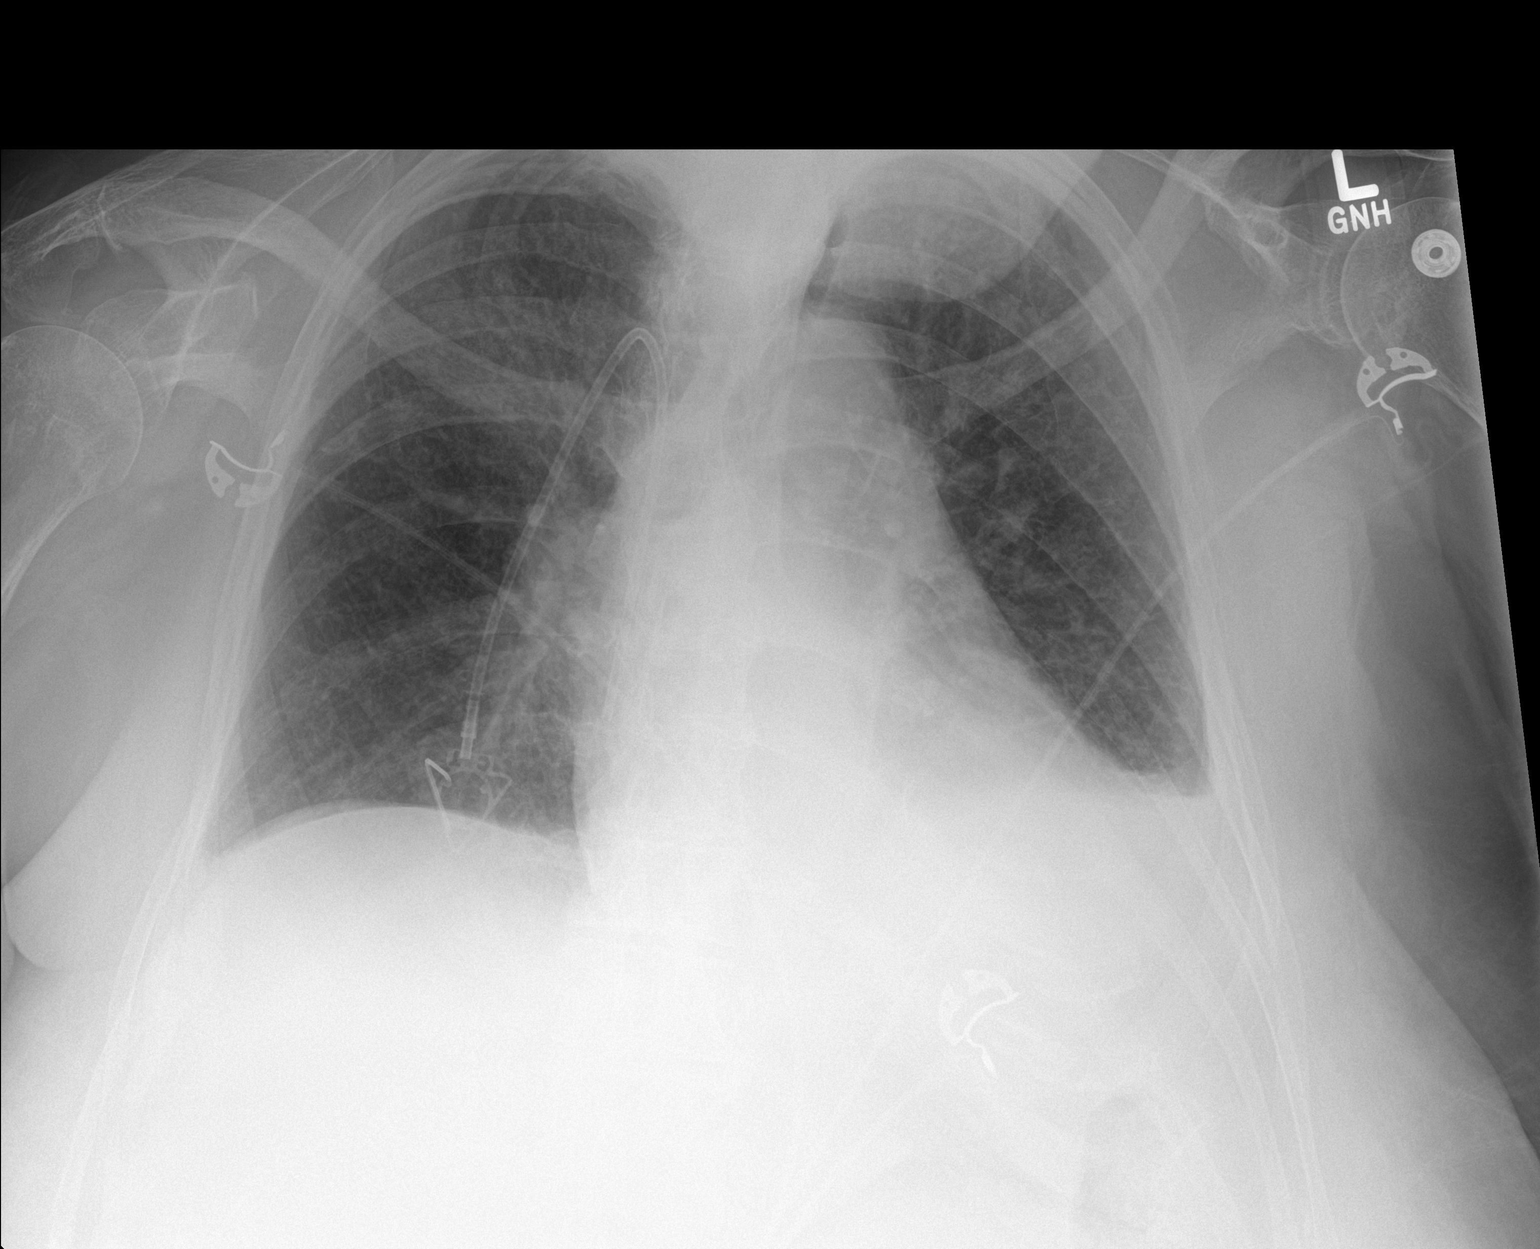

[1 of 1 positions shown; findings below may reference images not displayed]

FINDINGS: Transverse diameter of heart is increased. There are no signs of
alveolar pulmonary edema. Increased density is seen in the left
lower lung fields with blunting of left lateral CP angle. There is
possible minimal blunting of right lateral CP angle. There is slight
prominence of interstitial markings in the right upper lung fields.
There is no pneumothorax.
IMPRESSION: Increased density in the right upper and left lower lung fields may
suggest atelectasis/pneumonia. Small left pleural effusion is seen.
There is possible minimal right pleural effusion.

## 2022-04-13 MED ORDER — GABAPENTIN 300 MG PO CAPS
900.0000 mg | ORAL_CAPSULE | Freq: Two times a day (BID) | ORAL | Status: DC
  Administered 2022-04-13 – 2022-04-18 (×9): 900 mg via ORAL
  Filled 2022-04-13 (×9): qty 3

## 2022-04-13 MED ORDER — IRBESARTAN 150 MG PO TABS
150.0000 mg | ORAL_TABLET | Freq: Every day | ORAL | Status: DC
  Administered 2022-04-14: 150 mg via ORAL
  Filled 2022-04-13: qty 1

## 2022-04-13 MED ORDER — CHLORHEXIDINE GLUCONATE CLOTH 2 % EX PADS
6.0000 | MEDICATED_PAD | Freq: Every day | CUTANEOUS | Status: DC
  Administered 2022-04-13 – 2022-04-18 (×6): 6 via TOPICAL

## 2022-04-13 MED ORDER — VANCOMYCIN HCL 1250 MG/250ML IV SOLN: 1250.0000 mg | INTRAVENOUS | Status: DC

## 2022-04-13 MED ORDER — POLYVINYL ALCOHOL 1.4 % OP SOLN
1.0000 [drp] | OPHTHALMIC | Status: DC | PRN
  Filled 2022-04-13: qty 15

## 2022-04-13 MED ORDER — SODIUM CHLORIDE 0.9% FLUSH: 10.0000 mL | INTRAVENOUS | Status: DC | PRN

## 2022-04-13 NOTE — Hospital Course (Signed)
76 y.o. female with medical history significant of CNS lymphoma, essential hypertension and prediabetes who presented to the ER with fever for 2 days.  Also generalized weakness.  Denied any cough denied any nausea vomiting or diarrhea.  Patient has been on treatment for her lymphoma.  She was recently in rehab good discharged on April 3.  She has been undergoing physical therapy.  She is currently on methotrexate with induction rituximab with the last treatment 5 days ago.  Patient came in on was seen and evaluated.  Initial temperature rectally was 101.  She seems to have met sepsis criteria.  Chest x-ray suggested pneumonia and urinalysis.  Suggested UTI.  Patient is therefore being admitted with sepsis syndrome.  ?  ?4/16 on 1L [place] 92-94%; family came by; patient coughing with fluid intake, will request SLP evaluation. Repeat CXR, check Ddimer given ongoing tachycardia/hypoxia.  ?

## 2022-04-13 NOTE — Assessment & Plan Note (Signed)
Undergoing chemotherapy.  Patient has corresponding anemia but no thrombocytopenia.  Continue to monitor ?Monitor electrolytes, improve nutrition, PT eval ?

## 2022-04-13 NOTE — Assessment & Plan Note (Addendum)
Patient meets sepsis criteria with temperature 101, respiratory rate of 22 on arrival, evidence of UTI.  Also evidence of pneumonia.  With recent hospitalization with appears this could be HCAP. Patient will be admitted and initiated on IV vancomycin and cefepime. ? ?Blood cultures have been obtained.  Urine cultures have been obtained and will follow results. ? ?

## 2022-04-13 NOTE — Assessment & Plan Note (Addendum)
Continue pulm hygeine ?Wean o2 as tolerated ?OOB as much as tolerated to chair ?Sepsis likely from LRI or UTI, will continue to monitor ?If tachycardia/hypoxia worsen consider CTA Chest,  ?(she has received 3L IVF in the last 24H) ? ? ?

## 2022-04-13 NOTE — Progress Notes (Signed)
Pharmacy Antibiotic Note ? ?Isabel Gibbs is a 76 y.o. female admitted on 04/12/2022 with pneumonia & UTI.  Pharmacy has been consulted for Vancomycin & Cefepime dosing. ? ?Plan: ?Cefepime 2gm IV q12h ?Vancomycin '1250mg'$  IV q24h to target AUC 400-550 ?Check MRSA PCR- consider d/c Vanc if negative ?Monitor renal function and cx data  ? ?Height: '5\' 7"'$  (170.2 cm) ?Weight: 95.3 kg (210 lb 1.6 oz) ?IBW/kg (Calculated) : 61.6 ? ?Temp (24hrs), Avg:99 ?F (37.2 ?C), Min:97.7 ?F (36.5 ?C), Max:101.1 ?F (38.4 ?C) ? ?Recent Labs  ?Lab 04/06/22 ?4825 04/07/22 ?0037 04/12/22 ?1845 04/12/22 ?2302  ?WBC 5.4 5.6 4.8 4.4  ?CREATININE 1.44* 1.35* 1.19* 1.05*  ?LATICACIDVEN  --   --  1.7 1.2  ?  ?Estimated Creatinine Clearance: 52.4 mL/min (A) (by C-G formula based on SCr of 1.05 mg/dL (H)).   ? ?Allergies  ?Allergen Reactions  ? Codeine Other (See Comments)  ?  Severe GI upset ?  ? Lisinopril Cough  ?   ?  ? Hydrocodone-Acetaminophen Nausea Only  ? ? ?Antimicrobials this admission: ?4/15 Cefepime >>  ?4/15 Vancomycin >>  ?4/15 Metronidazole >> ? ?Dose adjustments this admission: ? ?Microbiology results: ?4/15 BCx:  ?4/15 UCx:   ?MRSA PCR:  ? ?Thank you for allowing pharmacy to be a part of this patient?s care. ? ?Netta Cedars PharmD ?04/13/2022 1:42 AM ? ?

## 2022-04-13 NOTE — Progress Notes (Signed)
?Progress Note ? ? ?Patient: Isabel Gibbs JRP:[id number] DOB: Oct 29, 1946 DOA: 04/12/2022     1 ?DOS: the patient was seen and examined on 04/13/2022 ?  ?Brief hospital course: ?76 y.o. female with medical history significant of CNS lymphoma, essential hypertension and prediabetes who presented to the ER with fever for 2 days.  Also generalized weakness.  Denied any cough denied any nausea vomiting or diarrhea.  Patient has been on treatment for her lymphoma.  She was recently in rehab good discharged on April 3.  She has been undergoing physical therapy.  She is currently on methotrexate with induction rituximab with the last treatment 5 days ago.  Patient came in on was seen and evaluated.  Initial temperature rectally was 101.  She seems to have met sepsis criteria.  Chest x-ray suggested pneumonia and urinalysis.  Suggested UTI.  Patient is therefore being admitted with sepsis syndrome.  ?  ?4/16 on 1L [place] 92-94%; family came by; patient coughing with fluid intake, will request SLP evaluation. Repeat CXR, check Ddimer given ongoing tachycardia/hypoxia.  ? ?Assessment and Plan: ?HCAP (healthcare-associated pneumonia) ?Continue pulm hygeine ?Wean o2 as tolerated ?OOB as much as tolerated to chair ?Sepsis likely from LRI or UTI, will continue to monitor ?If tachycardia/hypoxia worsen consider CTA Chest,  ?(she has received 3L IVF in the last 24H) ? ? ? ?UTI (urinary tract infection) ?Patient meets sepsis criteria with temperature 101, respiratory rate of 22 on arrival, evidence of UTI.  Also evidence of pneumonia.  With recent hospitalization with appears this could be HCAP. Patient will be admitted and initiated on IV vancomycin and cefepime. ? ?Blood cultures have been obtained.  Urine cultures have been obtained and will follow results. ? ? ?CNS lymphoma ([place]) ?Undergoing chemotherapy.  Patient has corresponding anemia but no thrombocytopenia.  Continue to monitor ?Monitor electrolytes, improve nutrition, PT  eval ? ? ? ? ?  ? ?Subjective:  ? ? ? ? ?Physical Exam: ?Vitals:  ? 04/12/22 2217 04/13/22 0233 04/13/22 0600 04/13/22 1229  ?BP: 130/61 (!) 160/83 130/89 (!) 142/79  ?Pulse: 87 (!) 108 80 (!) 108  ?Resp: 18 18  18   ?Temp: 97.7 ?F (36.5 ?C) 98.4 ?F (36.9 ?C) 98.1 ?F (36.7 ?C) 99.9 ?F (37.7 ?C)  ?TempSrc: Oral Oral Oral Oral  ?SpO2: 95% 90% 94% 93%  ?Weight: 95.3 kg     ?Height: 5' 7"  (1.702 m)     ? ?Chronically ill looking, awake and alert pleasant no distress ?HEENT:.  PERRL, EOMI, no pallor no jaundice no rhinorrhea ?Neck: Supple, no JVD no lymphadenopathy ?Respiratory: Good air entry bilaterally no wheeze no rales or crackles ?Cardiovascular system: Regular rate and rhythm ?Abdomen: Soft, nontender with positive bowel sounds ?Extremities: Trace edema ? ?Data Reviewed: ?  ?Results for orders placed or performed during the hospital encounter of 04/12/22 (from the past 24 hour(s))  ?Lactic acid, plasma     Status: None  ? Collection Time: 04/12/22  6:45 PM  ?Result Value Ref Range  ? Lactic Acid, Venous 1.7 0.5 - 1.9 mmol/L  ?Comprehensive metabolic panel     Status: Abnormal  ? Collection Time: 04/12/22  6:45 PM  ?Result Value Ref Range  ? Sodium 143 135 - 145 mmol/L  ? Potassium 4.3 3.5 - 5.1 mmol/L  ? Chloride 104 98 - 111 mmol/L  ? CO2 34 (H) 22 - 32 mmol/L  ? Glucose, Bld 179 (H) 70 - 99 mg/dL  ? BUN 25 (H) 8 - 23 mg/dL  ?  Creatinine, Ser 1.19 (H) 0.44 - 1.00 mg/dL  ? Calcium 8.0 (L) 8.9 - 10.3 mg/dL  ? Total Protein 5.4 (L) 6.5 - 8.1 g/dL  ? Albumin 2.7 (L) 3.5 - 5.0 g/dL  ? AST 15 15 - 41 U/L  ? ALT 41 0 - 44 U/L  ? Alkaline Phosphatase 74 38 - 126 U/L  ? Total Bilirubin 0.8 0.3 - 1.2 mg/dL  ? GFR, Estimated 47 (L) >60 mL/min  ? Anion gap 5 5 - 15  ?CBC with Differential     Status: Abnormal  ? Collection Time: 04/12/22  6:45 PM  ?Result Value Ref Range  ? WBC 4.8 4.0 - 10.5 K/uL  ? RBC 2.93 (L) 3.87 - 5.11 MIL/uL  ? Hemoglobin 8.7 (L) 12.0 - 15.0 g/dL  ? HCT 27.9 (L) 36.0 - 46.0 %  ? MCV 95.2 80.0 - 100.0  fL  ? MCH 29.7 26.0 - 34.0 pg  ? MCHC 31.2 30.0 - 36.0 g/dL  ? RDW 16.7 (H) 11.5 - 15.5 %  ? Platelets 195 150 - 400 K/uL  ? nRBC 0.0 0.0 - 0.2 %  ? Neutrophils Relative % 50 %  ? Neutro Abs 2.5 1.7 - 7.7 K/uL  ? Lymphocytes Relative 27 %  ? Lymphs Abs 1.3 0.7 - 4.0 K/uL  ? Monocytes Relative 16 %  ? Monocytes Absolute 0.8 0.1 - 1.0 K/uL  ? Eosinophils Relative 5 %  ? Eosinophils Absolute 0.3 0.0 - 0.5 K/uL  ? Basophils Relative 1 %  ? Basophils Absolute 0.0 0.0 - 0.1 K/uL  ? Immature Granulocytes 1 %  ? Abs Immature Granulocytes 0.03 0.00 - 0.07 K/uL  ?Protime-INR     Status: None  ? Collection Time: 04/12/22  6:45 PM  ?Result Value Ref Range  ? Prothrombin Time 14.8 11.4 - 15.2 seconds  ? INR 1.2 0.8 - 1.2  ?APTT     Status: None  ? Collection Time: 04/12/22  6:45 PM  ?Result Value Ref Range  ? aPTT 30 24 - 36 seconds  ?Resp Panel by RT-PCR (Flu A&B, Covid) Nasopharyngeal Swab     Status: None  ? Collection Time: 04/12/22  6:57 PM  ? Specimen: Nasopharyngeal Swab; Nasopharyngeal(NP) swabs in vial transport medium  ?Result Value Ref Range  ? SARS Coronavirus 2 by RT PCR NEGATIVE NEGATIVE  ? Influenza A by PCR NEGATIVE NEGATIVE  ? Influenza B by PCR NEGATIVE NEGATIVE  ?Urinalysis, Routine w reflex microscopic Urine, Clean Catch     Status: Abnormal  ? Collection Time: 04/12/22  8:21 PM  ?Result Value Ref Range  ? Color, Urine YELLOW YELLOW  ? APPearance HAZY (A) CLEAR  ? Specific Gravity, Urine 1.011 1.005 - 1.030  ? pH 8.0 5.0 - 8.0  ? Glucose, UA NEGATIVE NEGATIVE mg/dL  ? Hgb urine dipstick NEGATIVE NEGATIVE  ? Bilirubin Urine NEGATIVE NEGATIVE  ? Ketones, ur NEGATIVE NEGATIVE mg/dL  ? Protein, ur 30 (A) NEGATIVE mg/dL  ? Nitrite POSITIVE (A) NEGATIVE  ? Leukocytes,Ua LARGE (A) NEGATIVE  ? RBC / HPF 0-5 0 - 5 RBC/hpf  ? WBC, UA >50 (H) 0 - 5 WBC/hpf  ? Bacteria, UA FEW (A) NONE SEEN  ?Lactic acid, plasma     Status: None  ? Collection Time: 04/12/22 11:02 PM  ?Result Value Ref Range  ? Lactic Acid, Venous 1.2 0.5  - 1.9 mmol/L  ?CBC     Status: Abnormal  ? Collection Time: 04/12/22 11:02 PM  ?Result Value  Ref Range  ? WBC 4.4 4.0 - 10.5 K/uL  ? RBC 2.91 (L) 3.87 - 5.11 MIL/uL  ? Hemoglobin 8.6 (L) 12.0 - 15.0 g/dL  ? HCT 27.9 (L) 36.0 - 46.0 %  ? MCV 95.9 80.0 - 100.0 fL  ? MCH 29.6 26.0 - 34.0 pg  ? MCHC 30.8 30.0 - 36.0 g/dL  ? RDW 16.7 (H) 11.5 - 15.5 %  ? Platelets 189 150 - 400 K/uL  ? nRBC 0.0 0.0 - 0.2 %  ?Creatinine, serum     Status: Abnormal  ? Collection Time: 04/12/22 11:02 PM  ?Result Value Ref Range  ? Creatinine, Ser 1.05 (H) 0.44 - 1.00 mg/dL  ? GFR, Estimated 54 (L) >60 mL/min  ?MRSA Next Gen by PCR, Nasal     Status: None  ? Collection Time: 04/13/22  2:25 AM  ? Specimen: Nasal Mucosa; Nasal Swab  ?Result Value Ref Range  ? MRSA by PCR Next Gen NOT DETECTED NOT DETECTED  ?Protime-INR     Status: None  ? Collection Time: 04/13/22  4:06 AM  ?Result Value Ref Range  ? Prothrombin Time 14.6 11.4 - 15.2 seconds  ? INR 1.2 0.8 - 1.2  ?Cortisol-am, blood     Status: Abnormal  ? Collection Time: 04/13/22  4:06 AM  ?Result Value Ref Range  ? Cortisol - AM 30.7 (H) 6.7 - 22.6 ug/dL  ?Procalcitonin     Status: None  ? Collection Time: 04/13/22  4:06 AM  ?Result Value Ref Range  ? Procalcitonin <0.10 ng/mL  ?Comprehensive metabolic panel     Status: Abnormal  ? Collection Time: 04/13/22  4:06 AM  ?Result Value Ref Range  ? Sodium 142 135 - 145 mmol/L  ? Potassium 4.6 3.5 - 5.1 mmol/L  ? Chloride 104 98 - 111 mmol/L  ? CO2 30 22 - 32 mmol/L  ? Glucose, Bld 174 (H) 70 - 99 mg/dL  ? BUN 23 8 - 23 mg/dL  ? Creatinine, Ser 1.13 (H) 0.44 - 1.00 mg/dL  ? Calcium 8.3 (L) 8.9 - 10.3 mg/dL  ? Total Protein 5.7 (L) 6.5 - 8.1 g/dL  ? Albumin 2.8 (L) 3.5 - 5.0 g/dL  ? AST 17 15 - 41 U/L  ? ALT 39 0 - 44 U/L  ? Alkaline Phosphatase 77 38 - 126 U/L  ? Total Bilirubin 0.9 0.3 - 1.2 mg/dL  ? GFR, Estimated 50 (L) >60 mL/min  ? Anion gap 8 5 - 15  ?Magnesium     Status: None  ? Collection Time: 04/13/22  4:06 AM  ?Result Value Ref Range   ? Magnesium 2.1 1.7 - 2.4 mg/dL  ?CBC     Status: Abnormal  ? Collection Time: 04/13/22  8:16 AM  ?Result Value Ref Range  ? WBC 5.4 4.0 - 10.5 K/uL  ? RBC 2.81 (L) 3.87 - 5.11 MIL/uL  ? Hemoglobin 8.1 (L) 12.

## 2022-04-14 ENCOUNTER — Inpatient Hospital Stay (HOSPITAL_COMMUNITY): Payer: Medicare Other

## 2022-04-14 ENCOUNTER — Inpatient Hospital Stay: Payer: Medicare Other | Attending: Internal Medicine

## 2022-04-14 DIAGNOSIS — I4891 Unspecified atrial fibrillation: Secondary | ICD-10-CM

## 2022-04-14 DIAGNOSIS — G500 Trigeminal neuralgia: Secondary | ICD-10-CM | POA: Diagnosis not present

## 2022-04-14 DIAGNOSIS — R5383 Other fatigue: Secondary | ICD-10-CM

## 2022-04-14 DIAGNOSIS — R6883 Chills (without fever): Secondary | ICD-10-CM

## 2022-04-14 DIAGNOSIS — L8990 Pressure ulcer of unspecified site, unspecified stage: Secondary | ICD-10-CM | POA: Insufficient documentation

## 2022-04-14 DIAGNOSIS — R410 Disorientation, unspecified: Secondary | ICD-10-CM

## 2022-04-14 DIAGNOSIS — R7303 Prediabetes: Secondary | ICD-10-CM

## 2022-04-14 DIAGNOSIS — A419 Sepsis, unspecified organism: Secondary | ICD-10-CM | POA: Diagnosis not present

## 2022-04-14 DIAGNOSIS — R509 Fever, unspecified: Secondary | ICD-10-CM

## 2022-04-14 DIAGNOSIS — C8589 Other specified types of non-Hodgkin lymphoma, extranodal and solid organ sites: Secondary | ICD-10-CM | POA: Diagnosis not present

## 2022-04-14 LAB — ECHOCARDIOGRAM COMPLETE
AR max vel: 3.23 cm2
AV Area VTI: 3.54 cm2
AV Area mean vel: 3.35 cm2
AV Mean grad: 3 mmHg
AV Peak grad: 6.4 mmHg
Ao pk vel: 1.26 m/s
Area-P 1/2: 3.6 cm2
Calc EF: 60 %
Height: 67 in
S' Lateral: 3.7 cm
Single Plane A2C EF: 64.1 %
Single Plane A4C EF: 57.4 %
Weight: 3361.57 oz

## 2022-04-14 IMAGING — DX DG CHEST 1V PORT
1 series · 1 of 1 positions shown · non-contrast
Comparison: Chest radiograph from one day prior.

CLINICAL DATA: Hypoxia, atrial fibrillation

EXAM:
PORTABLE CHEST 1 VIEW

[chest ap]
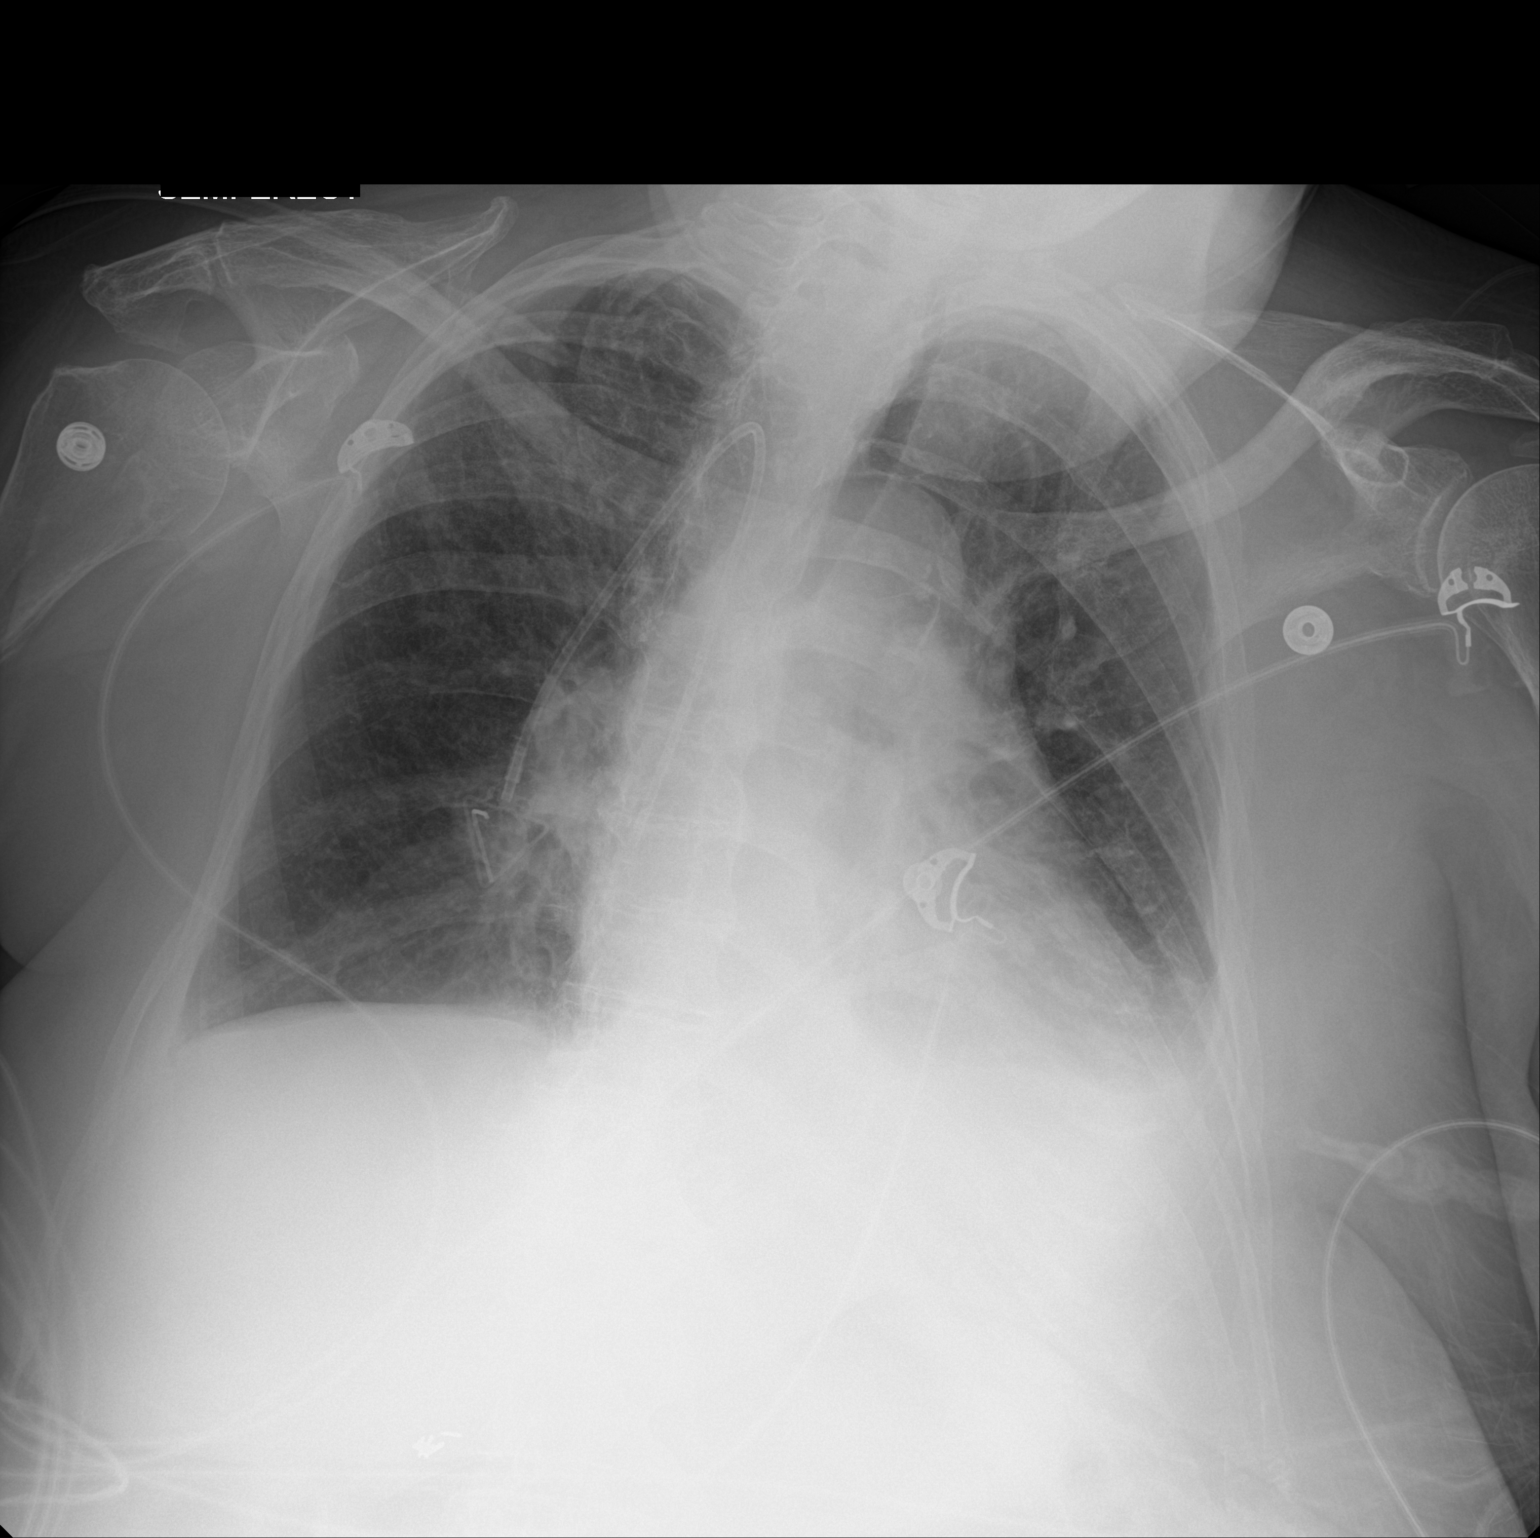

[1 of 1 positions shown; findings below may reference images not displayed]

FINDINGS: Left rotated chest radiograph. Right internal jugular Port-A-Cath
terminates over the cavoatrial junction. Stable cardiomediastinal
silhouette with mild cardiomegaly. No pneumothorax. Small left
pleural effusion is stable. No significant right pleural effusion.
Stable mild left basilar atelectasis. Stable borderline mild
pulmonary edema.
IMPRESSION: 1. Stable borderline mild congestive heart failure.
2. Stable small left pleural effusion and mild left basilar
atelectasis.

## 2022-04-14 MED ORDER — METOPROLOL TARTRATE 25 MG PO TABS
ORAL_TABLET | ORAL | Status: AC
  Filled 2022-04-14: qty 1

## 2022-04-14 MED ORDER — GUAIFENESIN ER 600 MG PO TB12
600.0000 mg | ORAL_TABLET | Freq: Two times a day (BID) | ORAL | Status: DC
  Administered 2022-04-14 – 2022-04-18 (×7): 600 mg via ORAL
  Filled 2022-04-14 (×6): qty 1

## 2022-04-14 MED ORDER — FUROSEMIDE 10 MG/ML IJ SOLN
INTRAMUSCULAR | Status: AC
  Filled 2022-04-14: qty 2

## 2022-04-14 MED ORDER — METOPROLOL TARTRATE 25 MG PO TABS
12.5000 mg | ORAL_TABLET | Freq: Two times a day (BID) | ORAL | Status: DC
  Administered 2022-04-14 (×2): 12.5 mg via ORAL
  Filled 2022-04-14: qty 1

## 2022-04-14 MED ORDER — GUAIFENESIN-DM 100-10 MG/5ML PO SYRP
5.0000 mL | ORAL_SOLUTION | ORAL | Status: DC | PRN
  Administered 2022-04-17: 5 mL via ORAL
  Filled 2022-04-14: qty 10

## 2022-04-14 MED ORDER — GUAIFENESIN ER 600 MG PO TB12
ORAL_TABLET | ORAL | Status: AC
  Filled 2022-04-14: qty 1

## 2022-04-14 MED ORDER — FUROSEMIDE 10 MG/ML IJ SOLN
20.0000 mg | Freq: Once | INTRAMUSCULAR | Status: AC
  Administered 2022-04-14: 20 mg via INTRAVENOUS
  Filled 2022-04-14: qty 2

## 2022-04-14 MED ORDER — FUROSEMIDE 10 MG/ML IJ SOLN
20.0000 mg | Freq: Every day | INTRAMUSCULAR | Status: DC
  Administered 2022-04-15 – 2022-04-16 (×2): 20 mg via INTRAVENOUS
  Filled 2022-04-14 (×2): qty 2

## 2022-04-14 MED ORDER — IPRATROPIUM-ALBUTEROL 0.5-2.5 (3) MG/3ML IN SOLN
3.0000 mL | Freq: Four times a day (QID) | RESPIRATORY_TRACT | Status: DC
  Administered 2022-04-14: 3 mL via RESPIRATORY_TRACT
  Filled 2022-04-14: qty 3

## 2022-04-14 MED ORDER — METOPROLOL TARTRATE 25 MG PO TABS
25.0000 mg | ORAL_TABLET | Freq: Three times a day (TID) | ORAL | Status: DC
  Administered 2022-04-15 (×2): 25 mg via ORAL
  Filled 2022-04-14 (×2): qty 1

## 2022-04-14 MED ORDER — FUROSEMIDE 10 MG/ML IJ SOLN
20.0000 mg | INTRAMUSCULAR | Status: AC
  Administered 2022-04-14: 20 mg via INTRAVENOUS

## 2022-04-14 MED ORDER — POTASSIUM CHLORIDE CRYS ER 20 MEQ PO TBCR: 20.0000 meq | EXTENDED_RELEASE_TABLET | Freq: Every day | ORAL | Status: DC

## 2022-04-14 NOTE — Progress Notes (Addendum)
Made NPO except sips of meds with supervision, due to concern for dysphagia.   ? ?Due to hypoxia will obtain CXR to further assess for any evidence of super imposed pulmonary edema from new afib RVR.  DC IV fluid and give 1 dose IV Lasix 20 mg. ? ?Mild rhonchorous sounds noted on exam.  Peripheral edema. ?

## 2022-04-14 NOTE — Consult Note (Signed)
[facility] ?Neuro-Oncology Consult Note ? ?Patient Care Team: ?Karleen Hampshire., MD as PCP - General (Internal Medicine) ? ?CHIEF COMPLAINTS/PURPOSE OF CONSULTATION:  ?CNS Lymphoma ?Sepsis ? ?HISTORY OF PRESENTING ILLNESS:  ?Isabel Gibbs 76 y.o. female presented with several days of lethargy, confusion, fever, chills.  Found to meet sepsis criteria, still unclear source.  She was able to complete her brain MRI.  Feeling improved closer to baseline, still requiring some oxygen currently. ? ?MEDICAL HISTORY:  ?Past Medical History:  ?Diagnosis Date  ? Hypertension   ? Prediabetes   ? 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 ? ?SURGICAL HISTORY: ?Past Surgical History:  ?Procedure Laterality Date  ? APPLICATION OF CRANIAL NAVIGATION Left 01/02/2022  ? Procedure: APPLICATION OF CRANIAL NAVIGATION;  Surgeon: Judith Part, MD;  Location: [facility];  Service: Neurosurgery;  Laterality: Left;  ? CEREBRAL MICROVASCULAR DECOMPRESSION Right 08/02/2018  ? [place]  ? FRAMELESS  BIOPSY WITH BRAINLAB Left 01/02/2022  ? Procedure: LEFT BRAIN  BIOPSY WITH Lucky Rathke;  Surgeon: Judith Part, MD;  Location: [facility];  Service: Neurosurgery;  Laterality: Left;  ? IR IMAGING GUIDED PORT INSERTION  03/04/2022  ? RADIOLOGY WITH ANESTHESIA N/A 01/01/2022  ? Procedure: MRI of Brain with and without contrast;  Surgeon: Radiologist, Medication, MD;  Location: [facility];  Service: Radiology;  Laterality: N/A;  ? ? ?SOCIAL HISTORY: ?Social History  ? ?Socioeconomic History  ? Marital status: Widowed  ?  Spouse name: Not on file  ? Number of children: Not on file  ? Years of education: Not on file  ? Highest education level: Not on file  ?Occupational History  ? Not on file  ?Tobacco Use  ? Smoking status: Former  ?  Types: Cigarettes  ?  Quit date: 2000  ?  Years since quitting: 23.3  ? Smokeless tobacco: Never  ?Vaping Use  ? Vaping Use: Never used  ?Substance and Sexual Activity  ? Alcohol use: Never  ? Drug use: Never  ? Sexual  activity: Not on file  ?Other Topics Concern  ? Not on file  ?Social History Narrative  ? Not on file  ? ?Social Determinants of Health  ? ?Financial Resource Strain: Not on file  ?Food Insecurity: Not on file  ?Transportation Needs: Not on file  ?Physical Activity: Not on file  ?Stress: Not on file  ?Social Connections: Not on file  ?Intimate Partner Violence: Not on file  ? ? ?FAMILY HISTORY: ?Family History  ?Problem Relation Age of Onset  ? Heart failure Brother   ? Diabetes Brother   ? Leukemia Brother   ? ? ?ALLERGIES:  is allergic to codeine, lisinopril, and hydrocodone-acetaminophen. ? ?MEDICATIONS:  ?Current Facility-Administered Medications  ?Medication Dose Route Frequency Provider Last Rate Last Admin  ? acetaminophen (TYLENOL) tablet 650 mg  650 mg Oral Q6H PRN Elwyn Reach, MD   650 mg at 04/14/22 0953  ? Or  ? acetaminophen (TYLENOL) suppository 650 mg  650 mg Rectal Q6H PRN Elwyn Reach, MD      ? ceFEPIme (MAXIPIME) 2 g in sodium chloride 0.9 % 100 mL IVPB  2 g Intravenous Q12H Gala Romney L, MD 200 mL/hr at 04/14/22 0535 2 g at 04/14/22 0535  ? Chlorhexidine Gluconate Cloth 2 % PADS 6 each  6 each Topical Daily Vanna Scotland, MD   6 each at 04/13/22 1614  ? enoxaparin (LOVENOX) injection 40 mg  40 mg Subcutaneous Q24H Garba, Mohammad L,  MD   40 mg at 04/14/22 0950  ? gabapentin (NEURONTIN) capsule 900 mg  900 mg Oral BID Vanna Scotland, MD   900 mg at 04/13/22 1522  ? irbesartan (AVAPRO) tablet 150 mg  150 mg Oral QHS Vanna Scotland, MD      ? lactated ringers infusion   Intravenous Continuous Elwyn Reach, MD 125 mL/hr at 04/14/22 0647 New Bag at 04/14/22 0647  ? metoprolol tartrate (LOPRESSOR) tablet 12.5 mg  12.5 mg Oral BID Irene Pap N, DO   12.5 mg at 04/14/22 1100  ? polyvinyl alcohol (LIQUIFILM TEARS) 1.4 % ophthalmic solution 1 drop  1 drop Both Eyes PRN Alanda Amass, Aseem, MD      ? sodium chloride flush (NS) 0.9 % injection 10-40 mL  10-40 mL Intracatheter PRN Vanna Scotland, MD       ? ? ?REVIEW OF SYSTEMS:   ?Constitutional: +fever ?Eyes: Denies blurriness of vision ?Ears, nose, mouth, throat, and face: Denies mucositis or sore throat ?Respiratory: Denies cough, dyspnea or wheezes ?Cardiovascular: Denies palpitation, chest discomfort or lower extremity swelling ?Gastrointestinal:  Denies nausea, constipation, diarrhea ?GU: Denies dysuria or incontinence ?Skin: Denies abnormal skin rashes ?Neurological: Per HPI ?Musculoskeletal: Denies joint pain, back or neck discomfort. No decrease in ROM ?Behavioral/Psych: Denies anxiety, disturbance in thought content, and mood instability ? ? ?PHYSICAL EXAMINATION: ?Vitals:  ? 04/14/22 0425 04/14/22 1030  ?BP: 120/76 122/77  ?Pulse: 79 (!) 105  ?Resp: 20 (!) 27  ?Temp: 98.4 ?F (36.9 ?C) 98.4 ?F (36.9 ?C)  ?SpO2: 94% 96%  ? ?KPS: 60. ?General: Alert, cooperative, pleasant, in no acute distress ?Head: Craniotomy scar noted, dry and intact. ?EENT: No conjunctival injection or scleral icterus. Oral mucosa moist ?Lungs: Resp effort normal ?Cardiac: Regular rate and rhythm ?Abdomen: Soft, non-distended abdomen ?Skin: No rashes cyanosis or petechiae. ?Extremities: No clubbing or edema ? ?NEUROLOGIC EXAM: ?Mental Status: Awake, alert. Oriented to self and environment. Language is impaired with regards to fluency, comprehension intact to multi step.  ?Cranial Nerves: Visual acuity is grossly normal. Visual fields are full. Extra-ocular movements intact. No ptosis. Face is symmetric ?Motor: Tone and bulk are normal. Power is noted 4/5 in right arm and leg. Reflexes are symmetric, no pathologic reflexes present.  ?Sensory: Intact to light touch ?Gait: Deferred ? ? ?LABORATORY DATA:  ?I have reviewed the data as listed ?Lab Results  ?Component Value Date  ? WBC 5.4 04/13/2022  ? HGB 8.1 (L) 04/13/2022  ? HCT 26.7 (L) 04/13/2022  ? MCV 95.0 04/13/2022  ? PLT 205 04/13/2022  ? ?Recent Labs  ?  04/07/22 ?9485 04/12/22 ?1845 04/12/22 ?2302 04/13/22 ?0406 04/13/22 ?2242   ?NA 141 143  --  142 142  ?K 3.4* 4.3  --  4.6 3.5  ?CL 92* 104  --  104 106  ?CO2 43* 34*  --  30 31  ?GLUCOSE 143* 179*  --  174* 124*  ?BUN 27* 25*  --  23 23  ?CREATININE 1.35* 1.19* 1.05* 1.13* 1.25*  ?CALCIUM 8.2* 8.0*  --  8.3* 8.1*  ?GFRNONAA 40* 47* 54* 50* 44*  ?PROT 4.7* 5.4*  --  5.7*  --   ?ALBUMIN 2.4* 2.7*  --  2.8* 2.5*  ?AST 38 15  --  17  --   ?ALT 199* 41  --  39  --   ?ALKPHOS 59 74  --  77  --   ?BILITOT 0.9 0.8  --  0.9  --   ? ? ?  RADIOGRAPHIC STUDIES: ?I have personally reviewed the radiological images as listed and agreed with the findings in the report. ?MR BRAIN W WO CONTRAST ? ?Result Date: 04/14/2022 ?CLINICAL DATA:  76 year old female with a history of high-grade B-cell CNS lymphoma. Restaging. EXAM: MRI HEAD WITHOUT AND WITH CONTRAST TECHNIQUE: Multiplanar, multiecho pulse sequences of the brain and surrounding structures were obtained without and with intravenous contrast. CONTRAST:  47m GADAVIST GADOBUTROL 1 MMOL/ML IV SOLN COMPARISON:  Brain MRI 03/05/2022 and earlier. FINDINGS: Brain: Substantial regression of tumor since last month. Largely resolved anterior cerebral mass effect. Slightly ex vacuo appearance of both frontal horns now. Residual irregular, masslike T2 and FLAIR hyperintensity with abnormal enhancement located adjacent to the both frontal horns, greater on the left, crossing the corpus callosum, and tracking down into the left inferior frontal gyrus (series 15, image 27 and series 20, images 68-71). Hemosiderin and susceptibility within the lesion now is more concentrated (series 13, image 38). Continued evidence of hypercellularity in the genu of the corpus callosum on DWI. The largest, confluent enhancing tumor component in the left inferior frontal gyrus now encompasses 37 by 21 x 18 mm (AP by transverse by CC) versus 5 cm or more month. There remains patchy abnormal basal ganglia enhancement on the right (series 20, image 66) but that 2 has regressed. No areas of  worsening signal abnormality, enhancement, or mass effect. No superimposed restricted diffusion suggestive of acute infarction. No midline shift, ventriculomegaly, extra-axial collection or acute intracranial

## 2022-04-14 NOTE — TOC Progression Note (Signed)
Transition of Care (TOC) - Progression Note  ? ? ?Patient Details  ?Name: Isabel Gibbs ?MRN: [id number] ?Date of Birth: 20-Dec-1946 ? ?Transition of Care (TOC) CM/SW Contact  ?Purcell Mouton, RN ?Phone Number: ?04/14/2022, 12:18 PM ? ?Clinical Narrative:    ?Spoke with pt's son Merry Proud concerning disposition at discharge. Merry Proud would like if she qualifies to go to SNF/rehab to get her strength back before coming home.  ?This is pt's second admission.  ? ? ?Expected Discharge Plan: [place] ?Barriers to Discharge: No Barriers Identified ? ?Expected Discharge Plan and Services ?Expected Discharge Plan: [place] ?  ?  ?Post Acute Care Choice: Home Health, [place] ?Living arrangements for the past 2 months: [place] ?                ?  ?  ?  ?  ?  ?  ?  ?  ?  ?  ? ? ?Social Determinants of Health (SDOH) Interventions ?  ? ?Readmission Risk Interventions ? ?  03/11/2022  ?  1:36 PM  ?Readmission Risk Prevention Plan  ?Transportation Screening Complete  ?PCP or Specialist Appt within 5-7 Days Complete  ?Home Care Screening Complete  ?Medication Review (RN CM) Complete  ? ? ?

## 2022-04-14 NOTE — Significant Event (Addendum)
Rapid Response Event Note  ? ?Reason for Call : ?Notified by bedside RN at 1800, [place], during rounding in regards to patient having increased respirations and slight increase in work of breathing. Upon arrival, patient was alert and oriented x3. Patient O2 Saturations 89-90% on 4Liters [place]. Patient also going in and out of atrial fibulation with moderate response. Patient's son at bedside.  ? ?Initial Focused Assessment:  ?Neuro: Alert and oriented to self, place, situation, disoriented to time. Able to follow commands.  ?Cardiac: Per telemetry monitor patient changing from sinus tachycardia with PACs into atrial fib with moderate response- HR 110s-120s, S1 and S2 heard upon ausculation but irregular, BP WNL see vital signs  ?Bilateral pitting edema noted: 2-3+ ?Pulmonary: RR 20-30, O2 Sats 89-90% on 4L increased to 5L patient's O2 Sats responded to 97 to 98%. Breath sounds clear in upper lung fields but diminished in lower lung fields. Patient coughing clear thin sputum, strong cough noted, patient able to suction with [place] at beside.  ?  ? ?Interventions:  ?Keep NPO due to possible aspiration-patient may need barium swallow evaluation  ?MD [place] Crane notified by beside RN to request CXR  ?-MD [place] Crane on the way to beside to evaluate patient  ? ?Plan of Care:  ?Continue to monitor patient on 4th floor progressive, if patient has a change in mental status or hemodynamic status please notify Rapid Response.  ? ? ?Event Summary:  ? ?MD Notified: MD Notified by bedside RN  ?Call Time: 1800 ?Arrival Time: 25 ?End Time: 4431 ? ?Laurence Slate, RN ?

## 2022-04-14 NOTE — Progress Notes (Signed)
Per central monitoring, patient converted to Afib at 0227 this morning, and converted back to NSR at 1150. ? ?Angie Fava, RN  ?

## 2022-04-14 NOTE — Progress Notes (Signed)
?Progress Note ? ? ?Patient: Isabel Gibbs NFA:[id number] DOB: 1946/02/12 DOA: 04/12/2022     2 ?DOS: the patient was seen and examined on 04/14/2022 ?  ?Brief hospital course: ?76 y.o. female with medical history significant of CNS lymphoma, essential hypertension and prediabetes who presented to the ER with fever for 2 days.  Also generalized weakness.  Denied any cough denied any nausea vomiting or diarrhea.  Patient has been on treatment for her lymphoma.  She was recently in rehab good discharged on April 3.  She has been undergoing physical therapy.  She is currently on methotrexate with induction rituximab with the last treatment 5 days ago.  Patient came in on was seen and evaluated.  Initial temperature rectally was 101.  She seems to have met sepsis criteria.  Chest x-ray suggested pneumonia and urinalysis.  Suggested UTI.  Patient is therefore being admitted with sepsis syndrome.  ?  ?4/16 on 1L [place] 92-94%; family came by; patient coughing with fluid intake, will request SLP evaluation. Repeat CXR, check Ddimer given ongoing tachycardia/hypoxia.  ? ?04/14/2022: Patient went briefly into A-fib with RVR.  Started p.o. Lopressor 12.5 mg twice daily.  Cardiology Dr. Einar Gip consulted.  Appreciate assistance. ? ?Assessment and Plan: ?HCAP (healthcare-associated pneumonia) ?Continue pulm hygeine ?Wean o2 as tolerated ?OOB as much as tolerated to chair ?Sepsis likely from LRI or UTI, will continue to monitor ?If tachycardia/hypoxia worsen consider CTA Chest,  ?(she has received 3L IVF in the last 24H) ? ? ? ?UTI (urinary tract infection) ?Patient meets sepsis criteria with temperature 101, respiratory rate of 22 on arrival, evidence of UTI.  Also evidence of pneumonia.  With recent hospitalization with appears this could be HCAP. Patient will be admitted and initiated on IV vancomycin and cefepime. ? ?Blood cultures have been obtained.  Urine cultures have been obtained and will follow results. ? ? ?CNS lymphoma  ([place]) ?Undergoing chemotherapy.  Patient has corresponding anemia but no thrombocytopenia.  Continue to monitor ?Monitor electrolytes, improve nutrition, PT eval ? ? ?New A-fib with RVR, back to sinus rhythm ?Started p.o. Lopressor 12.5 mg twice daily ?Obtain 2D echo ?Continue to monitor on telemetry ?CHA2DS2-VASc of 4, back in sinus rhythm ? ? ?  ? ?Subjective:  ?Reports having a mild to moderate headache this morning. ? ? ? ?Physical Exam: ?Vitals:  ? 04/14/22 1230 04/14/22 1356 04/14/22 1405 04/14/22 1513  ?BP:  (!) 99/52 (!) 112/55 (!) 114/55  ?Pulse: 86 71  83  ?Resp: 19 (!) 21  (!) 22  ?Temp: 97.9 ?F (36.6 ?C) 97.6 ?F (36.4 ?C)  98.1 ?F (36.7 ?C)  ?TempSrc: Oral Oral  Oral  ?SpO2: 95% 99%  99%  ?Weight:      ?Height:      ? ?Chronically ill looking, awake and alert pleasant no distress ?HEENT:.  PERRL, EOMI, no pallor no jaundice no rhinorrhea ?Neck: Supple, no JVD no lymphadenopathy ?Respiratory: Good air entry bilaterally no wheeze no rales or crackles ?Cardiovascular system: Regular rate and rhythm ?Abdomen: Soft, nontender with positive bowel sounds ?Extremities: Trace edema ? ?Data Reviewed: ?  ?Results for orders placed or performed during the hospital encounter of 04/12/22 (from the past 24 hour(s))  ?Renal function panel     Status: Abnormal  ? Collection Time: 04/13/22 10:42 PM  ?Result Value Ref Range  ? Sodium 142 135 - 145 mmol/L  ? Potassium 3.5 3.5 - 5.1 mmol/L  ? Chloride 106 98 - 111 mmol/L  ? CO2 31 22 - 32  mmol/L  ? Glucose, Bld 124 (H) 70 - 99 mg/dL  ? BUN 23 8 - 23 mg/dL  ? Creatinine, Ser 1.25 (H) 0.44 - 1.00 mg/dL  ? Calcium 8.1 (L) 8.9 - 10.3 mg/dL  ? Phosphorus 3.5 2.5 - 4.6 mg/dL  ? Albumin 2.5 (L) 3.5 - 5.0 g/dL  ? GFR, Estimated 44 (L) >60 mL/min  ? Anion gap 5 5 - 15  ?Ammonia     Status: None  ? Collection Time: 04/13/22 10:42 PM  ?Result Value Ref Range  ? Ammonia 16 9 - 35 umol/L  ? ? ?Disposition: ?Status is: Inpatient ?Remains inpatient appropriate because: hypoxia, sepsis,  UTI ? Planned Discharge Destination: Home ? ? ? ?Time spent:  ?35 minutes ? ?Author: ?Kayleen Memos, DO ?04/14/2022 5:09 PM ? ?For on call review [email or web address].  ?

## 2022-04-14 NOTE — Evaluation (Signed)
Clinical/Bedside Swallow Evaluation ?Patient Details  ?Name: Isabel Gibbs ?MRN: [id number] ?Date of Birth: [birth year] ? ?Today's Date: 04/14/2022 ?Time: SLP Start Time (ACUTE ONLY): [id number] SLP Stop Time (ACUTE ONLY): 6962 ?SLP Time Calculation (min) (ACUTE ONLY): 20 min ? ?Past Medical History:  ?Past Medical History:  ?Diagnosis Date  ? Hypertension   ? Prediabetes   ? 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 ?Past Surgical History:  ?Past Surgical History:  ?Procedure Laterality Date  ? APPLICATION OF CRANIAL NAVIGATION Left 01/02/2022  ? Procedure: APPLICATION OF CRANIAL NAVIGATION;  Surgeon: Judith Part, MD;  Location: [facility];  Service: Neurosurgery;  Laterality: Left;  ? CEREBRAL MICROVASCULAR DECOMPRESSION Right 08/02/2018  ? [place]  ? FRAMELESS  BIOPSY WITH BRAINLAB Left 01/02/2022  ? Procedure: LEFT BRAIN  BIOPSY WITH Lucky Rathke;  Surgeon: Judith Part, MD;  Location: [facility];  Service: Neurosurgery;  Laterality: Left;  ? IR IMAGING GUIDED PORT INSERTION  03/04/2022  ? RADIOLOGY WITH ANESTHESIA N/A 01/01/2022  ? Procedure: MRI of Brain with and without contrast;  Surgeon: Radiologist, Medication, MD;  Location: [facility];  Service: Radiology;  Laterality: N/A;  ? ?HPI:  ?Patient is a 76 y.o. female with PMH: HTN, prediabetes, trigeminal neuralgia of right side of face, who presented to the hospital with several days lethargy, confusion, fever, chills. She was found to meet criteria for sepsis with unclear source. 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 CXR showed Increased density in the right upper and left lower lung fields may  suggest atelectasis/pneumonia.  ?  ?Assessment / Plan / Recommendation  ?Clinical Impression ? Patient is not currently presenting with clinical s/s of dysphagia as per this bedside/clinical swallow evaluation. She does not have her top dentures here in hospital and she  reports she does use them for chewing foods that are not soft. SLP did not observe any overt s/s aspiration or penetration with straw sips thin liquids or bites of puree (applesauce). Patient denies any h/o dysphagia. SLP is recommending Dys 3 solids, thin liquids but if patient brings her top dentures, she could advance to regular solids without further SLP intervention. ?SLP Visit Diagnosis: Dysphagia, unspecified (R13.10) ?   ?Aspiration Risk ? No limitations  ?  ?Diet Recommendation Dysphagia 3 (Mech soft);Thin liquid  ? ?Liquid Administration via: Cup;Straw ?Medication Administration: Whole meds with liquid ?Supervision: Patient able to self feed ?Compensations: Small sips/bites;Slow rate ?Postural Changes: Seated upright at 90 degrees  ?  ?Other  Recommendations Oral Care Recommendations: Oral care BID   ? ?Recommendations for follow up therapy are one component of a multi-disciplinary discharge planning process, led by the attending physician.  Recommendations may be updated based on patient status, additional functional criteria and insurance authorization. ? ?Follow up Recommendations No SLP follow up  ? ? ?  ?Assistance Recommended at Discharge None  ?Functional Status Assessment Patient has had a recent decline in their functional status and demonstrates the ability to make significant improvements in function in a reasonable and predictable amount of time.  ?Frequency and Duration    ? N/A ?  ?   ? ?Prognosis   N/A ? ?  ? ?Swallow Study   ?General Date of Onset: 04/14/22 ?HPI: Patient is a 76 y.o. female with PMH: HTN, prediabetes, trigeminal neuralgia of right side of face, who presented to the hospital with several days lethargy, confusion, fever, chills. She was found  to meet criteria for sepsis with unclear source. 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 CXR showed Increased density in the  right upper and left lower lung fields may  suggest atelectasis/pneumonia. ?Type of Study: Bedside Swallow Evaluation ?Previous Swallow Assessment: none found ?Diet Prior to this Study: NPO ?Temperature Spikes Noted: No ?Respiratory Status: Nasal cannula ?History of Recent Intubation: No ?Behavior/Cognition: Alert;Cooperative;Pleasant mood ?Oral Cavity Assessment: Dry ?Oral Care Completed by SLP: Yes ?Oral Cavity - Dentition: Dentures, not available;Missing dentition ?Vision: Functional for self-feeding ?Self-Feeding Abilities: Able to feed self ?Patient Positioning: Upright in bed ?Baseline Vocal Quality: Normal ?Volitional Cough: Strong  ?  ?Oral/Motor/Sensory Function Overall Oral Motor/Sensory Function: Within functional limits   ?Ice Chips     ?Thin Liquid Thin Liquid: Within functional limits ?Presentation: Straw;Self Fed  ?  ?Nectar Thick     ?Honey Thick     ?Puree Puree: Within functional limits ?Presentation: Self Fed   ?Solid ? ? ?  Solid: Not tested  ? ?  ? ?Sonia Baller, MA, CCC-SLP ?Speech Therapy ? ? ? ? ?

## 2022-04-14 NOTE — Progress Notes (Signed)
?   04/14/22 1030  ?Assess: MEWS Score  ?Temp 98.4 ?F (36.9 ?C)  ?BP 122/77  ?Pulse Rate (!) 105  ?ECG Heart Rate (!) 123  ?Resp (!) 27  ?Level of Consciousness Alert  ?SpO2 96 %  ?O2 Device Nasal Cannula  ?O2 Flow Rate (L/min) 3 L/min  ?Assess: MEWS Score  ?MEWS Temp 0  ?MEWS Systolic 0  ?MEWS Pulse 2  ?MEWS RR 2  ?MEWS LOC 0  ?MEWS Score 4  ?MEWS Score Color Red  ?Assess: if the MEWS score is Yellow or Red  ?Were vital signs taken at a resting state? Yes  ?Focused Assessment Change from prior assessment (see assessment flowsheet)  ?Does the patient meet 2 or more of the SIRS criteria? Yes  ?Does the patient have a confirmed or suspected source of infection? Yes  ?Provider and Rapid Response Notified?  ?(provider notified)  ?MEWS guidelines implemented *See Row Information* Yes  ?Treat  ?MEWS Interventions Escalated (See documentation below)  ?Pain Scale 0-10  ?Pain Score 5  ?Pain Type Acute pain  ?Pain Location Head  ?Pain Descriptors / Indicators Headache  ?Take Vital Signs  ?Increase Vital Sign Frequency  Red: Q 1hr X 4 then Q 4hr X 4, if remains red, continue Q 4hrs  ?Escalate  ?MEWS: Escalate Red: discuss with charge nurse/RN and provider, consider discussing with RRT  ?Notify: Charge Nurse/RN  ?Name of Charge Nurse/RN Notified Rise Paganini, RN  ?Date Charge Nurse/RN Notified 04/14/22  ?Time Charge Nurse/RN Notified 1034  ?Notify: Provider  ?Provider Name/Title Irene Pap, DO  ?Date Provider Notified 04/14/22  ?Time Provider Notified 1034  ?Notification Type Page  ?Notification Reason Change in status  ?Assess: SIRS CRITERIA  ?SIRS Temperature  0  ?SIRS Pulse 1  ?SIRS Respirations  1  ?SIRS WBC 0  ?SIRS Score Sum  2  ? ?Patient denies chest pain, palpitations.  Patient c/o headache and feeling "weak and tired." ? ?Angie Fava, RN  ?

## 2022-04-14 NOTE — H&P (Addendum)
CARDIOLOGY CONSULT NOTE  ?Patient ID: ?Isabel Gibbs ?MRN: [id number] ?DOB/AGE: [birth year] 76 y.o. ? ?Admit date: 04/12/2022 ?Referring Physician  Irene Pap, MD ?Primary Physician:  Karleen Hampshire., MD ?Reason for Consultation  A. Fib ? ?Patient ID: Isabel Gibbs, female    DOB: 1946/09/24, 76 y.o.   MRN: [id number] ? ?Chief Complaint  ?Patient presents with  ? Fever ?Shortness of breath  ? ?HPI:   ? ?Isabel Gibbs  is a 76 y.o. Caucasian female patient with active CNS lymphoma, primary hypertension, prediabetes mellitus, admitted to the hospital with generalized weakness.  Presently on active treatment with methotrexate and rituximab. ? ?She is now being admitted with sepsis, pneumonia and UTI.  Patient developed transient atrial fibrillation with RVR.  I was consulted to recommend further management and recommendation regarding anticoagulation. ? ?Patient developed A-fib with RVR around 2 AM this morning, was started on metoprolol tartrate 12.5 mg twice daily, spontaneously converted to sinus rhythm. ? ?Patient states she is mildly short of breath.  She was appropriate to me and answering the questions but sometimes hard to understand. ? ?Past Medical History:  ?Diagnosis Date  ? Hypertension   ? Prediabetes   ? 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 ?Past Surgical History:  ?Procedure Laterality Date  ? APPLICATION OF CRANIAL NAVIGATION Left 01/02/2022  ? Procedure: APPLICATION OF CRANIAL NAVIGATION;  Surgeon: Judith Part, MD;  Location: [facility];  Service: Neurosurgery;  Laterality: Left;  ? CEREBRAL MICROVASCULAR DECOMPRESSION Right 08/02/2018  ? [place]  ? FRAMELESS  BIOPSY WITH BRAINLAB Left 01/02/2022  ? Procedure: LEFT BRAIN  BIOPSY WITH Lucky Rathke;  Surgeon: Judith Part, MD;  Location: [facility];  Service: Neurosurgery;  Laterality: Left;  ? IR IMAGING GUIDED PORT INSERTION  03/04/2022  ? RADIOLOGY WITH ANESTHESIA N/A 01/01/2022  ? Procedure: MRI of Brain with and without contrast;  Surgeon: Radiologist,  Medication, MD;  Location: [facility];  Service: Radiology;  Laterality: N/A;  ? ?Social History  ? ?Tobacco Use  ? Smoking status: Former  ?  Types: Cigarettes  ?  Quit date: 2000  ?  Years since quitting: 23.3  ? Smokeless tobacco: Never  ?Substance Use Topics  ? Alcohol use: Never  ?  ?Family History  ?Problem Relation Age of Onset  ? Heart failure Brother   ? Diabetes Brother   ? Leukemia Brother   ?  ?Marital Status: Widowed  ?ROS  ?Review of Systems  ?Constitutional: Positive for decreased appetite and fever.  ?Cardiovascular:  Positive for dyspnea on exertion and leg swelling. Negative for chest pain.  ?Objective  ? ? ?  04/14/2022  ?  3:13 PM 04/14/2022  ?  2:05 PM 04/14/2022  ?  1:56 PM  ?Vitals with BMI  ?Systolic 782 956 99  ?Diastolic 55 55 52  ?Pulse 83  71  ?  ?Blood pressure (!) 114/55, pulse 83, temperature 98.1 ?F (36.7 ?C), temperature source Oral, resp. rate (!) 22, height '5\' 7"'$  (1.702 m), weight 95.3 kg, SpO2 99 %.  ? ?Physical Exam ?Neck:  ?   Vascular: JVD present.  ?Cardiovascular:  ?   Rate and Rhythm: Tachycardia present. Rhythm irregular.  ?   Pulses: Intact distal pulses.  ?   Heart sounds: Normal heart sounds. No murmur heard. ?  No gallop.  ?Pulmonary:  ?   Effort: Pulmonary effort is normal.  ?   Breath sounds: Examination of the right-lower field reveals rales. Examination of the left-lower field reveals  rales. Rhonchi and rales present.  ?Abdominal:  ?   General: Bowel sounds are normal.  ?   Palpations: Abdomen is soft.  ?Musculoskeletal:  ?   Right lower leg: Edema (2+ pitting below knee) present.  ?   Left lower leg: Edema (2+ pitting below knee) present.  ? ?Laboratory examination:  ? ?Recent Labs  ?  04/12/22 ?1845 04/12/22 ?2302 04/13/22 ?0406 04/13/22 ?2242  ?NA 143  --  142 142  ?K 4.3  --  4.6 3.5  ?CL 104  --  104 106  ?CO2 34*  --  30 31  ?GLUCOSE 179*  --  174* 124*  ?BUN 25*  --  23 23  ?CREATININE 1.19* 1.05* 1.13* 1.25*  ?CALCIUM 8.0*  --  8.3* 8.1*  ?GFRNONAA 47* 54* 50* 44*   ? ?estimated creatinine clearance is 44 mL/min (A) (by C-G formula based on SCr of 1.25 mg/dL (H)).  ? ?  Latest Ref Rng & Units 04/13/2022  ? 10:42 PM 04/13/2022  ?  4:06 AM 04/12/2022  ? 11:02 PM  ?CMP  ?Glucose 70 - 99 mg/dL 124   174     ?BUN 8 - 23 mg/dL 23   23     ?Creatinine 0.44 - 1.00 mg/dL 1.25   1.13   1.05    ?Sodium 135 - 145 mmol/L 142   142     ?Potassium 3.5 - 5.1 mmol/L 3.5   4.6     ?Chloride 98 - 111 mmol/L 106   104     ?CO2 22 - 32 mmol/L 31   30     ?Calcium 8.9 - 10.3 mg/dL 8.1   8.3     ?Total Protein 6.5 - 8.1 g/dL  5.7     ?Total Bilirubin 0.3 - 1.2 mg/dL  0.9     ?Alkaline Phos 38 - 126 U/L  77     ?AST 15 - 41 U/L  17     ?ALT 0 - 44 U/L  39     ? ? ?  Latest Ref Rng & Units 04/13/2022  ?  8:16 AM 04/12/2022  ? 11:02 PM 04/12/2022  ?  6:45 PM  ?CBC  ?WBC 4.0 - 10.5 K/uL 5.4   4.4   4.8    ?Hemoglobin 12.0 - 15.0 g/dL 8.1   8.6   8.7    ?Hematocrit 36.0 - 46.0 % 26.7   27.9   27.9    ?Platelets 150 - 400 K/uL 205   189   195    ? ?Lipid Panel ?No results for input(s): CHOL, TRIG, LDLCALC, VLDL, HDL, CHOLHDL, LDLDIRECT in the last 8760 hours.  ?HEMOGLOBIN A1C ?Lab Results  ?Component Value Date  ? HGBA1C 6.0 (H) 03/05/2022  ? MPG 125.5 03/05/2022  ? ?TSH ?Recent Labs  ?  12/31/21 ?0726  ?TSH 0.871  ? ?BNP (last 3 results) ?No results for input(s): BNP in the last 8760 hours. ?Cardiac Panel (last 3 results) ?No results for input(s): CKTOTAL, CKMB, TROPONINIHS, RELINDX in the last 72 hours. ? ?Medications and allergies  ? ?Allergies  ?Allergen Reactions  ? Codeine Other (See Comments)  ?  Severe GI upset ?  ? Lisinopril Cough  ?   ?  ? Hydrocodone-Acetaminophen Nausea Only  ?  ? ?Current Meds  ?Medication Sig  ? acetaminophen (TYLENOL) 500 MG tablet Take 500-1,000 mg by mouth 2 (two) times daily as needed for mild pain.  ? alendronate (FOSAMAX) 70 MG tablet  Take 70 mg by mouth every Friday.  ? CRANBERRY PO Take 1 tablet by mouth daily.  ? gabapentin (NEURONTIN) 300 MG capsule Take 900 mg by mouth  See admin instructions. Take 3 capsules (900 mg) by mouth twice daily - afternoon and bedtime  ? irbesartan (AVAPRO) 150 MG tablet Take 150 mg by mouth at bedtime.  ? Nystatin (GERHARDT'S BUTT CREAM) CREA Apply 1 application topically 2 (two) times daily.  ? SYSTANE ULTRA PF 0.4-0.3 % SOLN Place 1 drop into both eyes 3 (three) times daily.  ? Vitamin D, Ergocalciferol, (DRISDOL) 1.25 MG (50000 UNIT) CAPS capsule Take 50,000 Units by mouth every Wednesday.  ? ? ?Scheduled Meds: ? Chlorhexidine Gluconate Cloth  6 each Topical Daily  ? enoxaparin (LOVENOX) injection  40 mg Subcutaneous Q24H  ? furosemide  20 mg Intravenous STAT  ? gabapentin  900 mg Oral BID  ? guaiFENesin  600 mg Oral BID  ? irbesartan  150 mg Oral QHS  ? metoprolol tartrate  12.5 mg Oral BID  ? ?Continuous Infusions: ? ceFEPime (MAXIPIME) IV 2 g (04/14/22 0535)  ? lactated ringers 125 mL/hr at 04/14/22 1512  ? ?PRN Meds:.acetaminophen **OR** acetaminophen, guaiFENesin-dextromethorphan, polyvinyl alcohol, sodium chloride flush  ? ?I/O last 3 completed shifts: ?In: 6513.7 [I.V.:3782.7; IV SFKCLEXNT:7001] ?Out: 2680 [Urine:2680] ?Total I/O ?In: 240 [P.O.:240] ?Out: -  ?  ? ?Radiology:  ?Chest x-ray AP view 04/13/2022, compared to 04/12/2022: ?Increased density in the right upper and left lower lung fields may suggest atelectasis/pneumonia. Small left pleural effusion is seen. There is possible minimal right pleural effusion ? ? ?Cardiac Studies:  ? ?Echocardiogram 04/14/2022:  ? 1. Left ventricular ejection fraction, by estimation, is 50%. The left ventricle has mildly decreased function. The left ventricle demonstrates global hypokinesis. There is mild left ventricular hypertrophy. Left ventricular diastolic parameters are consistent with Grade I diastolic dysfunction (impaired relaxation). ? 2. Right ventricular systolic function is normal. The right ventricular size is normal. Tricuspid regurgitation signal is inadequate for assessing PA pressure. ? 3.  Left atrial size was mildly dilated. ? 4. The mitral valve is normal in structure. No evidence of mitral valve regurgitation. No evidence of mitral stenosis. ? 5. The aortic valve is tricuspid. Aortic valve r

## 2022-04-14 NOTE — NC FL2 (Signed)
?[place] MEDICAID FL2 LEVEL OF CARE SCREENING TOOL  ?  ? ?IDENTIFICATION  ?Patient Name: ?Isabel Gibbs Birthdate: 1946/06/10 Sex: female Admission Date (Current Location): ?04/12/2022  ?[place] and [place] Number: ? [place] ?  Facility and Address:  ?[facility],  [address], [place] ?     Provider Number: ?[id number]  ?Attending Physician Name and Address:  ?Kayleen Memos, DO ? Relative Name and Phone Number:  ?Nhyla, Nappi [phone],VOZD,GUYQ Son [phone] Son [phone] ?   ?Current Level of Care: ?Hospital Recommended Level of Care: ?[place] Prior Approval Number: ?  ? ?Date Approved/Denied: ?  PASRR Number: ?[id number] A ? ?Discharge Plan: ?SNF ?  ? ?Current Diagnoses: ?Patient Active Problem List  ? Diagnosis Date Noted  ? HCAP (healthcare-associated pneumonia) 04/13/2022  ? Sepsis ([place]) 04/12/2022  ? Hypokalemia 04/06/2022  ? Primary CNS lymphoma ([place]) 03/10/2022  ? UTI (urinary tract infection) 03/05/2022  ? Acute encephalopathy 03/05/2022  ? Goals of care, counseling/discussion 02/17/2022  ? CNS lymphoma ([place]) 01/27/2022  ? Brain mass 01/02/2022  ? Brain lesion 12/31/2021  ? New onset atrial fibrillation ([place]) 12/31/2021  ? Prolonged QT interval 12/31/2021  ? Hypertension   ? Prediabetes   ? ? ?Orientation RESPIRATION BLADDER Height & Weight   ?  ?Self, Time, Place ? O2 (3L O2 ) Continent Weight: 95.3 kg ?Height:  '5\' 7"'$  (170.2 cm)  ?BEHAVIORAL SYMPTOMS/MOOD NEUROLOGICAL BOWEL NUTRITION STATUS  ?    Continent Diet (Soft diet)  ?AMBULATORY STATUS COMMUNICATION OF NEEDS Skin   ?Extensive Assist Verbally PU Stage and Appropriate Care (Left heel stage 1, Skin tear left arm, Buttock left Moisture) ?PU Stage 1 Dressing: Daily ?  ?  ?    ?     ?     ? ? ?Personal Care Assistance Level of Assistance  ?Bathing, Feeding, Dressing Bathing Assistance: Maximum assistance ?Feeding assistance: Independent ?Dressing Assistance: Maximum assistance ?    ? ?Functional Limitations Info  ?Sight, Hearing, Speech Sight Info: Adequate ?Hearing Info: Adequate ?Speech Info: Impaired  ? ? ?SPECIAL CARE FACTORS FREQUENCY  ?PT (By licensed PT), OT (By licensed OT)   ?  ?PT Frequency: x5 week ?OT Frequency: x5 week ?  ?  ?  ?   ? ? ?Contractures Contractures Info: Not present  ? ? ?Additional Factors Info  ?Code Status, Allergies Code Status Info: FULL ?Allergies Info: Codeine, Lisinopril, Hydrocodone-acetaminophen ?  ?  ?  ?   ? ?Current Medications (04/14/2022):  This is the current hospital active medication list ?Current Facility-Administered Medications  ?Medication Dose Route Frequency Provider Last Rate Last Admin  ? acetaminophen (TYLENOL) tablet 650 mg  650 mg Oral Q6H PRN Elwyn Reach, MD   650 mg at 04/14/22 0953  ? Or  ? acetaminophen (TYLENOL) suppository 650 mg  650 mg Rectal Q6H PRN Elwyn Reach, MD      ? ceFEPIme (MAXIPIME) 2 g in sodium chloride 0.9 % 100 mL IVPB  2 g Intravenous Q12H Gala Romney L, MD 200 mL/hr at 04/14/22 0535 2 g at 04/14/22 0535  ? Chlorhexidine Gluconate Cloth 2 % PADS 6 each  6 each Topical Daily Vanna Scotland, MD   6 each at 04/14/22 1312  ? enoxaparin (LOVENOX) injection 40 mg  40 mg Subcutaneous Q24H Gala Romney L, MD   40 mg at 04/14/22 0950  ? gabapentin (NEURONTIN) capsule 900 mg  900 mg Oral BID Vanna Scotland, MD   900 mg at 04/14/22 1314  ?  guaiFENesin (MUCINEX) 12 hr tablet 600 mg  600 mg Oral BID Irene Pap N, DO   600 mg at 04/14/22 1531  ? guaiFENesin-dextromethorphan (ROBITUSSIN DM) 100-10 MG/5ML syrup 5 mL  5 mL Oral Q4H PRN Irene Pap N, DO      ? irbesartan (AVAPRO) tablet 150 mg  150 mg Oral QHS Alanda Amass, Aseem, MD      ? lactated ringers infusion   Intravenous Continuous Elwyn Reach, MD 125 mL/hr at 04/14/22 1512 New Bag at 04/14/22 1512  ? metoprolol tartrate (LOPRESSOR) tablet 12.5 mg  12.5 mg Oral BID Irene Pap N, DO   12.5 mg at 04/14/22 1100  ? polyvinyl alcohol (LIQUIFILM TEARS) 1.4 %  ophthalmic solution 1 drop  1 drop Both Eyes PRN Vanna Scotland, MD      ? sodium chloride flush (NS) 0.9 % injection 10-40 mL  10-40 mL Intracatheter PRN Vanna Scotland, MD      ? ? ? ?Discharge Medications: ?Please see discharge summary for a list of discharge medications. ? ?Relevant Imaging Results: ? ?Relevant Lab Results: ? ? ?Additional Information ?[phone] ? ?Finley Chevez, RN ? ? ? ? ?

## 2022-04-14 NOTE — Evaluation (Signed)
Physical Therapy Evaluation ?Patient Details ?Name: Isabel Gibbs ?MRN: [id number] ?DOB: 11/29/1946 ?Today's Date: 04/14/2022 ? ?History of Present Illness ? Patient is a 76 y.o. female with PMH: HTN, prediabetes, trigeminal neuralgia of right side of face, who presented to the hospital with several days lethargy, confusion, fever, chills. She was found to meet criteria for sepsis with unclear source. 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 CXR showed Increased density in the right upper and left lower lung fields may  suggest atelectasis/pneumonia.  ?Clinical Impression ? Patient has returned to [facility] after DC 1 ago. Per son, patient requiring 2 max assist to stand briefly for pericare. Patient remains in a lift chair at home with 24/7 caregivers. ?Patient as a wound on right buttock<present previously. ? Per son, a hospital bed and Hoyer lift delivered to the home  after DC last week ?Patient's BP 114/65, 95% on 3 L. HR 85. ?Patient assisted  with +2 max to sit on bed edge x ~ 10 minutes and worked on sitting balance, weight shifting. Patient did not require any support. Did not attempt standing today as patient will require a sara STEDY for safe attempts. ? Pt admitted with above diagnosis.  Pt currently with functional limitations due to the deficits listed below (see PT Problem List). Pt will benefit from skilled PT to increase their independence and safety with mobility to allow discharge to the venue listed below.   ?99% ?   ? ?Recommendations for follow up therapy are one component of a multi-disciplinary discharge planning process, led by the attending physician.  Recommendations may be updated based on patient status, additional functional criteria and insurance authorization. ? ?Follow Up Recommendations Skilled nursing-short term rehab (<3 hours/day) ? ?  ?Assistance Recommended at Discharge Frequent or  constant Supervision/Assistance  ?Patient can return home with the following ? Two people to help with walking and/or transfers;A lot of help with bathing/dressing/bathroom;Assistance with cooking/housework;Help with stairs or ramp for entrance;Assist for transportation ? ?  ?Equipment Recommendations None recommended by PT  ?Recommendations for Other Services ?    ?  ?Functional Status Assessment Patient has had a recent decline in their functional status and demonstrates the ability to make significant improvements in function in a reasonable and predictable amount of time.  ? ?  ?Precautions / Restrictions Precautions ?Precautions: Fall ?Precaution Comments: wound on buttocks, incontinent  ? ?  ? ?Mobility ? Bed Mobility ?  ?Bed Mobility: Rolling, Sidelying to Sit, Sit to Sidelying ?Rolling: Max assist, +2 for physical assistance ?Sidelying to sit: Max assist, +2 for safety/equipment, +2 for physical assistance, HOB elevated ?  ?  ?Sit to sidelying: Max assist, +2 for safety/equipment, +2 for physical assistance ?General bed mobility comments: +2 max for all aspects of  mobility: rolling to sit and back to supine ?  ? ?Transfers ?  ?  ?  ?  ?  ?  ?  ?  ?  ?General transfer comment: NT, will need stedy ?  ? ?Ambulation/Gait ?  ?  ?  ?  ?  ?  ?  ?  ? ?Stairs ?  ?  ?  ?  ?  ? ?Wheelchair Mobility ?  ? ?Modified Rankin (Stroke Patients Only) ?  ? ?  ? ?Balance Overall balance assessment: Needs assistance ?Sitting-balance support: Feet supported, Bilateral upper extremity supported ?Sitting balance-Leahy Scale: Fair ?Sitting balance - Comments: patient able to sit at  midline, difficulty to weight shift to the right, ?Postural control: Left lateral lean ?  ?  ?  ?  ?  ?  ?  ?  ?  ?  ?  ?  ?  ?  ?   ? ? ? ?Pertinent Vitals/Pain Pain Assessment ?Faces Pain Scale: Hurts even more ?Pain Location: wound on buttocks, periarea ?Pain Intervention(s): Monitored during session, Limited activity within patient's tolerance,  Repositioned  ? ? ?Home Living Family/patient expects to be discharged to:: Private residence ?Living Arrangements: Children ?Available Help at Discharge: Family;Available 24 hours/day ?Type of Home: House ?Home Access: Ramped entrance ?  ?  ?  ?Home Layout: One level ?  ?Additional Comments: Sleeps in lift recliner  ?  ?Prior Function   ?  ?  ?  ?  ?  ?  ?Mobility Comments: since recent hospitalization 1 week ago, patient has been sleeping in recliner, requires 2 max assist for standing 10 secs to change brief.. has not ambulated in several months ?ADLs Comments: sponge bathing with assistance ?  ? ? ?Hand Dominance  ?   ? ?  ?Extremity/Trunk Assessment  ? Upper Extremity Assessment ?Upper Extremity Assessment: Generalized weakness ?  ? ?Lower Extremity Assessment ?Lower Extremity Assessment: Generalized weakness ?  ? ?Cervical / Trunk Assessment ?Cervical / Trunk Assessment: Normal  ?Communication  ? Communication: No difficulties  ?Cognition Arousal/Alertness: Awake/alert ?Behavior During Therapy: [facility] for tasks assessed/performed ?Overall Cognitive Status: Impaired/Different from baseline ?Area of Impairment: Memory, Orientation ?  ?  ?  ?  ?  ?  ?  ?  ?Orientation Level: Time, Situation ?  ?  ?  ?  ?  ?  ?General Comments: patient thought that she has been here 1 week ?  ?  ? ?  ?General Comments   ? ?  ?Exercises    ? ?Assessment/Plan  ?  ?PT Assessment Patient needs continued PT services  ?PT Problem List Decreased activity tolerance;Decreased strength;Decreased mobility;Pain ? ?   ?  ?PT Treatment Interventions Therapeutic activities;Therapeutic exercise;Functional mobility training;Patient/family education;Balance training   ? ?PT Goals (Current goals can be found in the Care Plan section)  ?Acute Rehab PT Goals ?Patient Stated Goal: to get stronger and  out of bed ?PT Goal Formulation: With patient/family ?Time For Goal Achievement: 04/28/22 ?Potential to Achieve Goals: Fair ? ?  ?Frequency Min 2X/week ?   ? ? ?Co-evaluation   ?  ?  ?  ?  ? ? ?  ?AM-PAC PT "6 Clicks" Mobility  ?Outcome Measure Help needed turning from your back to your side while in a flat bed without using bedrails?: A Lot ?Help needed moving from lying on your back to sitting on the side of a flat bed without using bedrails?: Total ?Help needed moving to and from a bed to a chair (including a wheelchair)?: Total ?Help needed standing up from a chair using your arms (e.g., wheelchair or bedside chair)?: Total ?Help needed to walk in hospital room?: Total ?Help needed climbing 3-5 steps with a railing? : Total ?6 Click Score: 7 ? ?  ?End of Session   ?Activity Tolerance: Patient tolerated treatment well ?Patient left: in bed;with bed alarm set;with call bell/phone within reach;with family/visitor present ?Nurse Communication: Mobility status;Need for lift equipment ?PT Visit Diagnosis: Difficulty in walking, not elsewhere classified (R26.2);Other abnormalities of gait and mobility (R26.89);Muscle weakness (generalized) (M62.81) ?  ? ?Time: 1517-6160 ?PT Time Calculation (min) (ACUTE ONLY): 40 min ? ? ?Charges:   PT  Evaluation ?$PT Eval Low Complexity: 1 Low ?PT Treatments ?$Therapeutic Activity: 23-37 mins ?  ?   ? ? ?Tresa Endo PT ?Acute Rehabilitation Services ?Pager [phone] ?Office [phone] ? ? ?Isabel Gibbs, Shella Maxim ?04/14/2022, 5:02 PM ? ?

## 2022-04-14 NOTE — Progress Notes (Signed)
Patient had large unmeasured urine occurrence in bed, and medium sized mushy bowel movement. ? ?Angie Fava, RN  ?

## 2022-04-15 ENCOUNTER — Ambulatory Visit: Payer: Medicare Other | Admitting: Internal Medicine

## 2022-04-15 DIAGNOSIS — A419 Sepsis, unspecified organism: Secondary | ICD-10-CM | POA: Diagnosis not present

## 2022-04-15 LAB — CBC WITH DIFFERENTIAL/PLATELET
Abs Immature Granulocytes: 0.04 10*3/uL (ref 0.00–0.07)
Basophils Absolute: 0 10*3/uL (ref 0.0–0.1)
Basophils Relative: 1 %
Eosinophils Absolute: 0.2 10*3/uL (ref 0.0–0.5)
Eosinophils Relative: 5 %
HCT: 23.7 % — ABNORMAL LOW (ref 36.0–46.0)
Hemoglobin: 7.5 g/dL — ABNORMAL LOW (ref 12.0–15.0)
Immature Granulocytes: 1 %
Lymphocytes Relative: 30 %
Lymphs Abs: 1.3 10*3/uL (ref 0.7–4.0)
MCH: 29.8 pg (ref 26.0–34.0)
MCHC: 31.6 g/dL (ref 30.0–36.0)
MCV: 94 fL (ref 80.0–100.0)
Monocytes Absolute: 0.8 10*3/uL (ref 0.1–1.0)
Monocytes Relative: 18 %
Neutro Abs: 2 10*3/uL (ref 1.7–7.7)
Neutrophils Relative %: 45 %
Platelets: 300 10*3/uL (ref 150–400)
RBC: 2.52 MIL/uL — ABNORMAL LOW (ref 3.87–5.11)
RDW: 16.3 % — ABNORMAL HIGH (ref 11.5–15.5)
WBC: 4.4 10*3/uL (ref 4.0–10.5)
nRBC: 0 % (ref 0.0–0.2)

## 2022-04-15 LAB — COMPREHENSIVE METABOLIC PANEL
ALT: 25 U/L (ref 0–44)
AST: 16 U/L (ref 15–41)
Albumin: 2.1 g/dL — ABNORMAL LOW (ref 3.5–5.0)
Alkaline Phosphatase: 63 U/L (ref 38–126)
Anion gap: 6 (ref 5–15)
BUN: 21 mg/dL (ref 8–23)
CO2: 33 mmol/L — ABNORMAL HIGH (ref 22–32)
Calcium: 7.9 mg/dL — ABNORMAL LOW (ref 8.9–10.3)
Chloride: 103 mmol/L (ref 98–111)
Creatinine, Ser: 1.11 mg/dL — ABNORMAL HIGH (ref 0.44–1.00)
GFR, Estimated: 51 mL/min — ABNORMAL LOW (ref >60)
Glucose, Bld: 124 mg/dL — ABNORMAL HIGH (ref 70–99)
Potassium: 3.3 mmol/L — ABNORMAL LOW (ref 3.5–5.1)
Sodium: 142 mmol/L (ref 135–145)
Total Bilirubin: 0.6 mg/dL (ref 0.3–1.2)
Total Protein: 4.7 g/dL — ABNORMAL LOW (ref 6.5–8.1)

## 2022-04-15 LAB — PROCALCITONIN: Procalcitonin: 0.29 ng/mL

## 2022-04-15 LAB — URINE CULTURE: Culture: >=100000 — AB

## 2022-04-15 LAB — BRAIN NATRIURETIC PEPTIDE: B Natriuretic Peptide: 751.9 pg/mL — ABNORMAL HIGH (ref 0.0–100.0)

## 2022-04-15 MED ORDER — POTASSIUM CHLORIDE 10 MEQ/100ML IV SOLN
INTRAVENOUS | Status: AC
  Filled 2022-04-15: qty 100

## 2022-04-15 MED ORDER — SODIUM CHLORIDE 0.9 % IV SOLN
3.0000 g | Freq: Four times a day (QID) | INTRAVENOUS | Status: DC
  Administered 2022-04-15 – 2022-04-17 (×7): 3 g via INTRAVENOUS
  Filled 2022-04-15 (×10): qty 8

## 2022-04-15 MED ORDER — POTASSIUM CHLORIDE 10 MEQ/100ML IV SOLN
10.0000 meq | INTRAVENOUS | Status: AC
  Administered 2022-04-15 (×4): 10 meq via INTRAVENOUS
  Filled 2022-04-15 (×2): qty 100

## 2022-04-15 MED ORDER — ORAL CARE MOUTH RINSE
15.0000 mL | Freq: Two times a day (BID) | OROMUCOSAL | Status: DC
  Administered 2022-04-16 – 2022-04-17 (×3): 15 mL via OROMUCOSAL

## 2022-04-15 MED ORDER — IPRATROPIUM-ALBUTEROL 0.5-2.5 (3) MG/3ML IN SOLN
3.0000 mL | Freq: Three times a day (TID) | RESPIRATORY_TRACT | Status: DC
  Administered 2022-04-15 (×3): 3 mL via RESPIRATORY_TRACT
  Filled 2022-04-15 (×3): qty 3

## 2022-04-15 MED ORDER — LIP MEDEX EX OINT
TOPICAL_OINTMENT | CUTANEOUS | Status: AC
  Filled 2022-04-15: qty 7

## 2022-04-15 MED ORDER — LIP MEDEX EX OINT
TOPICAL_OINTMENT | CUTANEOUS | Status: DC | PRN
  Administered 2022-04-15: 75 via TOPICAL

## 2022-04-15 MED ORDER — IPRATROPIUM-ALBUTEROL 0.5-2.5 (3) MG/3ML IN SOLN: 3.0000 mL | Freq: Four times a day (QID) | RESPIRATORY_TRACT | Status: DC | PRN

## 2022-04-15 MED ORDER — METOPROLOL TARTRATE 50 MG PO TABS
50.0000 mg | ORAL_TABLET | Freq: Two times a day (BID) | ORAL | Status: DC
  Administered 2022-04-15 – 2022-04-16 (×2): 50 mg via ORAL
  Filled 2022-04-15 (×2): qty 1

## 2022-04-15 MED ORDER — CHLORHEXIDINE GLUCONATE 0.12 % MT SOLN
15.0000 mL | Freq: Two times a day (BID) | OROMUCOSAL | Status: DC
  Administered 2022-04-15 – 2022-04-18 (×6): 15 mL via OROMUCOSAL
  Filled 2022-04-15 (×5): qty 15

## 2022-04-15 NOTE — Progress Notes (Addendum)
?Progress Note ? ? ?Patient: Isabel Gibbs EQA:[id number] DOB: August 17, 1946 DOA: 04/12/2022     3 ?DOS: the patient was seen and examined on 04/15/2022 ?  ?Brief hospital course: ?76 y.o. female with medical history significant of CNS lymphoma, essential hypertension and prediabetes who presented to the ER with fever for 2 days.  Also generalized weakness.  Denied any cough denied any nausea vomiting or diarrhea.  Patient has been on treatment for her lymphoma.  She was recently in rehab good discharged on April 3.  She has been undergoing physical therapy.  She is currently on methotrexate with induction rituximab with the last treatment 5 days ago.  Patient came in on was seen and evaluated.  Initial temperature rectally was 101.  She seems to have met sepsis criteria.  Chest x-ray suggested pneumonia and urinalysis.  Suggested UTI.  Patient is therefore being admitted with sepsis syndrome.  ?  ?4/16 on 1L [place] 92-94%; family came by; patient coughing with fluid intake, will request SLP evaluation. Repeat CXR, check Ddimer given ongoing tachycardia/hypoxia.  ? ?Hospital course complicated by intermittent A-fib with RVR.  She was started on p.o. Lopressor 12.5 mg twice daily and cardiology was consulted.  Also complicated by volume overload for which she was started on diuretics IV Lasix 20 mg daily. ? ? ?04/15/2022: Patient was seen at her bedside.  She states she is breathing a lot better.  Oxygen requirement is improving from 5 L to 3 L nasal cannula. ? ?Assessment and Plan: ?HCAP (healthcare-associated pneumonia) ?Continue pulm hygeine ?Wean o2 as tolerated ?OOB as much as tolerated to chair ?Sepsis likely from LRI or UTI, will continue to monitor ?If tachycardia/hypoxia worsen consider CTA Chest,  ?(she has received 3L IVF in the last 24H) ? ? ? ?UTI (urinary tract infection) ?Patient meets sepsis criteria with temperature 101, respiratory rate of 22 on arrival, evidence of UTI.  Also evidence of pneumonia.  With recent  hospitalization with appears this could be HCAP. Patient will be admitted and initiated on IV vancomycin and cefepime. ? ?Blood cultures have been obtained.  Urine cultures have been obtained and will follow results. ? ? ?CNS lymphoma ([place]) ?Undergoing chemotherapy.  Patient has corresponding anemia but no thrombocytopenia.  Continue to monitor ?Monitor electrolytes, improve nutrition, PT eval ? ?Followed by Dr. Mickeal Skinner for CNS lymphoma ?Dr. Mickeal Skinner, plan after discharge to be determined, pending her functional status. ? ?Enterococcus faecalis UTI, pansensitive, POA ?Continue Unasyn ? ?New A-fib with RVR, intermittently ?CHA2DS2-VASc of 4 ?Started p.o. Lopressor 12.5 mg twice daily ?2D echo completed on 04/14/2022 showed LVEF 50%, left ventricle demonstrated global hypokinesis, grade 1 diastolic dysfunction ?Volume overload with ongoing diuresing. ?Cardiology following, appreciate assistance. ?Monitor strict I's and O's and daily weight. ? ?Physical debility ?PT OT to assess ?Fall precautions ? ?Dysphagia ?Restarted dysphagia 3 diet ?Aspiration precaution ?Speech therapist consulted to provide further recommendations. ? ? ?  ? ? ?Physical Exam: ?Vitals:  ? 04/15/22 1030 04/15/22 1416 04/15/22 1422 04/15/22 1434  ?BP: (!) 106/58 (!) 103/53    ?Pulse:  (!) 102 81   ?Resp:  17 17   ?Temp:  98.5 ?F (36.9 ?C)    ?TempSrc:  Oral    ?SpO2:  99% 100% 95%  ?Weight:      ?Height:      ? ?General: Chronically ill-appearing in no acute stress.  She is alert and interactive. ?HEENT:.  PERRL, EOMI, no pallor no jaundice no rhinorrhea ?Neck: Supple, no JVD no lymphadenopathy ?Respiratory:  Mild rales at bases.  No wheezing noted.  Poor inspiratory effort. ?Cardiovascular system: Irregular rate and rhythm no rubs or gallops. ?Abdomen: Some of the normal bowel sounds present. ?Extremities: 1+ pitting edema in lower extremities bilaterally. ? ?Data Reviewed: ?  ?Results for orders placed or performed during the hospital encounter of  04/12/22 (from the past 24 hour(s))  ?CBC with Differential/Platelet     Status: Abnormal  ? Collection Time: 04/15/22  3:58 AM  ?Result Value Ref Range  ? WBC 4.4 4.0 - 10.5 K/uL  ? RBC 2.52 (L) 3.87 - 5.11 MIL/uL  ? Hemoglobin 7.5 (L) 12.0 - 15.0 g/dL  ? HCT 23.7 (L) 36.0 - 46.0 %  ? MCV 94.0 80.0 - 100.0 fL  ? MCH 29.8 26.0 - 34.0 pg  ? MCHC 31.6 30.0 - 36.0 g/dL  ? RDW 16.3 (H) 11.5 - 15.5 %  ? Platelets 300 150 - 400 K/uL  ? nRBC 0.0 0.0 - 0.2 %  ? Neutrophils Relative % 45 %  ? Neutro Abs 2.0 1.7 - 7.7 K/uL  ? Lymphocytes Relative 30 %  ? Lymphs Abs 1.3 0.7 - 4.0 K/uL  ? Monocytes Relative 18 %  ? Monocytes Absolute 0.8 0.1 - 1.0 K/uL  ? Eosinophils Relative 5 %  ? Eosinophils Absolute 0.2 0.0 - 0.5 K/uL  ? Basophils Relative 1 %  ? Basophils Absolute 0.0 0.0 - 0.1 K/uL  ? Immature Granulocytes 1 %  ? Abs Immature Granulocytes 0.04 0.00 - 0.07 K/uL  ?Procalcitonin - Baseline     Status: None  ? Collection Time: 04/15/22  3:58 AM  ?Result Value Ref Range  ? Procalcitonin 0.29 ng/mL  ?Comprehensive metabolic panel     Status: Abnormal  ? Collection Time: 04/15/22  3:58 AM  ?Result Value Ref Range  ? Sodium 142 135 - 145 mmol/L  ? Potassium 3.3 (L) 3.5 - 5.1 mmol/L  ? Chloride 103 98 - 111 mmol/L  ? CO2 33 (H) 22 - 32 mmol/L  ? Glucose, Bld 124 (H) 70 - 99 mg/dL  ? BUN 21 8 - 23 mg/dL  ? Creatinine, Ser 1.11 (H) 0.44 - 1.00 mg/dL  ? Calcium 7.9 (L) 8.9 - 10.3 mg/dL  ? Total Protein 4.7 (L) 6.5 - 8.1 g/dL  ? Albumin 2.1 (L) 3.5 - 5.0 g/dL  ? AST 16 15 - 41 U/L  ? ALT 25 0 - 44 U/L  ? Alkaline Phosphatase 63 38 - 126 U/L  ? Total Bilirubin 0.6 0.3 - 1.2 mg/dL  ? GFR, Estimated 51 (L) >60 mL/min  ? Anion gap 6 5 - 15  ?Brain natriuretic peptide     Status: Abnormal  ? Collection Time: 04/15/22  3:58 AM  ?Result Value Ref Range  ? B Natriuretic Peptide 751.9 (H) 0.0 - 100.0 pg/mL  ? ? ?Disposition: ?Status is: Inpatient ?Remains inpatient appropriate because: hypoxia, sepsis, UTI ? Planned Discharge Destination:  Home ? ? ? ?Time spent:  ?35 minutes ? ?Author: ?Kayleen Memos, DO ?04/15/2022 4:14 PM ? ?For on call review [email or web address].  ?

## 2022-04-15 NOTE — Progress Notes (Signed)
Weaned patient down to 3lpm [place] for 10 -15 minutes before she had a decrease in oxygen saturation to 81%. Patient was immediately increased back to 5L  [place]. She is tolerating well at this time with saturation 96%. The patient doesn't appear to be in any distress at this time. RT will continue to monitor ?

## 2022-04-15 NOTE — Progress Notes (Signed)
Bladder checked post furosemide dose. Patient retains 48m of urine per bladder scan. JClarene EssexNP was notified and ordered in and out catheter. Drained 4053mof yellowish cloudy urine. Residual urine left was 1330m ?

## 2022-04-15 NOTE — Progress Notes (Signed)
Subjective:  ?Patient reports subjective improvement of shortness of breath this morning.  Presently on 5 L/min via nasal cannula. ?Resting comfortably in bed.  Denies chest pain, palpitations, dizziness. ? ?Intake/Output from previous day: ? ?I/O last 3 completed shifts: ?In: 3998.1 [P.O.:265; I.V.:3333; IV Piggyback:400.1] ?Out: 8889 [[id number]] ?No intake/output data recorded. ? ?Blood pressure 99/84, pulse 77, temperature 98.5 ?F (36.9 ?C), temperature source Oral, resp. rate 17, height _0  (1.702 m), weight 95.3 kg, SpO2 100 %. ?Physical Exam ?Vitals reviewed.  ?Constitutional:   ?   Appearance: She is obese.  ?Cardiovascular:  ?   Rate and Rhythm: Normal rate. Rhythm irregular.  ?   Pulses: Intact distal pulses.  ?   Heart sounds: S1 normal and S2 normal. No murmur heard. ?  No gallop.  ?Pulmonary:  ?   Effort: Pulmonary effort is normal. No respiratory distress.  ?   Breath sounds: Rales (bilateraly bases) present. No wheezing or rhonchi.  ?   Comments: On 5 L/min via nasal cannula ?Musculoskeletal:  ?   Right lower leg: Edema (2+ to midshin) present.  ?   Left lower leg: Edema (2+ to midshin) present.  ?Neurological:  ?   Mental Status: She is alert.  ? ? ?Lab Results: ?BMP ?BNP (last 3 results) ?Recent Labs  ?  04/15/22 ?0358  ?BNP 751.9*  ? ? ?ProBNP (last 3 results) ?No results for input(s): PROBNP in the last 8760 hours. ? ?  Latest Ref Rng & Units 04/15/2022  ?  3:58 AM 04/13/2022  ? 10:42 PM 04/13/2022  ?  4:06 AM  ?BMP  ?Glucose 70 - 99 mg/dL 124   124   174    ?BUN 8 - 23 mg/dL _1 ?Creatinine 0.44 - 1.00 mg/dL 1.11   1.25   1.13    ?Sodium 135 - 145 mmol/L 142   142   142    ?Potassium 3.5 - 5.1 mmol/L 3.3   3.5   4.6    ?Chloride 98 - 111 mmol/L 103   106   104    ?CO2 22 - 32 mmol/L 33   31   30    ?Calcium 8.9 - 10.3 mg/dL 7.9   8.1   8.3    ? ? ?  Latest Ref Rng & Units 04/15/2022  ?  3:58 AM 04/13/2022  ? 10:42 PM 04/13/2022  ?  4:06 AM  ?Hepatic Function  ?Total Protein 6.5 - 8.1 g/dL  4.7    5.7    ?Albumin 3.5 - 5.0 g/dL 2.1   2.5   2.8    ?AST 15 - 41 U/L 16    17    ?ALT 0 - 44 U/L 25    39    ?Alk Phosphatase 38 - 126 U/L 63    77    ?Total Bilirubin 0.3 - 1.2 mg/dL 0.6    0.9    ? ? ?  Latest Ref Rng & Units 04/15/2022  ?  3:58 AM 04/13/2022  ?  8:16 AM 04/12/2022  ? 11:02 PM  ?CBC  ?WBC 4.0 - 10.5 K/uL 4.4   5.4   4.4    ?Hemoglobin 12.0 - 15.0 g/dL 7.5   8.1   8.6    ?Hematocrit 36.0 - 46.0 % 23.7   26.7   27.9    ?Platelets 150 - 400 K/uL 300   205   189    ? ?  Lipid Panel  ?No results found for: CHOL, TRIG, HDL, CHOLHDL, VLDL, LDLCALC, LDLDIRECT ?Cardiac Panel (last 3 results) ?No results for input(s): CKTOTAL, CKMB, TROPONINI, RELINDX in the last 72 hours. ? ?HEMOGLOBIN A1C ?Lab Results  ?Component Value Date  ? HGBA1C 6.0 (H) 03/05/2022  ? MPG 125.5 03/05/2022  ? ?TSH ?Recent Labs  ?  12/31/21 ?0726  ?TSH 0.871  ? ?Imaging: ?Chest x-ray 04/14/2022: ?Left rotated chest radiograph. Right internal jugular Port-A-Cath ?terminates over the cavoatrial junction. Stable cardiomediastinal ?silhouette with mild cardiomegaly. No pneumothorax. Small left ?pleural effusion is stable. No significant right pleural effusion. ?Stable mild left basilar atelectasis. Stable borderline mild ?pulmonary edema. ?IMPRESSION: ?1. Stable borderline mild congestive heart failure. ?2. Stable small left pleural effusion and mild left basilar atelectasis. ? ?MRI brain 04/12/2022: ?1. Substantial regression of CNS Lymphoma since last month. ?Residual left > right anterior frontal and right basal ganglia ?tumor. But virtually resolved intracranial mass effect. No new sites ?of disease identified. ?2. No new intracranial abnormality. ? ?Cardiac Studies: ?Echocardiogram 04/14/2022: ?1. Left ventricular ejection fraction, by estimation, is 50%. The left  ?ventricle has mildly decreased function. The left ventricle demonstrates  ?global hypokinesis. There is mild left ventricular hypertrophy. Left  ?ventricular diastolic  parameters are  ?consistent with Grade I diastolic dysfunction (impaired relaxation).  ? 2. Right ventricular systolic function is normal. The right ventricular  ?size is normal. Tricuspid regurgitation signal is inadequate for assessing  ?PA pressure.  ? 3. Left atrial size was mildly dilated.  ? 4. The mitral valve is normal in structure. No evidence of mitral valve  ?regurgitation. No evidence of mitral stenosis.  ? 5. The aortic valve is tricuspid. Aortic valve regurgitation is not  ?visualized. Aortic valve sclerosis/calcification is present, without any  ?evidence of aortic stenosis.  ? 6. The inferior vena cava is normal in size with greater than 50%  ?respiratory variability, suggesting right atrial pressure of 3 mmHg.  ? 7. Technically difficult study with poor acoustic windows. ? ?EKG:  ?Repeat EKG ordered and pending.  ?04/14/2022 at 10:53 AM: Atrial fibrillation with rapid ventricular response at rate of 124 bpm, normal axis, low-voltage complexes, nonspecific T abnormality. ? ?Scheduled Meds: ? Chlorhexidine Gluconate Cloth  6 each Topical Daily  ? enoxaparin (LOVENOX) injection  40 mg Subcutaneous Q24H  ? furosemide  20 mg Intravenous Daily  ? gabapentin  900 mg Oral BID  ? guaiFENesin  600 mg Oral BID  ? ipratropium-albuterol  3 mL Nebulization TID  ? metoprolol tartrate  25 mg Oral TID  ? potassium chloride  20 mEq Oral Daily  ? ?Continuous Infusions: ? ceFEPime (MAXIPIME) IV 2 g (04/15/22 0601)  ? ?PRN Meds:.acetaminophen **OR** acetaminophen, guaiFENesin-dextromethorphan, polyvinyl alcohol, sodium chloride flush ? ?Assessment/Plan:  ?On review of cardiac telemetry patient appears to be flipping in and out of sinus rhythm and atrial fibrillation versus MAT.  Patient's heart rate control has improved, approximately 95 bpm at time of exam this morning.  I have ordered repeat EKG to confirm MAT versus atrial tachycardia versus paroxysmal atrial fibrillation. ? ?She also likely has a component of  diastolic heart failure and is being overloaded on exam.  Chest x-ray revealed mild pulmonary edema and BNP elevated at 751.  Recommend continued diuresis with Lasix 20 mg IV daily and potassium supplementation accordingly.  We will continue to monitor daily weights and strict I&O's.  Given soft blood pressure and renal function will avoid avoid ACE inhibitors or ARB use.  We will  continue Lopressor 25 mg p.o. 3 times daily and continue to monitor blood pressure closely. ? ?Further recommendations regarding anticoagulation pending repeat EKG to confirm MAT versus atrial tachycardia versus paroxysmal atrial fibrillation. ? ?Patient was seen in collaboration with Dr. Einar Gip.  ? ? ?Alethia Berthold, PA-C ?04/15/2022, 8:12 AM ?Office: [phone] ? ?

## 2022-04-15 NOTE — Evaluation (Signed)
Occupational Therapy Evaluation ?Patient Details ?Name: Isabel Gibbs ?MRN: [id number] ?DOB: 10-Apr-1946 ?Today's Date: 04/15/2022 ? ? ?History of Present Illness Patient is a 76 y.o. female with PMH: HTN, prediabetes, trigeminal neuralgia of right side of face, who presented to the hospital with several days lethargy, confusion, fever, chills. She was found to meet criteria for sepsis with unclear source. 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 CXR showed Increased density in the right upper and left lower lung fields may  suggest atelectasis/pneumonia.  ? ?Clinical Impression ?  ?Patient familiar to this OT from previous admission in March. Patient was ambulatory short distances with walker at that time however patient with global weakness and primarily sitting in lift chair at home. Per PT eval son has hired caregiver to assist with ADLs and patient able to stand for ~10 seconds to change soiled briefs but has not been able to transfer. Patient needing max A +2 for bed mobility. Attempted with OT to stand from stedy three times with bed height elevated and Max A +1 available at the time, patient unable. Will trial acute OT services in hopes of patient gaining functional strength to assist with mobility for pressure relief as well as reduce caregiver burden.   ?   ? ?Recommendations for follow up therapy are one component of a multi-disciplinary discharge planning process, led by the attending physician.  Recommendations may be updated based on patient status, additional functional criteria and insurance authorization.  ? ?Follow Up Recommendations ? Skilled nursing-short term rehab (<3 hours/day)  ?  ?Assistance Recommended at Discharge Frequent or constant Supervision/Assistance  ?Patient can return home with the following Two people to help with walking and/or transfers;Two people to help with  bathing/dressing/bathroom;Assistance with cooking/housework;Direct supervision/assist for medications management;Direct supervision/assist for financial management;Assist for transportation;Help with stairs or ramp for entrance ? ?  ?Functional Status Assessment ? Patient has had a recent decline in their functional status and/or demonstrates limited ability to make significant improvements in function in a reasonable and predictable amount of time  ?Equipment Recommendations ? None recommended by OT  ?  ?   ?Precautions / Restrictions Precautions ?Precautions: Fall ?Precaution Comments: wound on buttocks, incontinent ?Restrictions ?Weight Bearing Restrictions: No  ? ?  ? ?Mobility Bed Mobility ?Overal bed mobility: Needs Assistance ?Bed Mobility: Rolling, Sidelying to Sit, Sit to Sidelying ?Rolling: Max assist, +2 for physical assistance, +2 for safety/equipment ?Sidelying to sit: Max assist, HOB elevated ?  ?  ?Sit to sidelying: Max assist, +2 for safety/equipment, +2 for physical assistance ?General bed mobility comments: Attempt to have patient roll to sidelying position, minimally able to reach for bed rail and unable to roll onto R hip. Patient needing maximal assist to bring legs off edge of bed and upright trunk. RN present to assist getting patient back into bed needing max A +2 for legs and trunk support. Patient very stiff/rigid ?  ? ? ? ?  ?Balance Overall balance assessment: Needs assistance ?Sitting-balance support: Feet supported ?Sitting balance-Leahy Scale: Fair ?  ?  ?  ?  ?Standing balance comment: Unable with use of stedy ?  ?  ?  ?  ?  ?  ?  ?  ?  ?  ?  ?   ? ?ADL either performed or assessed with clinical judgement  ? ?ADL Overall ADL's : Needs assistance/impaired ?  ?  ?Grooming: Set up;Sitting;Bed level ?  ?Upper Body  Bathing: Minimal assistance;Sitting ?  ?Lower Body Bathing: Total assistance;Sit to/from stand;Sitting/lateral leans ?  ?Upper Body Dressing : Minimal assistance;Sitting ?   ?Lower Body Dressing: Total assistance;Sitting/lateral leans;Sit to/from stand ?  ?  ?Toilet Transfer Details (indicate cue type and reason): Unable. Attempted sit to stand from stedy 3 times with bed height significantly elevated and unable. ?Toileting- Water quality scientist and Hygiene: Total assistance;Bed level ?  ?  ?  ?Functional mobility during ADLs: Total assistance ?General ADL Comments: Patient with significant weakness and decreased functional mobility since this OT last saw patient in March. Patient essentially non ambulatory and son hired caregivers to assist with ADLs such as bathing.  ? ? ? ? ?Pertinent Vitals/Pain Pain Assessment ?Pain Assessment: Faces ?Faces Pain Scale: Hurts even more ?Pain Location: wound on buttocks, periarea ?Pain Descriptors / Indicators: Grimacing, Discomfort ?Pain Intervention(s): Monitored during session  ? ? ? ?Hand Dominance   ?  ?Extremity/Trunk Assessment Upper Extremity Assessment ?Upper Extremity Assessment: Generalized weakness ?  ?Lower Extremity Assessment ?Lower Extremity Assessment: Defer to PT evaluation ?  ?  ?  ?Communication Communication ?Communication: No difficulties ?  ?Cognition Arousal/Alertness: Awake/alert ?Behavior During Therapy: [facility] for tasks assessed/performed ?Overall Cognitive Status: No family/caregiver present to determine baseline cognitive functioning ?  ?  ?  ?  ?  ?  ?  ?  ?  ?  ?  ?  ?  ?  ?  ?  ?General Comments: Patient is following 1 step directions appropriately ?  ?  ?   ?   ?   ? ? ?Home Living Family/patient expects to be discharged to:: Private residence ?Living Arrangements: Children ?Available Help at Discharge: Family;Available 24 hours/day ?Type of Home: House ?Home Access: Ramped entrance ?  ?  ?Home Layout: One level ?  ?  ?Bathroom Shower/Tub: Walk-in shower ?  ?Bathroom Toilet: Handicapped height ?  ?  ?Home Equipment: Conservation officer, nature (2 wheels);Cane - single point;Wheelchair - manual;Shower seat ?  ?Additional Comments:  Sleeps in lift recliner ?  ? ?  ?Prior Functioning/Environment Prior Level of Function : Needs assist ?  ?  ?  ?  ?  ?  ?Mobility Comments: since recent hospitalization 1 week ago, patient has been sleeping in recliner, requires 2 max assist for standing 10 secs to change brief.. has not ambulated in several months ?ADLs Comments: sponge bathing with assistance ?  ? ?  ?  ?OT Problem List: Decreased activity tolerance;Decreased strength;Obesity;Pain ?  ?   ?OT Treatment/Interventions: Self-care/ADL training;Therapeutic exercise;DME and/or AE instruction;Balance training;Patient/family education;Therapeutic activities  ?  ?OT Goals(Current goals can be found in the care plan section) Acute Rehab OT Goals ?Patient Stated Goal: Stand ?OT Goal Formulation: With patient ?Time For Goal Achievement: 04/29/22 ?Potential to Achieve Goals: Fair  ?OT Frequency: Min 2X/week ?  ? ?   ?AM-PAC OT "6 Clicks" Daily Activity     ?Outcome Measure Help from another person eating meals?: A Little ?Help from another person taking care of personal grooming?: A Little ?Help from another person toileting, which includes using toliet, bedpan, or urinal?: Total ?Help from another person bathing (including washing, rinsing, drying)?: A Lot ?Help from another person to put on and taking off regular upper body clothing?: A Little ?Help from another person to put on and taking off regular lower body clothing?: Total ?6 Click Score: 13 ?  ?End of Session Nurse Communication: Need for lift equipment;Mobility status ? ?Activity Tolerance: Patient limited by fatigue ?Patient left: in bed;with  call bell/phone within reach;with bed alarm set ? ?OT Visit Diagnosis: Muscle weakness (generalized) (M62.81)  ?              ?Time: 3419-6222 ?OT Time Calculation (min): 23 min ?Charges:  OT General Charges ?$OT Visit: 1 Visit ?OT Evaluation ?$OT Eval Low Complexity: 1 Low ?OT Treatments ?$Self Care/Home Management : 8-22 mins ? ?Delbert Phenix OT ?OT pager:  [phone] ? ?Rosemary Holms ?04/15/2022, 12:24 PM ?

## 2022-04-15 NOTE — TOC Progression Note (Signed)
Transition of Care (TOC) - Progression Note  ? ? ?Patient Details  ?Name: Isabel Gibbs ?MRN: [id number] ?Date of Birth: 01/05/1946 ? ?Transition of Care (TOC) CM/SW Contact  ?Purcell Mouton, RN ?Phone Number: ?04/15/2022, 2:25 PM ? ?Clinical Narrative:    ? ?Bed offers text to pt's son Merry Proud.  ? ?Expected Discharge Plan: [place] ?Barriers to Discharge: No Barriers Identified ? ?Expected Discharge Plan and Services ?Expected Discharge Plan: [place] ?  ?  ?Post Acute Care Choice: Home Health, [facility] ?Living arrangements for the past 2 months: [place] ?                ?  ?  ?  ?  ?  ?  ?  ?  ?  ?  ? ? ?Social Determinants of Health (SDOH) Interventions ?  ? ?Readmission Risk Interventions ? ?  03/11/2022  ?  1:36 PM  ?Readmission Risk Prevention Plan  ?Transportation Screening Complete  ?PCP or Specialist Appt within 5-7 Days Complete  ?Home Care Screening Complete  ?Medication Review (RN CM) Complete  ? ? ?

## 2022-04-15 NOTE — Progress Notes (Signed)
Patient was weaned to 3L [place] from 5L as tolerated. Her saturation is 94% at this time and patient doesn't appear to be in any distress at this time. Rt will continue to monitor ?

## 2022-04-15 NOTE — Progress Notes (Signed)
Foley catheter placed, 850 yellow urine returned.  Patient tolerated well. ? ?Angie Fava, RN  ?

## 2022-04-16 ENCOUNTER — Encounter: Payer: Self-pay | Admitting: Internal Medicine

## 2022-04-16 ENCOUNTER — Inpatient Hospital Stay (HOSPITAL_COMMUNITY): Payer: Medicare Other

## 2022-04-16 DIAGNOSIS — I4891 Unspecified atrial fibrillation: Secondary | ICD-10-CM

## 2022-04-16 DIAGNOSIS — I10 Essential (primary) hypertension: Secondary | ICD-10-CM | POA: Diagnosis not present

## 2022-04-16 DIAGNOSIS — C8589 Other specified types of non-Hodgkin lymphoma, extranodal and solid organ sites: Secondary | ICD-10-CM | POA: Diagnosis not present

## 2022-04-16 DIAGNOSIS — J189 Pneumonia, unspecified organism: Secondary | ICD-10-CM

## 2022-04-16 DIAGNOSIS — L89611 Pressure ulcer of right heel, stage 1: Secondary | ICD-10-CM

## 2022-04-16 DIAGNOSIS — A419 Sepsis, unspecified organism: Secondary | ICD-10-CM | POA: Diagnosis not present

## 2022-04-16 LAB — BASIC METABOLIC PANEL
Anion gap: 6 (ref 5–15)
BUN: 23 mg/dL (ref 8–23)
CO2: 33 mmol/L — ABNORMAL HIGH (ref 22–32)
Calcium: 7.8 mg/dL — ABNORMAL LOW (ref 8.9–10.3)
Chloride: 102 mmol/L (ref 98–111)
Creatinine, Ser: 1.22 mg/dL — ABNORMAL HIGH (ref 0.44–1.00)
GFR, Estimated: 45 mL/min — ABNORMAL LOW (ref >60)
Glucose, Bld: 128 mg/dL — ABNORMAL HIGH (ref 70–99)
Potassium: 3.2 mmol/L — ABNORMAL LOW (ref 3.5–5.1)
Sodium: 141 mmol/L (ref 135–145)

## 2022-04-16 LAB — CBC WITH DIFFERENTIAL/PLATELET
Abs Immature Granulocytes: 0.07 10*3/uL (ref 0.00–0.07)
Basophils Absolute: 0 10*3/uL (ref 0.0–0.1)
Basophils Relative: 0 %
Eosinophils Absolute: 0.3 10*3/uL (ref 0.0–0.5)
Eosinophils Relative: 7 %
HCT: 24.8 % — ABNORMAL LOW (ref 36.0–46.0)
Hemoglobin: 7.7 g/dL — ABNORMAL LOW (ref 12.0–15.0)
Immature Granulocytes: 2 %
Lymphocytes Relative: 32 %
Lymphs Abs: 1.5 10*3/uL (ref 0.7–4.0)
MCH: 29.1 pg (ref 26.0–34.0)
MCHC: 31 g/dL (ref 30.0–36.0)
MCV: 93.6 fL (ref 80.0–100.0)
Monocytes Absolute: 0.9 10*3/uL (ref 0.1–1.0)
Monocytes Relative: 20 %
Neutro Abs: 1.8 10*3/uL (ref 1.7–7.7)
Neutrophils Relative %: 39 %
Platelets: 375 10*3/uL (ref 150–400)
RBC: 2.65 MIL/uL — ABNORMAL LOW (ref 3.87–5.11)
RDW: 16.5 % — ABNORMAL HIGH (ref 11.5–15.5)
WBC: 4.6 10*3/uL (ref 4.0–10.5)
nRBC: 0 % (ref 0.0–0.2)

## 2022-04-16 LAB — GLUCOSE, CAPILLARY: Glucose-Capillary: 141 mg/dL — ABNORMAL HIGH (ref 70–99)

## 2022-04-16 LAB — PHOSPHORUS: Phosphorus: 2.6 mg/dL (ref 2.5–4.6)

## 2022-04-16 LAB — MAGNESIUM: Magnesium: 1.8 mg/dL (ref 1.7–2.4)

## 2022-04-16 LAB — BRAIN NATRIURETIC PEPTIDE: B Natriuretic Peptide: 366.5 pg/mL — ABNORMAL HIGH (ref 0.0–100.0)

## 2022-04-16 MED ORDER — POTASSIUM CHLORIDE 20 MEQ PO PACK: 40.0000 meq | PACK | Freq: Two times a day (BID) | ORAL | Status: DC

## 2022-04-16 MED ORDER — METOPROLOL TARTRATE 50 MG PO TABS
75.0000 mg | ORAL_TABLET | Freq: Two times a day (BID) | ORAL | Status: DC
  Administered 2022-04-16 – 2022-04-18 (×4): 75 mg via ORAL
  Filled 2022-04-16 (×4): qty 1

## 2022-04-16 MED ORDER — POTASSIUM CHLORIDE 20 MEQ PO PACK
40.0000 meq | PACK | Freq: Two times a day (BID) | ORAL | Status: AC
  Administered 2022-04-16: 40 meq via ORAL
  Filled 2022-04-16: qty 2

## 2022-04-16 NOTE — Progress Notes (Signed)
Pt is sundowning, becoming more confused. Pt pulled off her telemetry leads, as well as her IV port out of her chest. IV team paged, on call J. Olena Heckle aware ?

## 2022-04-16 NOTE — Progress Notes (Signed)
Speech Language Pathology Treatment: Dysphagia  ?Patient Details ?Name: Tieshia Rettinger ?MRN: [id number] ?DOB: [birth year] ?Today's Date: 04/16/2022 ?Time: 3762-8315 ?SLP Time Calculation (min) (ACUTE ONLY): 16 min ? ?Assessment / Plan / Recommendation ?Clinical Impression ? SLP session focused on dysphagia goals - education re: MBS findings, clinical reasoning for precautions.  Son, [name], present and both pt and [name] reviewed MBS flouro loops with SLP comparing her swallow to normal. Son reports pt has trigeminal neuralgia impacting right face - and pt reports left sided primary weakness.  Per son, pt will cough when eating at home, she eats at rapid rate without clearing oral cavity before taking more. Uses dentures usually only if eating hard foods.  Using teach back with moderate cues and return demonstration - advised sitting fully upright, intermittent throat clear and reswallow.  Pt demonstrated "hard expectoration" - "hock" but without ability to expel into oral cavity.  Will initiate RMT to maximize airway protection with swallowing. Son and pt agreeable to plan, RN informed of results of testing. ? ?  ?HPI HPI: Patient is a 76 y.o. female with PMH: HTN, prediabetes, trigeminal neuralgia of right side of face, who presented to the hospital with several days lethargy, confusion, fever, chills. She was found to meet criteria for sepsis with unclear source. 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 CXR showed Increased density in the right upper and left lower lung fields may  suggest atelectasis/pneumonia.  Pt underwent BSE on Monday 4/17 which pt passed easily - Swallow evaluation reordered- therefore MBS advised and order obtained. ?  ?   ?SLP Plan ? Continue with current plan of care ? ?  ?  ?Recommendations for follow up therapy are one component of a multi-disciplinary discharge planning process, led by the  attending physician.  Recommendations may be updated based on patient status, additional functional criteria and insurance authorization. ?  ? ?Recommendations  ?Diet recommendations: Dysphagia 3 (mechanical soft);Thin liquid ?Liquids provided via: Cup;Straw ?Medication Administration: Crushed with puree ?Compensations: Small sips/bites;Slow rate;Other (Comment);Clear throat intermittently ?Postural Changes and/or Swallow Maneuvers: Seated upright 90 degrees;Upright 30-60 min after meal  ?   ?    ?   ? ? ? ? Oral Care Recommendations: Oral care BID ?Follow Up Recommendations: Skilled nursing-short term rehab (<3 hours/day) ?Assistance recommended at discharge: Frequent or constant Supervision/Assistance ?SLP Visit Diagnosis: Dysphagia, pharyngoesophageal phase (R13.14);Dysphagia, oropharyngeal phase (R13.12) ?Plan: Continue with current plan of care ? ? ? ? ?  ?  ?[name], [name] ?Acute Rehab Services ?Office [phone] ?Pager [phone] ? ? ?[name] ? ?04/16/2022, 6:06 PM ?

## 2022-04-16 NOTE — Progress Notes (Addendum)
[facility] ?PROGRESS NOTE ? ? ? ?Progress Note  ?Isabel Gibbs  JSH:[id number] DOB: [birth year] DOA: 04/12/2022 ?PCP: Karleen Hampshire., MD  ? ? ? ?Brief Narrative:  ? ?[name] is an 76 y.o. 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  Hospital course was complicated by intermittent A-fib with RVR started on Lopressor twice a day cardiology was consulted she was found to be fluid overloaded and started on IV Lasix. ? ? ? ?Assessment/Plan:  ? ?Sepsis ([place]) due to UTI and possibly HCAP/acute resp. failure with hypoxia: ?She still requiring 3 L of oxygen to keep saturations greater than 92%. ?He was started empirically on admission on IV vancomycin and cefepime which has been de-escalated to IV Unasyn. ?Tmax of 99. ?Urine culture grew more than 100,000 colonies of coccus faecalis sensitive to ampicillin. ?Cultures remain negative till date. ?Out of bed to chair wean to room air. ? ?New onset A-fib with RVR: ?Which has Vascor greater than 4 started on Lopressor twice a day 2D echo showed an EF of 55%. ?Cardiology was consulted her metoprolol was increased they are more concerned about possible MAT no indication for anticoagulation at this time.  They have signed off ? ?CNS lymphoma: ?Undergoing chemotherapy, by Dr. Mickeal Skinner will need to follow-up as an outpatient. ? 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 ? ?Physical deconditioning: ?Physical therapy and occupational therapy has been consulted, she will probably need to go to skilled nursing facility. ? ?Dysphagia: ?Started on dysphagia 3 diet, speech was consulted, recommended an MBS. ? ?Acute encephalopathy ?Now resolved likely due to infectious etiology. ? ?Hypokalemia: ?Low replete orally. Recheck in am ?  ?Acute diastolic heart  failure: ?Appears euvolemic, cont lasix per cards. ?Strict I and O's. ? ?Pressure injury of skin left heel present on admission: ?RN Pressure Injury Documentation: ?Pressure Injury 04/14/22 Heel Left Stage 1 -  Intact skin with non-blanchable redness of a localized area usually over a bony prominence. (Active)  ?04/14/22 1341  ?Location: Heel  ?Location Orientation: Left  ?Staging: Stage 1 -  Intact skin with non-blanchable redness of a localized area usually over a bony prominence.  ?Wound Description (Comments):   ?Present on Admission:   ?Dressing Type Foam - Lift dressing to assess site every shift 04/15/22 0845  ?   ?Pressure Injury 04/14/22 Heel Right Stage 1 -  Intact skin with non-blanchable redness of a localized area usually over a bony prominence. (Active)  ?04/14/22 1411  ?Location: Heel  ?Location Orientation: Right  ?Staging: Stage 1 -  Intact skin with non-blanchable redness of a localized area usually over a bony prominence.  ?Wound Description (Comments):   ?Present on Admission: Yes  ?Dressing Type Foam - Lift dressing to assess site every shift 04/15/22 2030  ? ? ? ?DVT prophylaxis: lovenox ?Family Communication:none ?Status is: Inpatient ?Remains inpatient appropriate because: UTI ? ? ? ?Code Status:  ? ?  ?Code Status Orders  ?(From admission, onward)  ?  ? ? ?  ? ?  Start     Ordered  ? 04/12/22 2213  Full code  Continuous       ? 04/12/22 2212  ? ?  ?  ? ?  ? ?Code Status History   ? ? Date Active Date Inactive Code Status Order ID Comments User Context  ?  03/31/2022 1115 04/07/2022 2147 Full Code [id number]  Ventura Sellers, MD Inpatient  ? 03/10/2022 1152 03/15/2022 2036 Full Code [id number]  Ventura Sellers, MD Inpatient  ? 03/05/2022 2159 03/08/2022 1844 Full Code [id number]  Chotiner, Yevonne Aline, MD ED  ? 12/31/2021 0636 01/03/2022 1534 Full Code [id number]  [name], MD ED  ? ?  ? ?Advance Directive Documentation   ? ?Flowsheet Row Most Recent Value  ?Type of Advance Directive Healthcare Power  of Attorney  ?Pre-existing out of facility DNR order (yellow form or pink MOST form) --  ?"MOST" Form in Place? --  ? ?  ? ? ? ? ?IV Access:  ? ?Peripheral IV ? ? ?Procedures and diagnostic studies:  ? ?DG CHEST PORT 1 VIEW ? ?Result Date: 04/14/2022 ?CLINICAL DATA:  Hypoxia, atrial fibrillation EXAM: PORTABLE CHEST 1 VIEW COMPARISON:  Chest radiograph from one day prior. FINDINGS: Left rotated chest radiograph. Right internal jugular Port-A-Cath terminates over the cavoatrial junction. Stable cardiomediastinal silhouette with mild cardiomegaly. No pneumothorax. Small left pleural effusion is stable. No significant right pleural effusion. Stable mild left basilar atelectasis. Stable borderline mild pulmonary edema. IMPRESSION: 1. Stable borderline mild congestive heart failure. 2. Stable small left pleural effusion and mild left basilar atelectasis. Electronically Signed   By: Ilona Sorrel M.D.   On: 04/14/2022 19:27  ? ?ECHOCARDIOGRAM COMPLETE ? ?Result Date: 04/14/2022 ?   ECHOCARDIOGRAM REPORT   Patient Name:   Isabel Gibbs Date of Exam: 04/14/2022 Medical Rec #:  [id number]  Height:       67.0 in Accession #:    [id number] Weight:       210.1 lb Date of Birth:  1946/12/24  BSA:          2.065 m? Patient Age:    35 years   BP:           122/77 mmHg Patient Gender: F          HR:           69 bpm. Exam Location:  Inpatient Procedure: 2D Echo, Cardiac Doppler, Color Doppler and Strain Analysis Indications:     AF  History:         Patient has no prior history of Echocardiogram examinations.                  Arrythmias:Atrial Fibrillation; Risk Factors:Hypertension.  Sonographer:     Luisa Hart RDCS Referring Phys:  [id number] [place] Diagnosing Phys: Kirk Ruths McleanMD IMPRESSIONS  1. Left ventricular ejection fraction, by estimation, is 50%. The left ventricle has mildly decreased function. The left ventricle demonstrates global hypokinesis. There is mild left ventricular hypertrophy. Left ventricular diastolic  parameters are consistent with Grade I diastolic dysfunction (impaired relaxation).  2. Right ventricular systolic function is normal. The right ventricular size is normal. Tricuspid regurgitation signal is inadequate for assessing PA pressure.  3. Left atrial size was mildly dilated.  4. The mitral valve is normal in structure. No evidence of mitral valve regurgitation. No evidence of mitral stenosis.  5. The aortic valve is tricuspid. Aortic valve regurgitation is not visualized. Aortic valve sclerosis/calcification is present, without any evidence of aortic stenosis.  6. The inferior vena cava is normal in size with greater than 50% respiratory variability, suggesting right atrial pressure of 3 mmHg.  7. Technically difficult study with poor acoustic windows. FINDINGS  Left Ventricle: Left ventricular ejection fraction, by estimation, is 50%. The left ventricle has mildly decreased function. The  left ventricle demonstrates global hypokinesis. The left ventricular internal cavity size was normal in size. There is mild left ventricular hypertrophy. Left ventricular diastolic parameters are consistent with Grade I diastolic dysfunction (impaired relaxation). Right Ventricle: The right ventricular size is normal. No increase in right ventricular wall thickness. Right ventricular systolic function is normal. Tricuspid regurgitation signal is inadequate for assessing PA pressure. The tricuspid regurgitant velocity is 1.57 m/s, and with an assumed right atrial pressure of 3 mmHg, the estimated right ventricular systolic pressure is 15.1 mmHg. Left Atrium: Left atrial size was mildly dilated. Right Atrium: Right atrial size was normal in size. Pericardium: There is no evidence of pericardial effusion. Mitral Valve: The mitral valve is normal in structure. There is mild thickening of the mitral valve leaflet(s). Mild mitral annular calcification. No evidence of mitral valve regurgitation. No evidence of mitral valve  stenosis. Tricuspid Valve: The tricuspid valve is normal in structure. Tricuspid valve regurgitation is trivial. Aortic Valve: The aortic valve is tricuspid. Aortic valve regurgitation is not visualized. Aortic valve sclerosis/

## 2022-04-16 NOTE — Progress Notes (Signed)
Modified Barium Swallow Progress Note ? ?Patient Details  ?Name: Isabel Gibbs ?MRN: [id number] ?Date of Birth: 10-20-1946 ? ?Today's Date: 04/16/2022 ? ?Modified Barium Swallow completed.  Full report located under Chart Review in the Imaging Section. ? ?Brief recommendations include the following: ? ?Clinical Impression ? Patient presents with mild sensorimotor oropharyngeal and mild obstructive cervical esophageal dysphagia.   She demonstrates decreased oral propulsion strength allowing separation of tablet from liquids. Tablet then spilled from oral cavity to vallecular space with uncertain pt awareness.  Further liquid swallows did not transit tablet into esophagus - pudding bolus effective.  Trial of small whole piece of graham cracker (to mimic pill) swallowed with liquid than lodged at pyriform sinus. Patient appears with large bony protrusion that impinges into cervical esophagus- likely contributing to retention of solids. Head turn to the right did not improve pharyngeal/cervical esophageal clearance.  Recommend medications with puree - crushed if large and not contraindicated. Pt also presents with delayed pharyngeal swallow over epiglottis at times allowing inconsistent laryngeal penetration of thin/ultrathin. Chin tuck posture did not prevent and was difficult for pt to perform.   Variable pharyngeal retention of liquids noted without sensation.  Pt did not cough during entire MBS - despite trace penetration to vocal cords.  recommend continue dys3/thin with strict precautions including intermittent throat clear after liquid swallows. SLP to follow up for dysphagia management, education, strengthening exercises. Using teach back, pt educated to findings/recommendations. Will follow clinically also for po tolerance, indication for acute diet modification for airway protection while pt is ill.   ?  ?Swallow Evaluation Recommendations ? ?   ? ? SLP Diet Recommendations: Dysphagia 3 (Mech soft) solids;Thin  liquid ? ? Liquid Administration via: Cup;Straw ? ? Medication Administration: Whole meds with puree (crush if large and not contraindicated) ? ? Supervision: Patient able to self feed ? ? Compensations: Small sips/bites;Slow rate;Other (Comment) (intermittent throat clear and reswallow) ? ?   ? ? Oral Care Recommendations: Oral care QID ? ?   ? ?[name], [name] ?Acute Rehab Services ?Office [phone] ?Pager [phone] ? ? ?[name] ?04/16/2022,3:51 PM ?

## 2022-04-16 NOTE — Evaluation (Addendum)
SLP Cancellation Note ? ?Patient Details ?Name: Isabel Gibbs ?MRN: [id number] ?DOB: 08-05-1946 ? ? ?Cancelled treatment:       Reason Eval/Treat Not Completed: Other (comment) (Received repeat order for swallow eval, pt seen by SLP 2 days prior, concern for aspiration continues, recommend MBS to allow instrumental eval, Spoke to MD and he agreed, planned tentatively for noon today.)  Pt informed and agreeable.   ? ? ? Time adjusted on MBS to approx 1400 - rn informed.  ? ?[name], MS [facility] SLP ?Acute Rehab Services ?Office [phone] ?Pager [phone] ? ?[name] ?04/16/2022, 9:47 AM ? ? ? ?

## 2022-04-17 DIAGNOSIS — I471 Supraventricular tachycardia: Secondary | ICD-10-CM

## 2022-04-17 DIAGNOSIS — G9340 Encephalopathy, unspecified: Secondary | ICD-10-CM

## 2022-04-17 LAB — MAGNESIUM: Magnesium: 2 mg/dL (ref 1.7–2.4)

## 2022-04-17 LAB — BRAIN NATRIURETIC PEPTIDE: B Natriuretic Peptide: 626.1 pg/mL — ABNORMAL HIGH (ref 0.0–100.0)

## 2022-04-17 MED ORDER — POTASSIUM CHLORIDE 20 MEQ PO PACK
40.0000 meq | PACK | Freq: Three times a day (TID) | ORAL | Status: AC
Start: 2022-04-17 — End: 2022-04-17
  Administered 2022-04-17 (×3): 40 meq via ORAL
  Filled 2022-04-17 (×3): qty 2

## 2022-04-17 MED ORDER — AMOXICILLIN-POT CLAVULANATE 875-125 MG PO TABS
1.0000 | ORAL_TABLET | Freq: Two times a day (BID) | ORAL | Status: DC
  Administered 2022-04-17 – 2022-04-18 (×3): 1 via ORAL
  Filled 2022-04-17 (×3): qty 1

## 2022-04-17 MED ORDER — FUROSEMIDE 40 MG PO TABS
40.0000 mg | ORAL_TABLET | Freq: Every day | ORAL | Status: DC
  Administered 2022-04-18: 40 mg via ORAL
  Filled 2022-04-17: qty 1

## 2022-04-17 MED ORDER — MAGNESIUM OXIDE -MG SUPPLEMENT 400 (240 MG) MG PO TABS
400.0000 mg | ORAL_TABLET | Freq: Two times a day (BID) | ORAL | Status: AC
Start: 2022-04-17 — End: 2022-04-17
  Administered 2022-04-17 (×2): 400 mg via ORAL
  Filled 2022-04-17 (×2): qty 1

## 2022-04-17 MED ORDER — POTASSIUM CHLORIDE 20 MEQ PO PACK: 40.0000 meq | PACK | Freq: Two times a day (BID) | ORAL | Status: DC

## 2022-04-17 MED ORDER — POTASSIUM CHLORIDE 10 MEQ/100ML IV SOLN
10.0000 meq | INTRAVENOUS | Status: AC
  Administered 2022-04-17 (×5): 10 meq via INTRAVENOUS
  Filled 2022-04-17 (×5): qty 100

## 2022-04-17 NOTE — TOC Progression Note (Signed)
Transition of Care (TOC) - Progression Note  ? ? ?Patient Details  ?Name: Isabel Gibbs ?MRN: [id number] ?Date of Birth: January 22, 1946 ? ?Transition of Care (TOC) CM/SW Contact  ?Purcell Mouton, RN ?Phone Number: ?04/17/2022, 5:45 PM ? ?Clinical Narrative:    ?Insurance auth started with [facility] for [employer].  ? ? ?Expected Discharge Plan: [place] ?Barriers to Discharge: No Barriers Identified ? ?Expected Discharge Plan and Services ?Expected Discharge Plan: [place] ?  ?  ?Post Acute Care Choice: Home Health, [place] ?Living arrangements for the past 2 months: [place] ?                ?  ?  ?  ?  ?  ?  ?  ?  ?  ?  ? ? ?Social Determinants of Health (SDOH) Interventions ?  ? ?Readmission Risk Interventions ? ?  03/11/2022  ?  1:36 PM  ?Readmission Risk Prevention Plan  ?Transportation Screening Complete  ?PCP or Specialist Appt within 5-7 Days Complete  ?Home Care Screening Complete  ?Medication Review (RN CM) Complete  ? ? ?

## 2022-04-17 NOTE — Progress Notes (Addendum)
[facility] ?PROGRESS NOTE ? ? ? ?Progress Note  ?Isabel Gibbs  JKD:[id number] DOB: 06-28-1946 DOA: 04/12/2022 ?PCP: Karleen Hampshire., MD  ? ? ? ?Brief Narrative:  ? ?Isabel Gibbs is an 76 y.o. 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  Hospital course was complicated by intermittent A-fib with RVR started on Lopressor twice a day cardiology was consulted she was found to be fluid overloaded and started on IV Lasix. ? ? ? ?Assessment/Plan:  ? ?Sepsis ([place]) due to UTI and possibly HCAP/acute resp. failure with hypoxia: ?Patient is still requiring 2 L of oxygen to keep saturations greater than 90%. ?Transition IV Unasyn to oral Augmentin. ?Urine culture grew more than 100,000 colonies Enterococcus faecalis sensitive to ampicillin. ?Blood cultures remain negative till date. ?Out of bed to chair wean to room air. ?Continue to work with physical therapy. ? ?Multifocal atrial tachycardia: ?Which has Vascor greater than 4 started on Lopressor twice a day 2D echo showed an EF of 55%. ?Cardiology was consulted her metoprolol was increased, this most likely MAT. ?No anticoagulation indicated at this time. ? ?CNS lymphoma: ?Undergoing chemotherapy, by Dr. Mickeal Skinner will need to follow-up as an outpatient. ? 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 ? ?Physical deconditioning: ?Physical therapy and occupational therapy has been consulted, she will probably need to go to skilled nursing facility. ? ?Dysphagia: ?Started on dysphagia 3 diet, speech was consulted, recommended an MBS. ? ?Acute encephalopathy ?Now resolved likely due to infectious etiology. ? ?Hypokalemia: ?Potassium is worse today replete orally recheck in the morning. ?  ?Acute diastolic heart failure: ?Appears euvolemic, continue Lasix  orally. ?Strict I and O's. ? ?Pressure injury of skin left heel present on admission: ?RN Pressure Injury Documentation: ?Pressure Injury 04/14/22 Heel Left Stage 1 -  Intact skin with non-blanchable redness of a localized area usually over a bony prominence. (Active)  ?04/14/22 1341  ?Location: Heel  ?Location Orientation: Left  ?Staging: Stage 1 -  Intact skin with non-blanchable redness of a localized area usually over a bony prominence.  ?Wound Description (Comments):   ?Present on Admission:   ?Dressing Type Foam - Lift dressing to assess site every shift 04/16/22 0800  ?   ?Pressure Injury 04/14/22 Heel Right Stage 1 -  Intact skin with non-blanchable redness of a localized area usually over a bony prominence. (Active)  ?04/14/22 1411  ?Location: Heel  ?Location Orientation: Right  ?Staging: Stage 1 -  Intact skin with non-blanchable redness of a localized area usually over a bony prominence.  ?Wound Description (Comments):   ?Present on Admission: Yes  ?Dressing Type Foam - Lift dressing to assess site every shift 04/16/22 0800  ? ? ? ?DVT prophylaxis: lovenox ?Family Communication:none ?Status is: Inpatient ?Remains inpatient appropriate because: UTI ? ? ? ?Code Status:  ? ?  ?Code Status Orders  ?(From admission, onward)  ?  ? ? ?  ? ?  Start     Ordered  ? 04/12/22 2213  Full code  Continuous       ? 04/12/22 2212  ? ?  ?  ? ?  ? ?Code Status History   ? ? Date Active Date Inactive Code Status Order ID Comments User Context  ? 03/31/2022 1115 04/07/2022 2147 Full Code [id number]  Ventura Sellers, MD Inpatient  ?  03/10/2022 1152 03/15/2022 2036 Full Code [id number]  Ventura Sellers, MD Inpatient  ? 03/05/2022 2159 03/08/2022 1844 Full Code [id number]  Chotiner, Yevonne Aline, MD ED  ? 12/31/2021 0636 01/03/2022 1534 Full Code [id number]  [name], MD ED  ? ?  ? ?Advance Directive Documentation   ? ?Flowsheet Row Most Recent Value  ?Type of Advance Directive Healthcare Power of Attorney  ?Pre-existing out of facility  DNR order (yellow form or pink MOST form) --  ?"MOST" Form in Place? --  ? ?  ? ? ? ? ?IV Access:  ? ?Peripheral IV ? ? ?Procedures and diagnostic studies:  ? ?DG Swallowing Func-Speech Pathology ? ?Result Date: 04/16/2022 ?Table formatting from the original result was not included. Objective Swallowing Evaluation: Type of Study: MBS-Modified Barium Swallow Study  Patient Details Name: Isabel Gibbs MRN: [id number] Date of Birth: April 24, 1946 Today's Date: 04/16/2022 Time: SLP Start Time (ACUTE ONLY): 1400 -SLP Stop Time (ACUTE ONLY): 1435 SLP Time Calculation (min) (ACUTE ONLY): 35 min Past Medical History: Past Medical History: Diagnosis Date  Hypertension   Prediabetes   Trigeminal neuralgia of right side of face  Past Surgical History: Past Surgical History: Procedure Laterality Date  APPLICATION OF CRANIAL NAVIGATION Left 05/31/8465  Procedure: APPLICATION OF CRANIAL NAVIGATION;  Surgeon: Judith Part, MD;  Location: [facility];  Service: Neurosurgery;  Laterality: Left;  CEREBRAL MICROVASCULAR DECOMPRESSION Right 08/02/2018  [facility]  FRAMELESS  BIOPSY WITH BRAINLAB Left 01/02/2022  Procedure: LEFT BRAIN  BIOPSY WITH Lucky Rathke;  Surgeon: Judith Part, MD;  Location: [facility];  Service: Neurosurgery;  Laterality: Left;  IR IMAGING GUIDED PORT INSERTION  03/04/2022  RADIOLOGY WITH ANESTHESIA N/A 01/01/2022  Procedure: MRI of Brain with and without contrast;  Surgeon: Radiologist, Medication, MD;  Location: [facility];  Service: Radiology;  Laterality: N/A; HPI: Patient is a 76 y.o. female with PMH: HTN, prediabetes, trigeminal neuralgia of right side of face, who presented to the hospital with several days lethargy, confusion, fever, chills. She was found to meet criteria for sepsis with unclear source. 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 CXR showed Increased density in the right upper and left lower lung fields may   suggest atelectasis/pneumonia.  Pt underwent BSE on Monday 4/17 which pt passed easily - Swallow evaluation reordered- therefore MBS advised and order obtained.  Subjective: pleasant, alert  Recommendations for follow up therapy are one component of a multi-disciplinary discharge planning process, led by the attending physician.  Recommendations may be updated based on patient status, additional functional criteria and insurance authorization. Assessment / Plan / Recommendation   04/16/2022   3:48 PM Clinical Impressions Clinical Impression Impression             Patient presents with mild sensorimotor oropharyngeal and mild obstructive cervical esophageal dysphagia.   She demonstrates decreased oral propulsion strength allowing separation of tablet from liquids. Tablet then spilled from oral cavity to vallecular space with uncertain pt awareness.  Further liquid swallows did not transit tablet into esophagus - pudding bolus effective.  Trial of small whole piece of graham cracker (to mimic pill) swallowed with liquid than lodged at pyriform sinus. Patient appears with large bony protrusion that impinges into cervical esophagus- likely contributing to retention of solids. Head turn to the right did not improve pharyngeal/cervical esophageal clearance.  Recommend medications with puree - crushed if large and not contraindicated. Pt also presents with delayed  pharyngeal swallow over epiglottis at times allowing inconsistent laryngeal penetration of thin/ultrathin. Chin tuck posture did not prevent and was difficult for pt to perform.   Variable pharyngeal retention of liquids noted without sensation.  Pt did not cough during entire MBS - despite trace penetration to vocal cords.  recommend continue dys3/thin with strict precautions including intermittent throat clear after liquid swallows. SLP to follow up for dysphagia management, education, strengthening exercises. Using teach back, pt educated to  findings/recommendations. Will follow clinically also for po tolerance, indication for acute diet modification for airway protection while pt is ill.   SLP Visit Diagnosis Dysphagia, pharyngoesophageal phase (R13.14);Dysphagia, or

## 2022-04-17 NOTE — Care Management Important Message (Signed)
Important Message ? ?Patient Details IM Letter given to the Patient. ?Name: Isabel Gibbs ?MRN: [id number] ?Date of Birth: 13-Jun-1946 ? ? ?Medicare Important Message Given:  Yes ? ? ? ? ?Kerin Salen ?04/17/2022, 12:12 PM ?

## 2022-04-17 NOTE — Progress Notes (Signed)
Physical Therapy Treatment ?Patient Details ?Name: Isabel Gibbs ?MRN: [id number] ?DOB: [birth year] ?Today's Date: 04/17/2022 ? ? ?History of Present Illness Patient is a 76 y.o. female with PMH: HTN, prediabetes, trigeminal neuralgia of right side of face, who presented to the hospital with several days lethargy, confusion, fever, chills. She was found to meet criteria for sepsis with unclear source. 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 CXR showed Increased density in the right upper and left lower lung fields may  suggest atelectasis/pneumonia. ? ?  ?PT Comments  ? ? Pt continues to require +2 assist for all mobility. Sat EOB for several minutes working on static and dynamic sitting balance (anterior weight-shifting, self correction to midline). Attempted sit to stand x 3 with STEDY-pt only able to partially stand.  ? ?*O2 low when PT arrived (RN in room)-RN increased O2 to 3L. At end of PT session, O2 still hovering around 87%-increased O2 to 4L (with buddy RN approval)-sats 90% on 4L end of PT session.* (made RN aware thru secure chat).  ? ?   ?Recommendations for follow up therapy are one component of a multi-disciplinary discharge planning process, led by the attending physician.  Recommendations may be updated based on patient status, additional functional criteria and insurance authorization. ? ?Follow Up Recommendations ? Skilled nursing-short term rehab (<3 hours/day) ?  ?  ?Assistance Recommended at Discharge Frequent or constant Supervision/Assistance  ?Patient can return home with the following Two people to help with walking and/or transfers;A lot of help with bathing/dressing/bathroom;Assistance with cooking/housework;Help with stairs or ramp for entrance;Assist for transportation;Assistance with feeding ?  ?Equipment Recommendations ? None recommended by PT  ?  ?Recommendations for Other Services    ? ? ?  ?Precautions / Restrictions Precautions ?Precautions: Fall ?Precaution Comments: wound on buttocks, incontinent ?Restrictions ?Weight Bearing Restrictions: No  ?  ? ?Mobility ? Bed Mobility ?Overal bed mobility: Needs Assistance ?Bed Mobility: Rolling, Supine to Sit, Sit to Supine ?Rolling: Max assist ?  ?Supine to sit: Max assist, +2 for physical assistance, +2 for safety/equipment, HOB elevated ?Sit to supine: Max assist, +2 for physical assistance, +2 for safety/equipment, HOB elevated ?  ?General bed mobility comments: Assist for trunk and bil LEs. Utilized bedpad to assist with positioning. Increased time and effort for pt. Worked on static and dynamic sitting balance. Pt fatigues eaily. ?  ? ?Transfers ?Overall transfer level: Needs assistance ?  ?Transfers: Sit to/from Stand ?Sit to Stand: Total assist, +2 physical assistance, +2 safety/equipment, From elevated surface ?  ?  ?  ?  ?  ?General transfer comment: Sit to stand x 3 with STEDY-pt only able to partially stand each time. Heavy +2 assist. Cues for safety, technqiue. Assist to weighshift anteriorly, properly positon feet on platform, power up, control descent back to bed. ?Transfer via Lift Equipment: Stedy ? ?Ambulation/Gait ?  ?  ?  ?  ?  ?  ?  ?  ? ? ?Stairs ?  ?  ?  ?  ?  ? ? ?Wheelchair Mobility ?  ? ?Modified Rankin (Stroke Patients Only) ?  ? ? ?  ?Balance Overall balance assessment: Needs assistance ?Sitting-balance support: Bilateral upper extremity supported, Feet supported ?Sitting balance-Leahy Scale: Fair ?Sitting balance - Comments: Min guard-Min A for sitting balance. Pt intermittently had increased posterior leaning-cues for self correction-assist needed sometimes. ?Postural control: Posterior lean ?  ?Standing balance-Leahy Scale: Zero ?  ?  ?  ?  ?  ?  ?  ?  ?  ?  ?  ?  ?  ? ?  ?  Cognition Arousal/Alertness: Awake/alert ?Behavior During Therapy: [facility] for tasks assessed/performed ?Overall Cognitive Status: No family/caregiver  present to determine baseline cognitive functioning ?Area of Impairment: Memory, Orientation ?  ?  ?  ?  ?  ?  ?  ?  ?Orientation Level: Time, Situation ?  ?  ?  ?  ?  ?  ?General Comments: Patient is following 1 step directions appropriately ?  ?  ? ?  ?Exercises General Exercises - Lower Extremity ?Long Arc Quad: AROM, Both, 5 reps, Seated ? ?  ?General Comments   ?  ?  ? ?Pertinent Vitals/Pain Pain Assessment ?Pain Assessment: Faces ?Faces Pain Scale: Hurts little more ?Pain Location: shoulders with reaching ?Pain Descriptors / Indicators: Discomfort, Sore ?Pain Intervention(s): Repositioned, Monitored during session  ? ? ?Home Living   ?  ?  ?  ?  ?  ?  ?  ?  ?  ?   ?  ?Prior Function    ?  ?  ?   ? ?PT Goals (current goals can now be found in the care plan section) Progress towards PT goals: Progressing toward goals ? ?  ?Frequency ? ? ? Min 2X/week ? ? ? ?  ?PT Plan Current plan remains appropriate  ? ? ?Co-evaluation   ?  ?  ?  ?  ? ?  ?AM-PAC PT "6 Clicks" Mobility   ?Outcome Measure ? Help needed turning from your back to your side while in a flat bed without using bedrails?: A Lot ?Help needed moving from lying on your back to sitting on the side of a flat bed without using bedrails?: Total ?Help needed moving to and from a bed to a chair (including a wheelchair)?: Total ?Help needed standing up from a chair using your arms (e.g., wheelchair or bedside chair)?: Total ?Help needed to walk in hospital room?: Total ?Help needed climbing 3-5 steps with a railing? : Total ?6 Click Score: 7 ? ?  ?End of Session Equipment Utilized During Treatment: Oxygen ?Activity Tolerance: Patient tolerated treatment well;Patient limited by fatigue ?Patient left: in bed;with call bell/phone within reach;with bed alarm set;with family/visitor present ?  ?PT Visit Diagnosis: Difficulty in walking, not elsewhere classified (R26.2);Other abnormalities of gait and mobility (R26.89);Muscle weakness (generalized) (M62.81) ?   ? ? ?Time: 2094-7096 ?PT Time Calculation (min) (ACUTE ONLY): 34 min ? ?Charges:  $Therapeutic Activity: 23-37 mins          ?          ? ? ? ? ?Doreatha Massed, PT ?Acute Rehabilitation  ?Office: [phone] ?Pager: [phone] ? ?  ? ?

## 2022-04-18 LAB — CULTURE, BLOOD (ROUTINE X 2)
Culture: NO GROWTH
Culture: NO GROWTH
Special Requests: ADEQUATE

## 2022-04-18 LAB — BASIC METABOLIC PANEL
Anion gap: 4 — ABNORMAL LOW (ref 5–15)
BUN: 16 mg/dL (ref 8–23)
CO2: 33 mmol/L — ABNORMAL HIGH (ref 22–32)
Calcium: 7.9 mg/dL — ABNORMAL LOW (ref 8.9–10.3)
Chloride: 104 mmol/L (ref 98–111)
Creatinine, Ser: 1.04 mg/dL — ABNORMAL HIGH (ref 0.44–1.00)
GFR, Estimated: 55 mL/min — ABNORMAL LOW (ref >60)
Glucose, Bld: 142 mg/dL — ABNORMAL HIGH (ref 70–99)
Potassium: 4.4 mmol/L (ref 3.5–5.1)
Sodium: 141 mmol/L (ref 135–145)

## 2022-04-18 MED ORDER — METOPROLOL TARTRATE 75 MG PO TABS: 75.0000 mg | ORAL_TABLET | Freq: Two times a day (BID) | ORAL | Status: DC

## 2022-04-18 MED ORDER — HEPARIN SOD (PORK) LOCK FLUSH 100 UNIT/ML IV SOLN
500.0000 [IU] | INTRAVENOUS | Status: AC | PRN
  Administered 2022-04-18: 500 [IU]
  Filled 2022-04-18: qty 5

## 2022-04-18 MED ORDER — AMOXICILLIN-POT CLAVULANATE 875-125 MG PO TABS: 1.0000 | ORAL_TABLET | Freq: Two times a day (BID) | ORAL | 0 refills | Status: AC

## 2022-04-18 NOTE — Progress Notes (Signed)
Report given to [facility] [phone]. ?

## 2022-04-18 NOTE — TOC Progression Note (Signed)
Transition of Care (TOC) - Progression Note  ? ? ?Patient Details  ?Name: Isabel Gibbs ?MRN: [id number] ?Date of Birth: 12-May-1946 ? ?Transition of Care (TOC) CM/SW Contact  ?Purcell Mouton, RN ?Phone Number: ?04/18/2022, 1:36 PM ? ?Clinical Narrative:    ?[facility] was called for transportation. RN and pt's son Isabel Gibbs are aware.  ? ? ?Expected Discharge Plan: [place] ?Barriers to Discharge: No Barriers Identified ? ?Expected Discharge Plan and Services ?Expected Discharge Plan: [place] ?  ?  ?Post Acute Care Choice: Home Health, [place] ?Living arrangements for the past 2 months: [place] ?Expected Discharge Date: 04/18/22               ?  ?  ?  ?  ?  ?  ?  ?  ?  ?  ? ? ?Social Determinants of Health (SDOH) Interventions ?  ? ?Readmission Risk Interventions ? ?  03/11/2022  ?  1:36 PM  ?Readmission Risk Prevention Plan  ?Transportation Screening Complete  ?PCP or Specialist Appt within 5-7 Days Complete  ?Home Care Screening Complete  ?Medication Review (RN CM) Complete  ? ? ?

## 2022-04-18 NOTE — Progress Notes (Signed)
Speech Language Pathology Treatment: Dysphagia  ?Patient Details ?Name: Isabel Gibbs ?MRN: [id number] ?DOB: [birth year] ?Today's Date: 04/18/2022 ?Time: 1145-1200 ?SLP Time Calculation (min) (ACUTE ONLY): 15 min ? ?Assessment / Plan / Recommendation ?Clinical Impression ? Patient seen to address dysphagia goals including assuring po tolerance - meal arrived after completion of execise session -  Therefore SLP returned and completed second session with pt - encouraging implementation of swallow precautions - Pt observed with her lunch meal - mac n cheese, tilapia, brocolli, tea - Pt ablel to easily feed herself with proper positioning.   She demonstrates good tolerance without indication of aspiration with everything except hard chopped brocolli.  Poor mastication suspect *given pt did not have dentures in* followed by cough post-swallow. She required encouragment to cough strongly in attempts to clear.    Advised her against eating the remainder of the brocolli - to which she stated "I don't like it anyway: and she confirmed she sensed it tried to "go the wrong way".  Wet voice noted before and during meal  - Highly recommend pt have follow up SLP for dysphagia managment at the facility - as not dc pending.  Pt benefited from min cues for aspiration precaution compliance and decreased  awareness to secretion retention in larynx benefiting from moderate verbal cues/auditory cues to improve awareness.  Thanks for allowing me to help with this pt's care plan. ? ?  ?HPI HPI: Patient is a 76 y.o. female with PMH: HTN, prediabetes, trigeminal neuralgia of right side of face, who presented to the hospital with several days lethargy, confusion, fever, chills. She was found to meet criteria for sepsis with unclear source. 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 CXR showed Increased density in the right upper and left lower  lung fields may  suggest atelectasis/pneumonia.  Pt underwent BSE on Monday 4/17 which pt passed easily - Swallow evaluation reordered- therefore MBS advised and order obtained. ?  ?   ?SLP Plan ? Continue with current plan of care ? ?  ?  ?Recommendations for follow up therapy are one component of a multi-disciplinary discharge planning process, led by the attending physician.  Recommendations may be updated based on patient status, additional functional criteria and insurance authorization. ?  ? ?Recommendations  ?Diet recommendations: Dysphagia 3 (mechanical soft);Thin liquid ?Liquids provided via: Cup;Straw ?Medication Administration: Crushed with puree ?Compensations: Small sips/bites;Slow rate;Other (Comment);Clear throat intermittently ?Postural Changes and/or Swallow Maneuvers: Seated upright 90 degrees;Upright 30-60 min after meal  ?   ?    ?   ? ? ? ? Oral Care Recommendations: Oral care BID ?Follow Up Recommendations: Skilled nursing-short term rehab (<3 hours/day) ?Assistance recommended at discharge: Frequent or constant Supervision/Assistance ?SLP Visit Diagnosis: Dysphagia, pharyngoesophageal phase (R13.14);Dysphagia, oropharyngeal phase (R13.12) ?Plan: Continue with current plan of care ? ? ? ? ?  ?  ? ?Isabel Lime, [name] ?Acute Rehab Services ?Office [phone] ?Pager [phone] ? ?Isabel Gibbs ? ?04/18/2022, 12:33 PM ?

## 2022-04-18 NOTE — Progress Notes (Addendum)
Speech Language Pathology Treatment: Dysphagia  ?Patient Details ?Name: Isabel Gibbs ?MRN: [id number] ?DOB: 11/21/1931 ?Today's Date: 04/18/2022 ?Time: 8469-6295 ?SLP Time Calculation (min) (ACUTE ONLY): 24 min ?  ?Assessment / Plan / Recommendation ?Clinical Impression ?  Pt seen to address dysphagia goals including initiating RMT to improve laryngeal elevation/closure for airway protection wiht po intake.   Pt xerostomic thus she was provided with water via straw.  No indication of aspiration - voice was gurly/wet at baseline concerning for aspiration of secretions *and without awareness.  Cued cough/throat clear and re-swallow effective to elminiate temporarily.  Provided verbal, visual cues re: importance of oral care due to suspected secretion aspiration. Provided pt with diagram to show her vocal cords at upper trachea - for which she reported understanding.  RMT initiated with Hardin Negus Respironcs Positive Expiratory Pressure device set clinically at 5 cmH20 pressure.  Pt intially required total cues to perform correctly - fading to min cues by 10 trial. Pt admits to becoming tired during exercises but denies pain, dyspnea or dizziness.  Using teach back and written instructions, pt able to verbalize precautions/indications.  Work level reported to be 7/10 - and thus advise this is desired level across 10 reps.  10 reps TID advised - pt agreeable. ?  ?   ?HPI Patient is a 76 y.o. female with PMH: HTN, prediabetes, trigeminal neuralgia of right side of face, who presented to the hospital with several days lethargy, confusion, fever, chills. She was found to meet criteria for sepsis with unclear source. 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 CXR showed Increased density in the right upper and left lower lung fields may  suggest atelectasis/pneumonia.  Pt underwent BSE on Monday 4/17 which pt passed easily  - Swallow evaluation reordered- therefore MBS advised and order obtained. ?   ?     ?SLP Plan ?  Continue with current plan of care ?  ?   ?Recommendations for follow up therapy are one component of a multi-disciplinary discharge planning process, led by the attending physician.  Recommendations may be updated based on patient status, additional functional criteria and insurance authorization. ?  ?  ?Recommendations   ?Diet recommendations: Dysphagia 3 (mechanical soft);Thin liquid ?Liquids provided via: Cup;Straw ?Medication Administration: Crushed with puree ?Supervision: Patient able to self feed ?Compensations: Slow rate;Small sips/bites;Other (Comment);Clear throat intermittently ?Postural Changes and/or Swallow Maneuvers: Seated upright 90 degrees;Upright 30-60 min after meal  ?     ?    ?     ?  ?  ?  ?  Oral Care Recommendations: Oral care QID ?Follow Up Recommendations: Skilled nursing-short term rehab (<3 hours/day) ?Assistance recommended at discharge: Frequent or constant Supervision/Assistance ?SLP Visit Diagnosis: Dysphagia, oral phase (R13.11);Dysphagia, unspecified (R13.10) ?Plan: Continue with current plan of care ?  ?  ?  ?  ?   ?  ?[name], [name] ?Acute Rehab Services ?Office [phone] ?Pager [phone] ?  ?  ?Isabel Gibbs ?  ?04/18/2022, 12:23 PM  ?  ?  ?  ? ?

## 2022-04-18 NOTE — TOC Progression Note (Signed)
Transition of Care (TOC) - Progression Note  ? ? ?Patient Details  ?Name: Isabel Gibbs ?MRN: [id number] ?Date of Birth: 08-22-1946 ? ?Transition of Care (TOC) CM/SW Contact  ?Purcell Mouton, RN ?Phone Number: ?04/18/2022, 9:37 AM ? ?Clinical Narrative:    ? ?A call was made to check on auth for SNF. Pt still in review with insurance co for SNF auth.  ? ?Expected Discharge Plan: [place] ?Barriers to Discharge: No Barriers Identified ? ?Expected Discharge Plan and Services ?Expected Discharge Plan: [place] ?  ?  ?Post Acute Care Choice: Home Health, [place] ?Living arrangements for the past 2 months: [place] ?Expected Discharge Date: 04/18/22               ?  ?  ?  ?  ?  ?  ?  ?  ?  ?  ? ? ?Social Determinants of Health (SDOH) Interventions ?  ? ?Readmission Risk Interventions ? ?  03/11/2022  ?  1:36 PM  ?Readmission Risk Prevention Plan  ?Transportation Screening Complete  ?PCP or Specialist Appt within 5-7 Days Complete  ?Home Care Screening Complete  ?Medication Review (RN CM) Complete  ? ? ?

## 2022-04-18 NOTE — Discharge Summary (Signed)
Physician Discharge Summary  ?Isabel Gibbs PPI:[id number] DOB: 21-Oct-1946 DOA: 04/12/2022 ? ?PCP: Karleen Hampshire., MD ? ?Admit date: 04/12/2022 ?Discharge date: 04/18/2022 ? ?Admitted From: Home ?Disposition:  Home ? ?Recommendations for Outpatient Follow-up:  ?Follow up with PCP in 1-2 weeks, check her weight if her weight starts going up again started on low-dose of Lasix. ?Please obtain BMP/CBC in one week ? ? ?Home Health:No ?Equipment/Devices:None ?Discharge Condition:Stable ?CODE STATUS:Full ?Diet recommendation: Dysphagia 3 ? ?Brief/Interim Summary: ?76 y.o. 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  Hospital course was complicated by intermittent A-fib with RVR started on Lopressor twice a day cardiology was consulted she was found to be fluid overloaded and started on IV Lasix. ? ?Discharge Diagnoses:  ?Principal Problem: ?  Sepsis ([place]) ?Active Problems: ?  UTI (urinary tract infection) ?  HCAP (healthcare-associated pneumonia) ?  Acute encephalopathy ?  CNS lymphoma ([place]) ?  Hypertension ?  Prediabetes ?  Multifocal atrial tachycardia ([facility]) ?  Pressure injury of skin ? ?Sepsis due to UTI and possible healthcare associated pneumonia/acute respiratory failure with hypoxia: ?She was placed on oxygen and started on Rocephin and azithromycin swallowing evaluation was done along with a modified barium swallow they recommended a dysphagia 2 diet as she is moderate risk for aspiration. ?She was transitioned to oral Augmentin if she continue as an outpatient. ?Blood cultures remain negative till date. ?Physical therapy evaluated the patient they recommended skilled nursing facility temporarily. ? ?Multifocal tachycardia: ?Mali Vascor greater than 4 cardiology was consulted they agreed it was multifocal  atrial tachycardia. ?He was placed on Lopressor and heart rate has been controlled. ?We will continue metoprolol as an outpatient no anticoagulation indicated at this time follow-up with cardiology in 2 to 4 weeks. ? ?CNS lymphoma: ?Undergoing chemotherapy we will follow-up with Dr. Mickeal Skinner as an outpatient. ? 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 ? ?Physical deconditioning: ?Physical therapy and Occupational Therapy evaluated the patient, she will go to skilled nursing facility temporarily. ? ?Dysphagia: ?Speech was consulted modified barium swallow was done and they recommended a dysphagia 3 diet. ? ?Acute metabolic encephalopathy: ?Likely due to infectious etiology now resolved. ? ?Hypokalemia: ?Replete orally now resolved. ? ?Acute diastolic heart failure: ?She was placed on Lasix low-dose she diuresed well.  She was not on Lasix at home. ?She will follow-up with PCP and reevaluate to see if she is retaining fluid. ?If she gains 3 pounds in 1 day or 5 pounds over 1 week can start Lasix back again at low-dose as an outpatient. ? ?Pressure injury of the skin left heel present on admission ? ?Discharge Instructions ? ?Discharge Instructions   ? ? Diet - low sodium heart healthy   Complete by: As directed ?  ? Increase activity slowly   Complete by: As directed ?  ? No wound care   Complete by: As directed ?  ? ?  ? ?Allergies as of 04/18/2022   ? ?   Reactions  ? Codeine Other (See Comments)  ? Severe GI upset  ? Lisinopril Cough  ?   ? Hydrocodone-acetaminophen Nausea Only  ? ?  ? ?  ?Medication List  ?  ? ?STOP taking these medications   ? ?CRANBERRY PO ?  ?irbesartan 150 MG tablet ?Commonly known as: AVAPRO ?  ? ?  ? ?  TAKE these medications   ? ?acetaminophen 500 MG tablet ?Commonly known as: TYLENOL ?Take 500-1,000 mg by mouth 2 (two) times daily as needed for mild pain. ?  ?alendronate 70 MG tablet ?Commonly known as: FOSAMAX ?Take 70 mg by mouth every Friday. ?  ?amoxicillin-clavulanate 875-125 MG  tablet ?Commonly known as: AUGMENTIN ?Take 1 tablet by mouth every 12 (twelve) hours for 1 day. ?  ?gabapentin 300 MG capsule ?Commonly known as: NEURONTIN ?Take 900 mg by mouth See admin instructions. Take 3 capsules (900 mg) by mouth twice daily - afternoon and bedtime ?  ?Gerhardt's butt cream Crea ?Apply 1 application topically 2 (two) times daily. ?  ?Metoprolol Tartrate 75 MG Tabs ?Take 75 mg by mouth 2 (two) times daily. ?  ?Systane Ultra PF 0.4-0.3 % Soln ?Generic drug: Polyethyl Glyc-Propyl Glyc PF ?Place 1 drop into both eyes 3 (three) times daily. ?  ?Vitamin D (Ergocalciferol) 1.25 MG (50000 UNIT) Caps capsule ?Commonly known as: DRISDOL ?Take 50,000 Units by mouth every Wednesday. ?  ?Zinc Oxide 12.8 % ointment ?Commonly known as: TRIPLE PASTE ?Apply 1 application topically as needed for irritation. ?  ? ?  ? ? ?Allergies  ?Allergen Reactions  ? Codeine Other (See Comments)  ?  Severe GI upset ?  ? Lisinopril Cough  ?   ?  ? Hydrocodone-Acetaminophen Nausea Only  ? ? ?Consultations: ?None ? ? ?Procedures/Studies: ?MR BRAIN W WO CONTRAST ? ?Result Date: 04/14/2022 ?CLINICAL DATA:  76 year old female with a history of high-grade B-cell CNS lymphoma. Restaging. EXAM: MRI HEAD WITHOUT AND WITH CONTRAST TECHNIQUE: Multiplanar, multiecho pulse sequences of the brain and surrounding structures were obtained without and with intravenous contrast. CONTRAST:  53m GADAVIST GADOBUTROL 1 MMOL/ML IV SOLN COMPARISON:  Brain MRI 03/05/2022 and earlier. FINDINGS: Brain: Substantial regression of tumor since last month. Largely resolved anterior cerebral mass effect. Slightly ex vacuo appearance of both frontal horns now. Residual irregular, masslike T2 and FLAIR hyperintensity with abnormal enhancement located adjacent to the both frontal horns, greater on the left, crossing the corpus callosum, and tracking down into the left inferior frontal gyrus (series 15, image 27 and series 20, images 68-71). Hemosiderin and  susceptibility within the lesion now is more concentrated (series 13, image 38). Continued evidence of hypercellularity in the genu of the corpus callosum on DWI. The largest, confluent enhancing tumor component in the left inferior frontal gyrus now encompasses 37 by 21 x 18 mm (AP by transverse by CC) versus 5 cm or more month. There remains patchy abnormal basal ganglia enhancement on the right (series 20, image 66) but that 2 has regressed. No areas of worsening signal abnormality, enhancement, or mass effect. No superimposed restricted diffusion suggestive of acute infarction. No midline shift, ventriculomegaly, extra-axial collection or acute intracranial hemorrhage. Cervicomedullary junction and pituitary are within normal limits. Additional Patchy and confluent nonspecific cerebral white matter T2 and FLAIR hyperintensity in both hemispheres is stable since January. No new areas of abnormal enhancement or hemosiderin. Vascular: Major intracranial vascular flow voids are stable. Dominant left vertebral artery. Following contrast major dural venous sinuses are enhancing and appear to be patent. Skull and upper cervical spine: Visible spinal cord remains within normal limits. Degenerative ligamentous hypertrophy about the odontoid again noted. Visualized bone marrow signal is within normal limits. Hyperostosis frontalis. Sinuses/Orbits: Stable and negative. Other: Mastoids remain clear. Visible internal auditory structures appear normal. Negative visible scalp and face. IMPRESSION: 1. Substantial regression of CNS Lymphoma since last month. Residual left >  right anterior frontal and right basal ganglia tumor. But virtually resolved intracranial mass effect. No new sites of disease identified. 2. No new intracranial abnormality. Electronically Signed   By: Genevie Ann M.D.   On: 04/14/2022 06:04  ? ?DG CHEST PORT 1 VIEW ? ?Result Date: 04/14/2022 ?CLINICAL DATA:  Hypoxia, atrial fibrillation EXAM: PORTABLE CHEST 1  VIEW COMPARISON:  Chest radiograph from one day prior. FINDINGS: Left rotated chest radiograph. Right internal jugular Port-A-Cath terminates over the cavoatrial junction. Stable cardiomediastinal silhouette with

## 2022-04-21 LAB — BASIC METABOLIC PANEL
Anion gap: 5 (ref 5–15)
BUN: 17 mg/dL (ref 8–23)
CO2: 37 mmol/L — ABNORMAL HIGH (ref 22–32)
Calcium: 7.9 mg/dL — ABNORMAL LOW (ref 8.9–10.3)
Chloride: 102 mmol/L (ref 98–111)
Creatinine, Ser: 1.07 mg/dL — ABNORMAL HIGH (ref 0.44–1.00)
GFR, Estimated: 53 mL/min — ABNORMAL LOW (ref >60)
Glucose, Bld: 125 mg/dL — ABNORMAL HIGH (ref 70–99)
Potassium: 2.5 mmol/L — CL (ref 3.5–5.1)
Sodium: 144 mmol/L (ref 135–145)

## 2022-05-01 ENCOUNTER — Telehealth: Payer: Self-pay | Admitting: *Deleted

## 2022-05-01 NOTE — Telephone Encounter (Signed)
Returned call received.  Patient is at [facility] under going rehabilitation.  She has some periods of confusion.  Her wound is healing per the wound care nurse.  She is actively getting PT daily.  She still has great difficulty walking.  Still has foley catheter.  Encouraged them to follow up with urology if they haven't already. ?

## 2022-05-02 ENCOUNTER — Telehealth: Payer: Self-pay | Admitting: Internal Medicine

## 2022-05-02 NOTE — Telephone Encounter (Signed)
.  Called patient to schedule appointment per 5/4 [place], patient is aware of date and time.   ?

## 2022-05-12 ENCOUNTER — Telehealth: Payer: Self-pay | Admitting: *Deleted

## 2022-05-12 NOTE — Telephone Encounter (Signed)
Isabel Gibbs (son) called to discuss how to handle the visit later in this week.  He hasn't spoken yet with [facility] rehab to see if they can bring her over here via wheelchair but plans to.  He said that her level of confusion is higher than before.  States she sits often in a daze, doesn't carry on conversations anymore with them and is very limited in feeding herself and they often resort to feeding her.  ? ?They are willing to try to get her in the office for a visit but if a virtual or phone visit is available then they could do that as well. ? ?Routing to provider to advise how to proceed. ?

## 2022-05-14 ENCOUNTER — Emergency Department (HOSPITAL_COMMUNITY): Payer: Medicare Other

## 2022-05-14 ENCOUNTER — Observation Stay (HOSPITAL_COMMUNITY)
Admission: EM | Admit: 2022-05-14 | Discharge: 2022-05-15 | Disposition: A | Discharge status: Hospice | Payer: Medicare Other | Attending: Internal Medicine | Admitting: Internal Medicine

## 2022-05-14 ENCOUNTER — Encounter (HOSPITAL_COMMUNITY): Payer: Self-pay

## 2022-05-14 DIAGNOSIS — L89301 Pressure ulcer of unspecified buttock, stage 1: Secondary | ICD-10-CM

## 2022-05-14 DIAGNOSIS — R4182 Altered mental status, unspecified: Secondary | ICD-10-CM | POA: Diagnosis present

## 2022-05-14 DIAGNOSIS — G9340 Encephalopathy, unspecified: Secondary | ICD-10-CM | POA: Diagnosis not present

## 2022-05-14 DIAGNOSIS — L89151 Pressure ulcer of sacral region, stage 1: Secondary | ICD-10-CM | POA: Diagnosis not present

## 2022-05-14 DIAGNOSIS — E876 Hypokalemia: Secondary | ICD-10-CM | POA: Diagnosis present

## 2022-05-14 DIAGNOSIS — N3000 Acute cystitis without hematuria: Secondary | ICD-10-CM

## 2022-05-14 DIAGNOSIS — I110 Hypertensive heart disease with heart failure: Secondary | ICD-10-CM | POA: Diagnosis not present

## 2022-05-14 DIAGNOSIS — I5033 Acute on chronic diastolic (congestive) heart failure: Secondary | ICD-10-CM | POA: Diagnosis not present

## 2022-05-14 DIAGNOSIS — C8589 Other specified types of non-Hodgkin lymphoma, extranodal and solid organ sites: Secondary | ICD-10-CM | POA: Diagnosis not present

## 2022-05-14 DIAGNOSIS — R1310 Dysphagia, unspecified: Secondary | ICD-10-CM

## 2022-05-14 DIAGNOSIS — I4719 Other supraventricular tachycardia: Secondary | ICD-10-CM | POA: Diagnosis present

## 2022-05-14 DIAGNOSIS — I471 Supraventricular tachycardia: Secondary | ICD-10-CM | POA: Diagnosis present

## 2022-05-14 DIAGNOSIS — Z87891 Personal history of nicotine dependence: Secondary | ICD-10-CM | POA: Insufficient documentation

## 2022-05-14 DIAGNOSIS — N390 Urinary tract infection, site not specified: Secondary | ICD-10-CM | POA: Diagnosis present

## 2022-05-14 DIAGNOSIS — G500 Trigeminal neuralgia: Secondary | ICD-10-CM

## 2022-05-14 DIAGNOSIS — Z79899 Other long term (current) drug therapy: Secondary | ICD-10-CM | POA: Insufficient documentation

## 2022-05-14 DIAGNOSIS — I10 Essential (primary) hypertension: Secondary | ICD-10-CM | POA: Diagnosis present

## 2022-05-14 DIAGNOSIS — C8339 Primary central nervous system lymphoma: Secondary | ICD-10-CM | POA: Diagnosis present

## 2022-05-14 DIAGNOSIS — L8990 Pressure ulcer of unspecified site, unspecified stage: Secondary | ICD-10-CM | POA: Diagnosis present

## 2022-05-14 LAB — URINALYSIS, ROUTINE W REFLEX MICROSCOPIC
Bacteria, UA: NONE SEEN
Bilirubin Urine: NEGATIVE
Glucose, UA: NEGATIVE mg/dL
Ketones, ur: 80 mg/dL — AB
Nitrite: POSITIVE — AB
Protein, ur: 30 mg/dL — AB
Specific Gravity, Urine: 1.02 (ref 1.005–1.030)
WBC, UA: >50 WBC/hpf — ABNORMAL HIGH (ref 0–5)
pH: 5 (ref 5.0–8.0)

## 2022-05-14 LAB — CBC WITH DIFFERENTIAL/PLATELET
Abs Immature Granulocytes: 0.04 10*3/uL (ref 0.00–0.07)
Basophils Absolute: 0.1 10*3/uL (ref 0.0–0.1)
Basophils Relative: 1 %
Eosinophils Absolute: 0.3 10*3/uL (ref 0.0–0.5)
Eosinophils Relative: 3 %
HCT: 34.2 % — ABNORMAL LOW (ref 36.0–46.0)
Hemoglobin: 10.2 g/dL — ABNORMAL LOW (ref 12.0–15.0)
Immature Granulocytes: 0 %
Lymphocytes Relative: 17 %
Lymphs Abs: 1.6 10*3/uL (ref 0.7–4.0)
MCH: 28.9 pg (ref 26.0–34.0)
MCHC: 29.8 g/dL — ABNORMAL LOW (ref 30.0–36.0)
MCV: 96.9 fL (ref 80.0–100.0)
Monocytes Absolute: 0.8 10*3/uL (ref 0.1–1.0)
Monocytes Relative: 9 %
Neutro Abs: 6.7 10*3/uL (ref 1.7–7.7)
Neutrophils Relative %: 70 %
Platelets: 268 10*3/uL (ref 150–400)
RBC: 3.53 MIL/uL — ABNORMAL LOW (ref 3.87–5.11)
RDW: 14.5 % (ref 11.5–15.5)
WBC: 9.5 10*3/uL (ref 4.0–10.5)
nRBC: 0 % (ref 0.0–0.2)

## 2022-05-14 LAB — COMPREHENSIVE METABOLIC PANEL
ALT: 13 U/L (ref 0–44)
AST: 14 U/L — ABNORMAL LOW (ref 15–41)
Albumin: 2.7 g/dL — ABNORMAL LOW (ref 3.5–5.0)
Alkaline Phosphatase: 81 U/L (ref 38–126)
Anion gap: 9 (ref 5–15)
BUN: 14 mg/dL (ref 8–23)
CO2: 29 mmol/L (ref 22–32)
Calcium: 8.4 mg/dL — ABNORMAL LOW (ref 8.9–10.3)
Chloride: 106 mmol/L (ref 98–111)
Creatinine, Ser: 0.61 mg/dL (ref 0.44–1.00)
GFR, Estimated: >60 mL/min (ref >60)
Glucose, Bld: 133 mg/dL — ABNORMAL HIGH (ref 70–99)
Potassium: 3 mmol/L — ABNORMAL LOW (ref 3.5–5.1)
Sodium: 144 mmol/L (ref 135–145)
Total Bilirubin: 1 mg/dL (ref 0.3–1.2)
Total Protein: 5.7 g/dL — ABNORMAL LOW (ref 6.5–8.1)

## 2022-05-14 LAB — LACTIC ACID, PLASMA: Lactic Acid, Venous: 1.1 mmol/L (ref 0.5–1.9)

## 2022-05-14 LAB — PROTIME-INR
INR: 1.2 (ref 0.8–1.2)
Prothrombin Time: 15.1 seconds (ref 11.4–15.2)

## 2022-05-14 IMAGING — CR DG CHEST 2V
2 series · 2 of 2 positions shown · non-contrast
Comparison: Chest x-ray [DATE]

CLINICAL DATA: Suspect sepsis.

EXAM:
CHEST - 2 VIEW

[w chest lat]
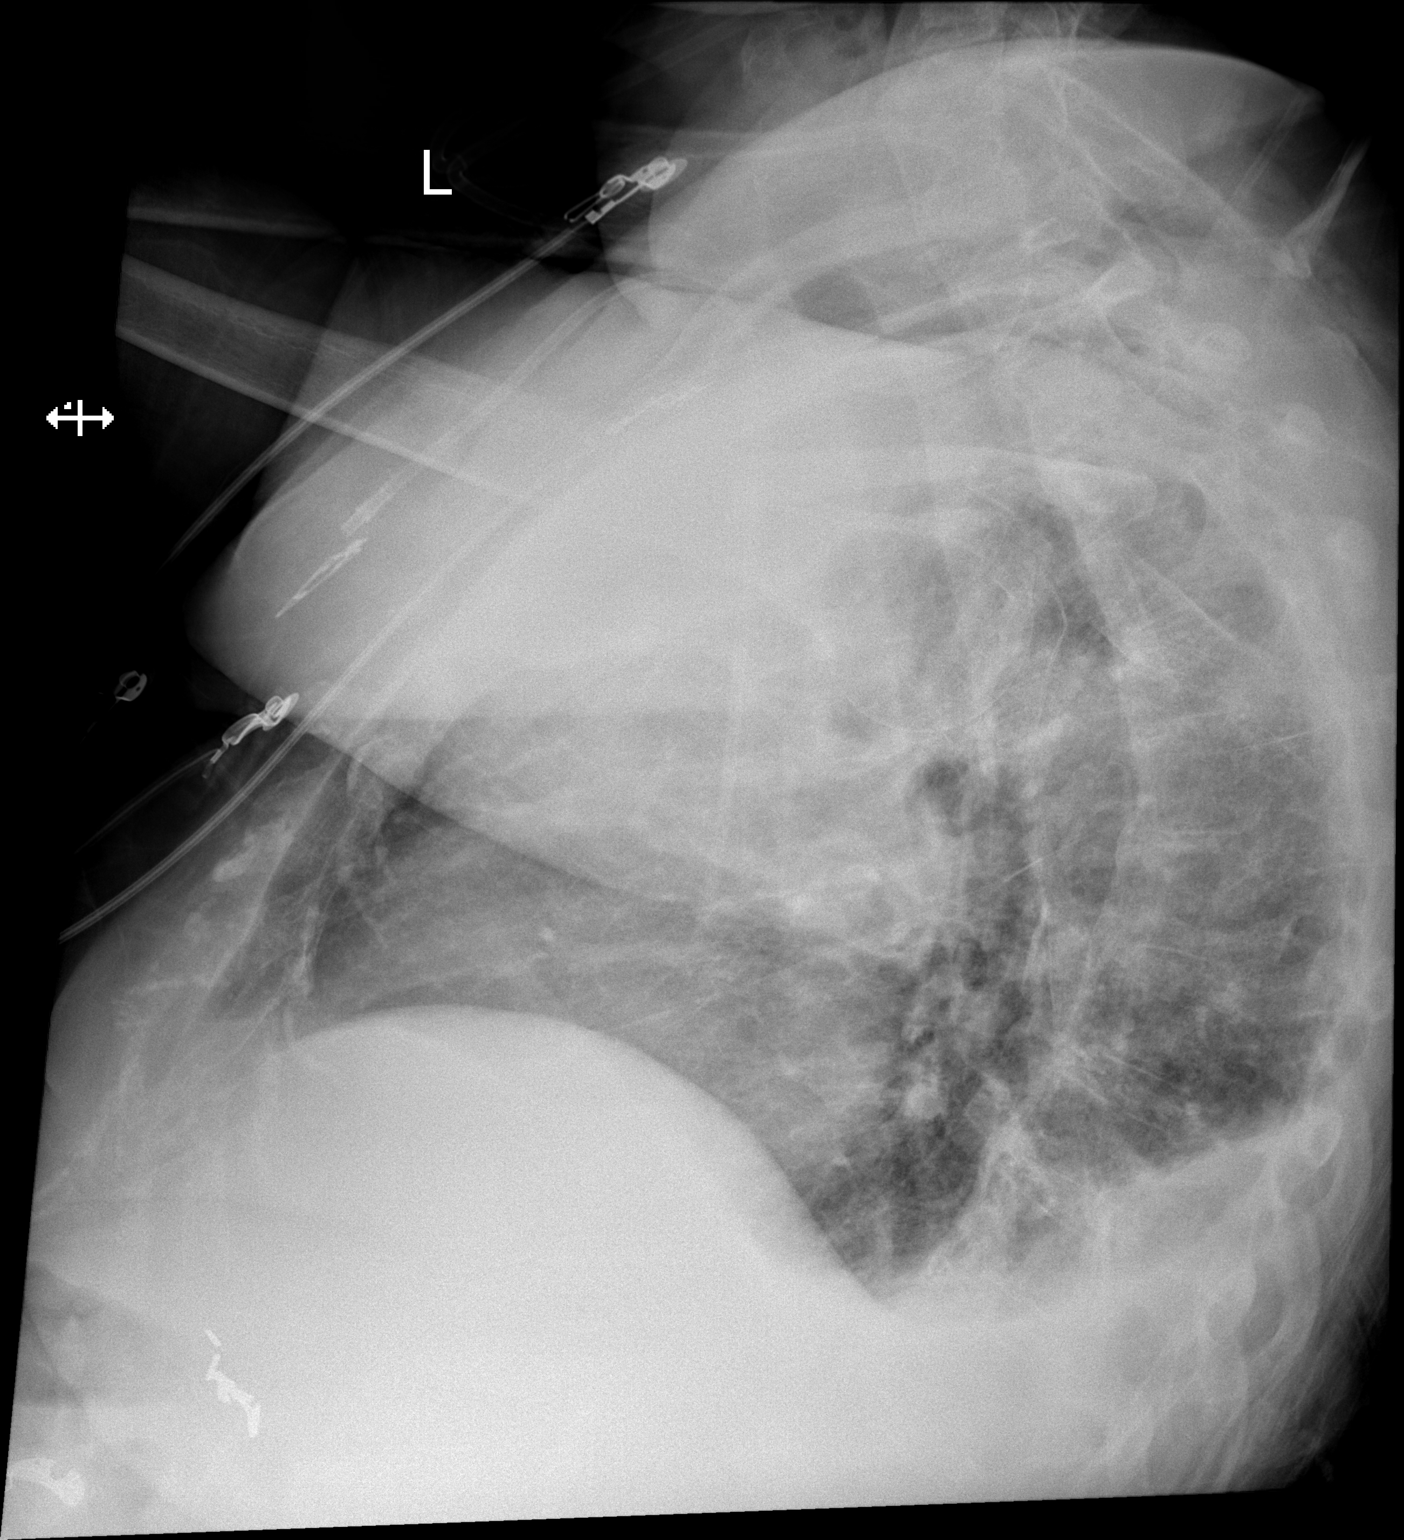

[x chest ap]
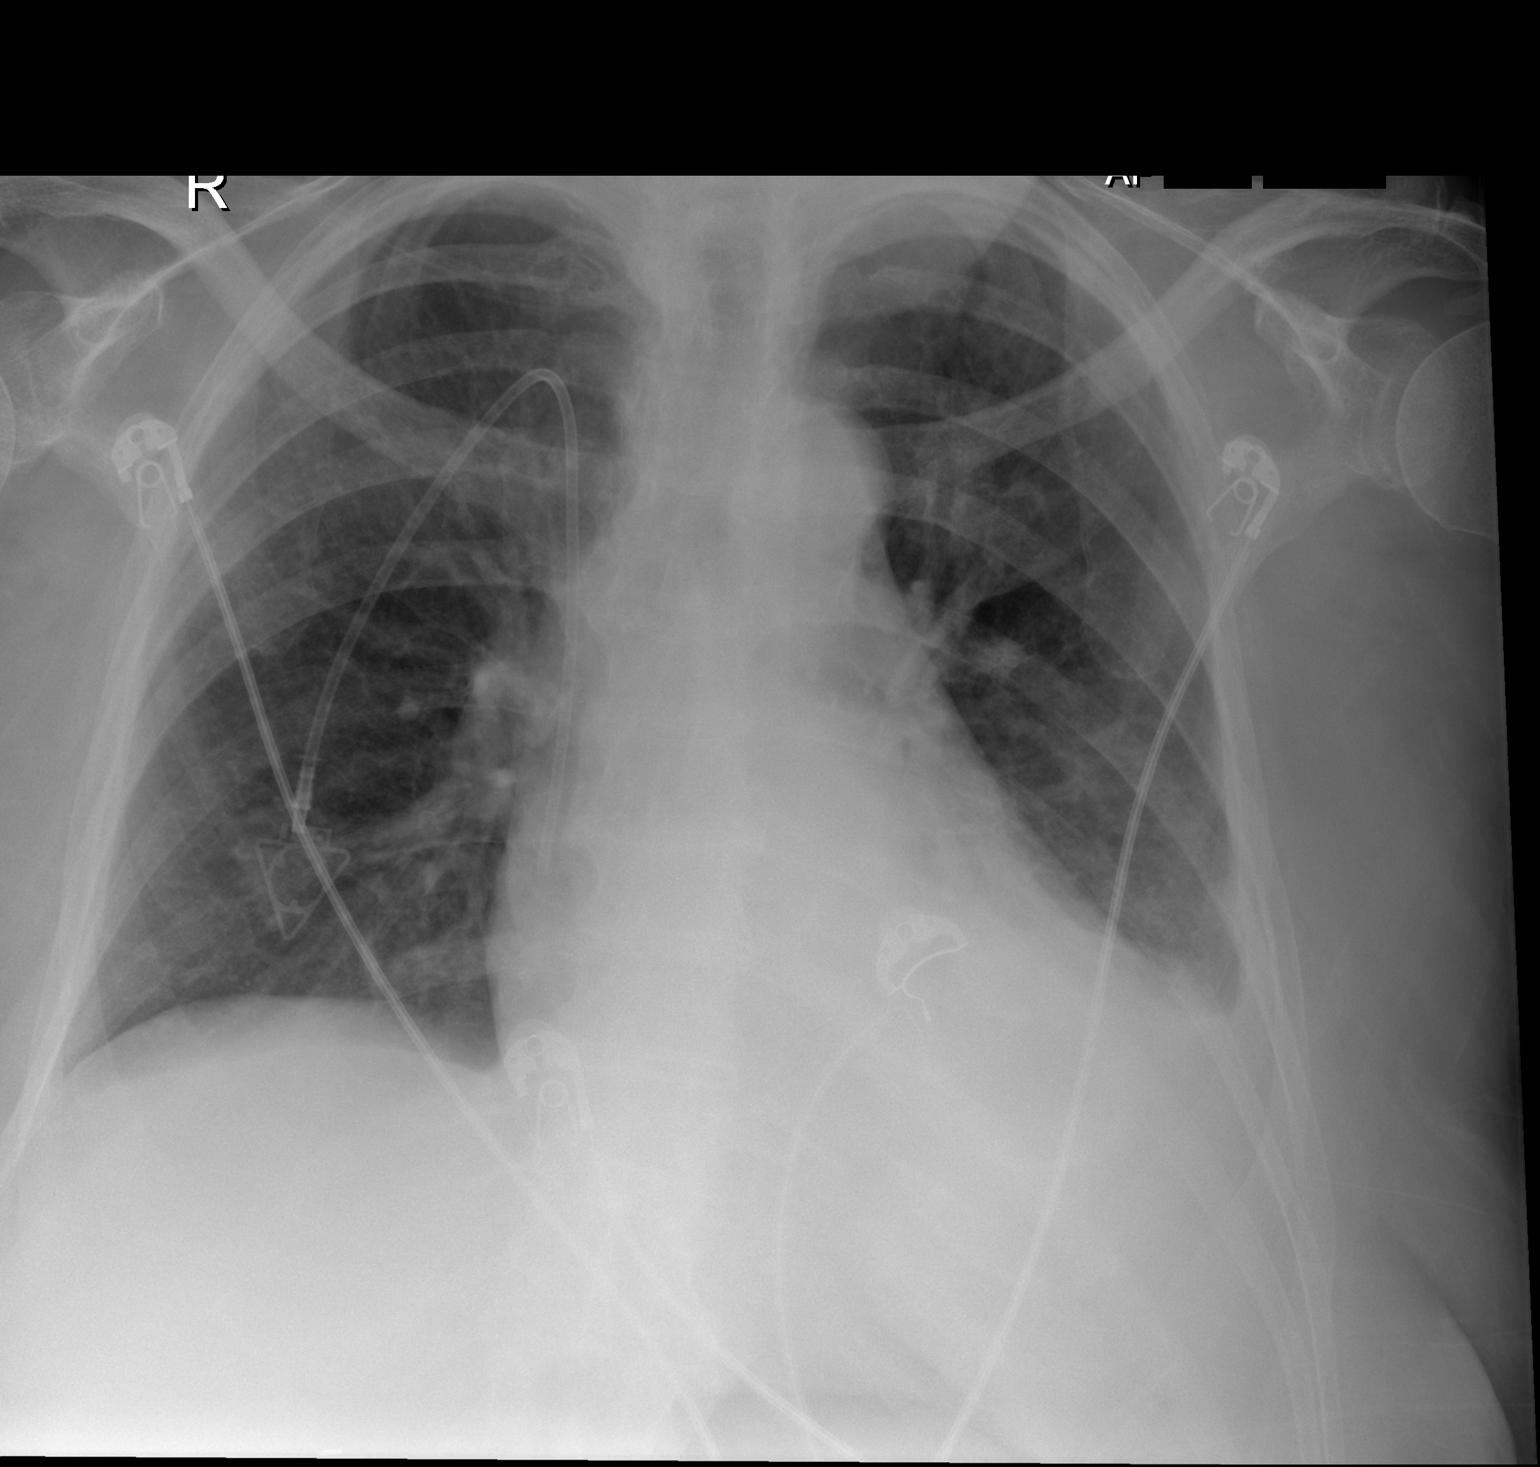

[2 of 2 positions shown; findings below may reference images not displayed]

FINDINGS: Right chest port catheter tip projects over the SVC. The heart is
mildly enlarged, unchanged. Small bilateral pleural effusions are
present. No evidence for pneumothorax. No acute fractures are seen.
IMPRESSION: 1. Small bilateral pleural effusions.
2. Left basilar atelectasis/airspace disease.
3. Cardiomegaly.

## 2022-05-14 IMAGING — CT CT HEAD W/O CM
3 series · 15 of 47 positions shown, 18 images · non-contrast
Comparison: [DATE], [DATE]

CLINICAL DATA: Altered level of consciousness, history of CNS
lymphoma



[Series 3: head wo · axial · 0.47mm/px · z∈[-155,-10]mm · 9 of 35 slices shown, 12 images]
[im 3/35  brain]
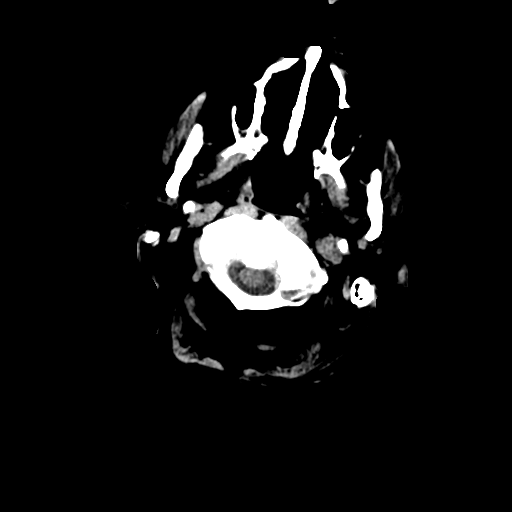
[im 3/35  bone]
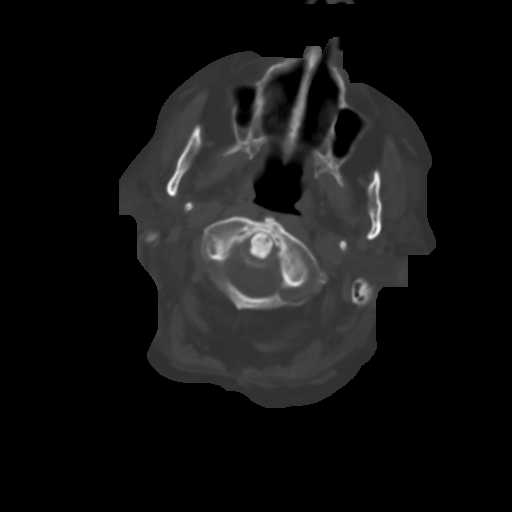
[im 6/35  brain]
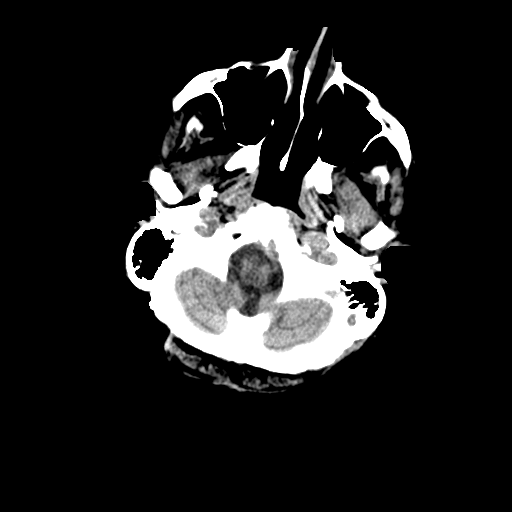
[im 10/35  brain]
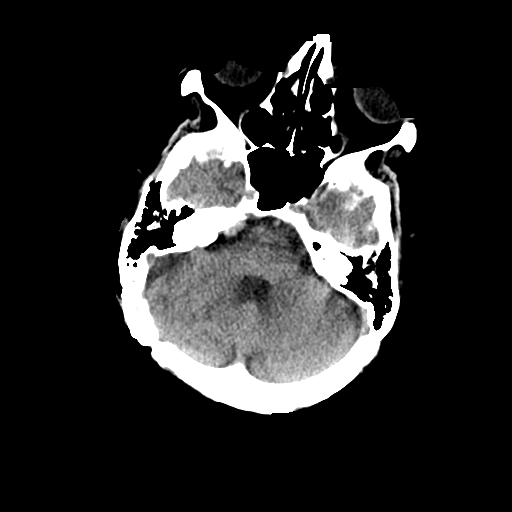
[im 13/35  brain]
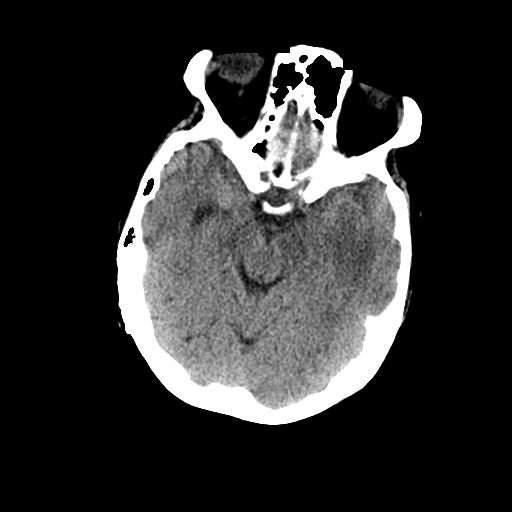
[im 18/35  brain]
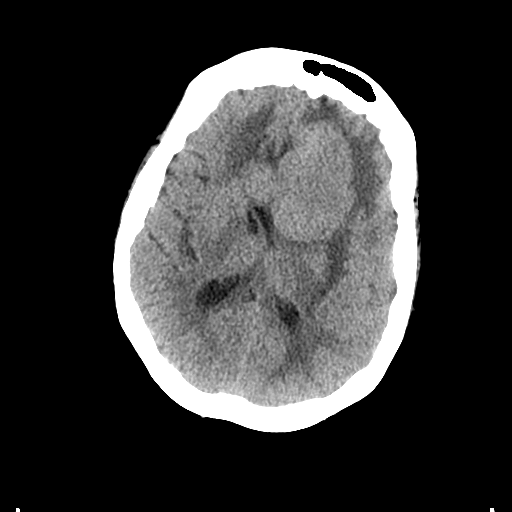
[im 18/35  bone]
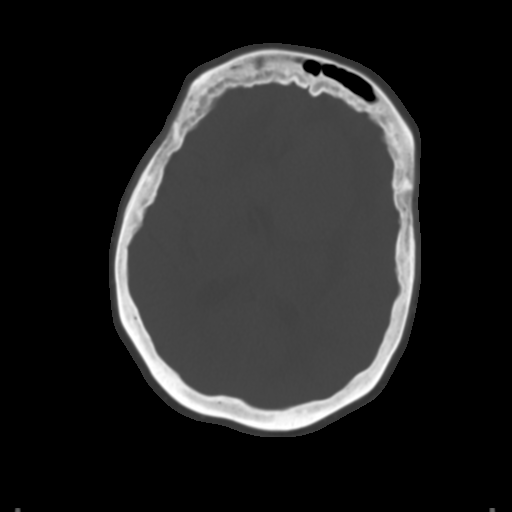
[im 22/35  brain]
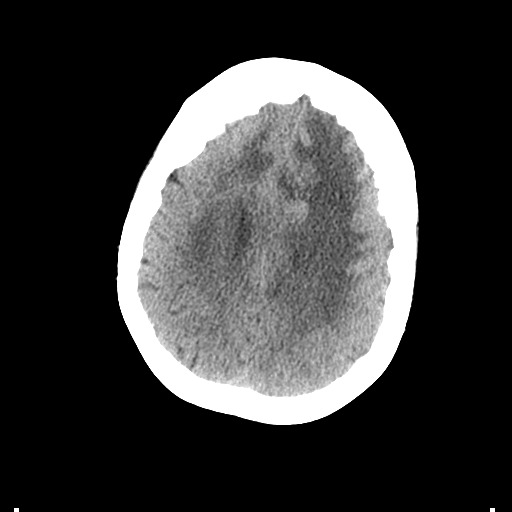
[im 25/35  brain]
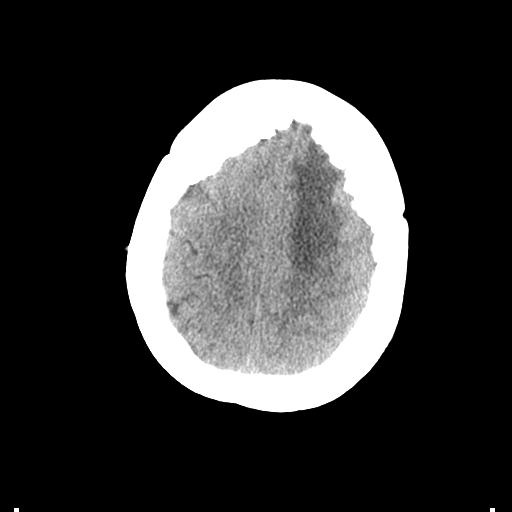
[im 29/35  brain]
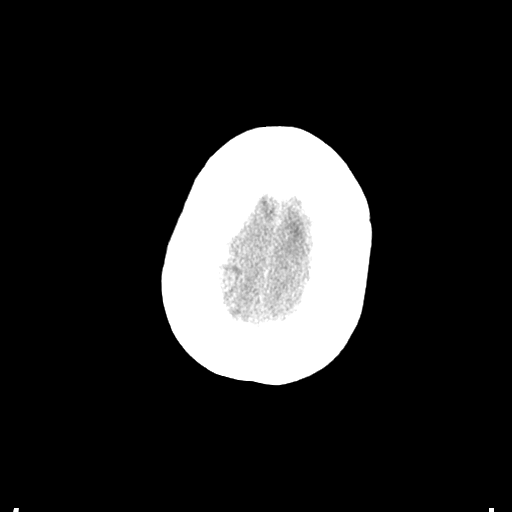
[im 32/35  brain]
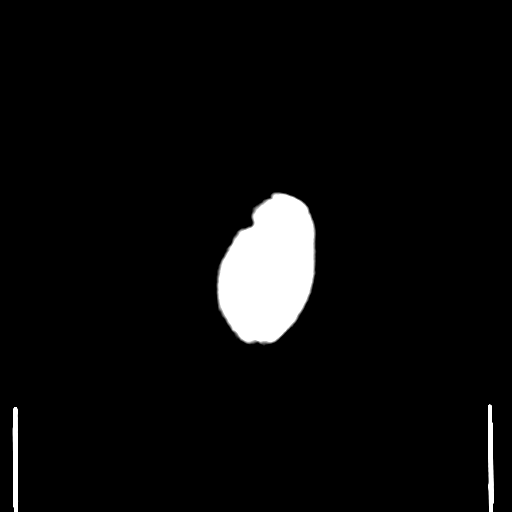
[im 32/35  bone]
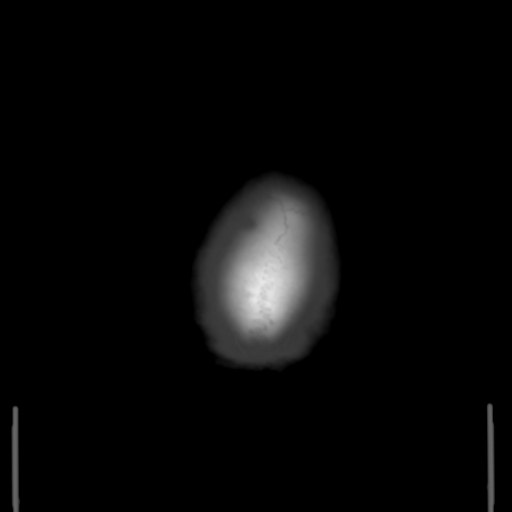

[Series 5: coronal soft tissue · coronal · 0.33mm/px · 3 of 67 slices shown]
[im 23/67  brain]
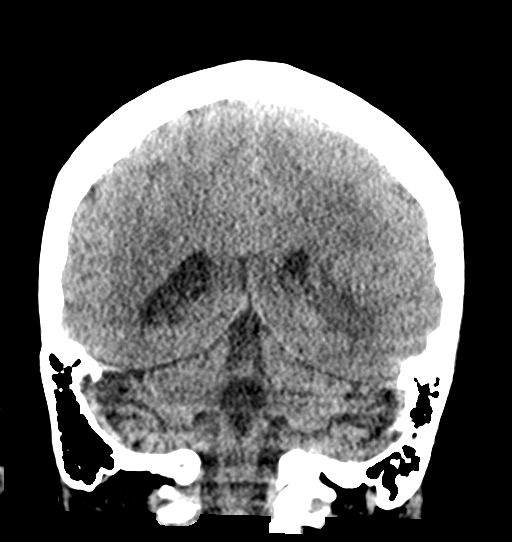
[im 30/67  brain]
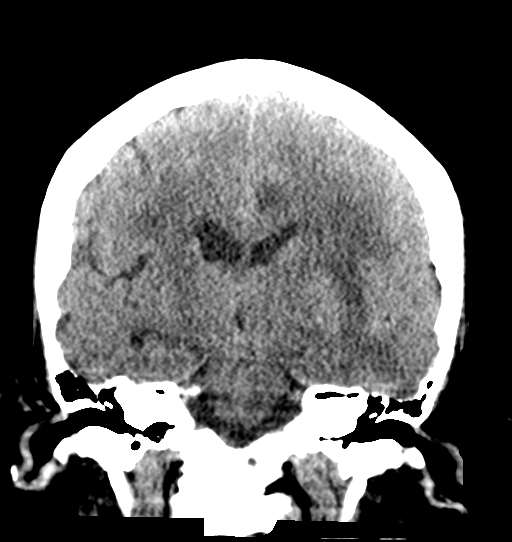
[im 37/67  brain]
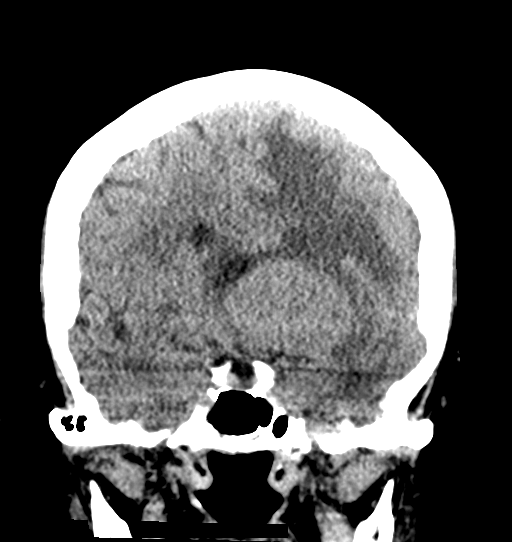

[Series 6: sagittal soft tissue · sagittal · 0.35mm/px · 3 of 57 slices shown]
[im 19/57  brain]
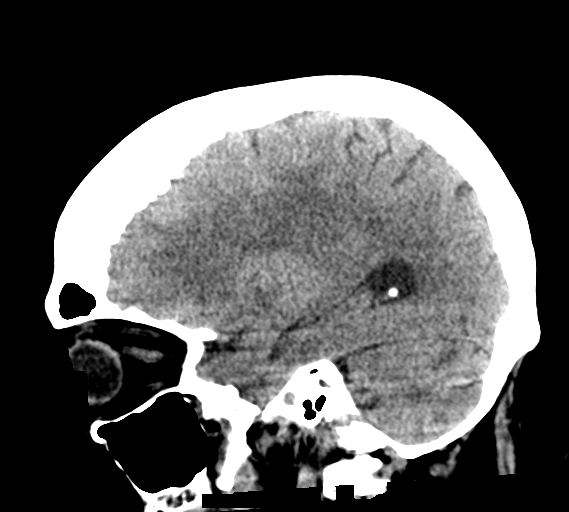
[im 29/57  brain]
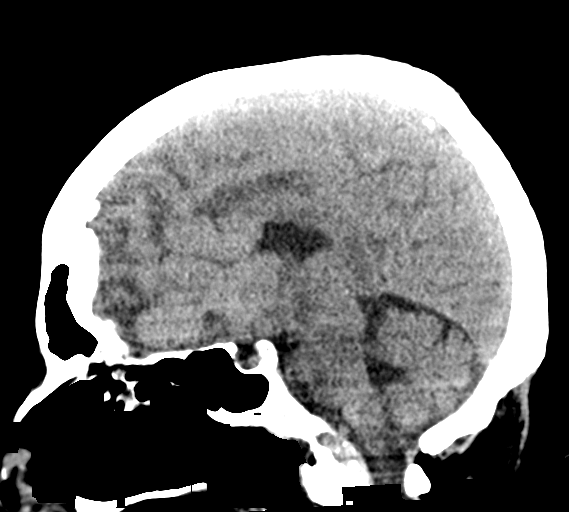
[im 38/57  brain]
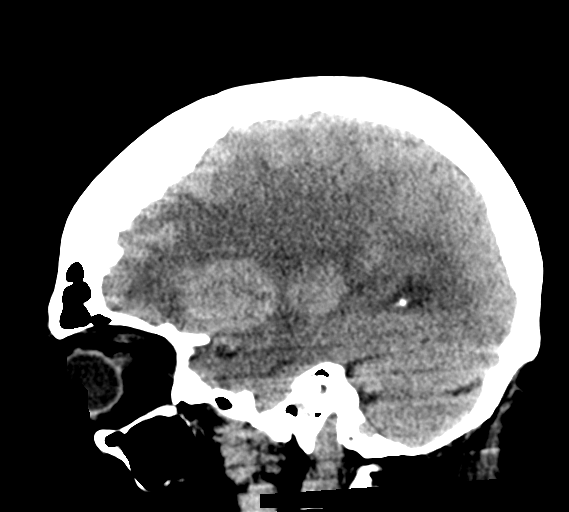

[15 of 47 positions shown; findings below may reference images not displayed]

FINDINGS: Brain: Since the previous MRI, there has been significant
progression of disease with marked enlargement of the lobular
bilateral frontal lobe mass seen on previous examinations. On
today's examination, this multi lobe mass centered in the left
frontal lobe extends into the left basal ganglia and across the
corpus callosum, measuring up to 4.4 x 5.7 by 6.2 cm, reference
image [DATE] and image [DATE]. On previous MRI this mass measured 3.7 x
2.1 x 1.8 cm. There is significant surrounding vasogenic edema, with
marked sulcal effacement throughout the left frontal lobe. There is
subfalcine herniation, and rightward midline shift measuring up to
approximately 1.8 cm at the level of the septum pellucidum.

No evidence of acute infarct or hemorrhage. No acute extra-axial
fluid collection.

Vascular: No hyperdense vessel or unexpected calcification.

Skull: Normal. Negative for fracture or focal lesion.

Sinuses/Orbits: No acute finding.

Other: None.
IMPRESSION: 1. Marked progression of a multilobular intracranial mass as
described above, centered in the left frontal lobe, consistent with
patient's history of CNS lymphoma. There is been significant
enlargement of this mass since MRI performed [DATE], with marked
surrounding vasogenic edema, sulcal effacement, and rightward
midline shift and subfalcine herniation.
2. No evidence of acute infarct or hemorrhage.

Critical Value/emergent results were called by telephone at the time
of interpretation on [DATE] at [DATE] to provider RONLOR
RONLOR , who verbally acknowledged these results.

## 2022-05-14 MED ORDER — VANCOMYCIN HCL 1500 MG/300ML IV SOLN
1500.0000 mg | INTRAVENOUS | Status: DC
  Administered 2022-05-15: 1500 mg via INTRAVENOUS
  Filled 2022-05-14: qty 300

## 2022-05-14 MED ORDER — FUROSEMIDE 10 MG/ML IJ SOLN
20.0000 mg | Freq: Once | INTRAMUSCULAR | Status: AC
  Administered 2022-05-14: 20 mg via INTRAVENOUS
  Filled 2022-05-14: qty 4

## 2022-05-14 MED ORDER — CEFTRIAXONE SODIUM 1 G IJ SOLR
1.0000 g | Freq: Once | INTRAMUSCULAR | Status: AC
  Administered 2022-05-14: 1 g via INTRAVENOUS
  Filled 2022-05-14: qty 10

## 2022-05-14 MED ORDER — POTASSIUM CHLORIDE 10 MEQ/100ML IV SOLN
10.0000 meq | INTRAVENOUS | Status: AC
  Administered 2022-05-14 (×3): 10 meq via INTRAVENOUS
  Filled 2022-05-14 (×3): qty 100

## 2022-05-14 MED ORDER — GABAPENTIN 300 MG PO CAPS: 900.0000 mg | ORAL_CAPSULE | Freq: Two times a day (BID) | ORAL | Status: DC

## 2022-05-14 MED ORDER — CHLORHEXIDINE GLUCONATE CLOTH 2 % EX PADS
6.0000 | MEDICATED_PAD | Freq: Every day | CUTANEOUS | Status: DC
  Administered 2022-05-15: 6 via TOPICAL

## 2022-05-14 MED ORDER — IRBESARTAN 150 MG PO TABS: 150.0000 mg | ORAL_TABLET | Freq: Every day | ORAL | Status: DC

## 2022-05-14 MED ORDER — METOPROLOL TARTRATE 5 MG/5ML IV SOLN
5.0000 mg | Freq: Once | INTRAVENOUS | Status: AC
  Administered 2022-05-14: 5 mg via INTRAVENOUS
  Filled 2022-05-14: qty 5

## 2022-05-14 MED ORDER — DEXAMETHASONE SODIUM PHOSPHATE 10 MG/ML IJ SOLN
10.0000 mg | Freq: Once | INTRAMUSCULAR | Status: AC
  Administered 2022-05-14: 10 mg via INTRAVENOUS
  Filled 2022-05-14: qty 1

## 2022-05-14 MED ORDER — SODIUM CHLORIDE 0.9% FLUSH: 10.0000 mL | INTRAVENOUS | Status: DC | PRN

## 2022-05-14 MED ORDER — METOPROLOL TARTRATE 25 MG PO TABS: 75.0000 mg | ORAL_TABLET | Freq: Two times a day (BID) | ORAL | Status: DC

## 2022-05-14 MED ORDER — SODIUM CHLORIDE 0.9 % IV SOLN: 1.0000 g | INTRAVENOUS | Status: DC

## 2022-05-14 MED ORDER — DEXAMETHASONE SODIUM PHOSPHATE 4 MG/ML IJ SOLN
4.0000 mg | Freq: Four times a day (QID) | INTRAMUSCULAR | Status: DC
  Administered 2022-05-15 (×3): 4 mg via INTRAVENOUS
  Filled 2022-05-14 (×3): qty 1

## 2022-05-14 NOTE — Assessment & Plan Note (Signed)
Stage 1 sacral ulcer. Continue to monitor ?

## 2022-05-14 NOTE — Assessment & Plan Note (Addendum)
Continue with home Lopressor if able to tolerate p.o. ?

## 2022-05-14 NOTE — H&P (Signed)
?History and Physical  ? ? ?Patient: Isabel Gibbs AVW:[id number] DOB: [birth year] ?DOA: 05/14/2022 ?DOS: the patient was seen and examined on 05/14/2022 ?PCP: Karleen Hampshire., MD  ?Patient coming from: ALF/ILF ? ?Chief Complaint:  ?Chief Complaint  ?Patient presents with  ? Altered Mental Status  ? ?HPI: Isabel Gibbs is a 76 y.o. 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 ? ?Two sons at bedside provides history.  Patient recently admitted from 4/15 to 4/21 with sepsis due to UTI and possible HCAP with acute respiratory failure and was discharged on oxygen.  She also was noted to have multifocal tachycardia seen by cardiology and started on Lopressor.  Due to physical deconditioning she was sent to skilled nursing facility temporarily for rehab.  However, family notes in the past week she has had gradual decline.  She was no longer talking and unable to feed herself.  Also can no longer move during physical therapy. ? ?She recently completed cycle 2 of chemotherapy on 03/30/22 and had significant weakness afterwards.  Also was treated with UTI.  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  Follows with Dr. Mickeal Skinner of oncology.  ? ?In the ED, she was afebrile and tachycardic 2 L.  CT head showing marked progression of multilobular intracranial mass with marked vasogenic edema and rightward midline shift. ?UA also grossly positive.  She was started on IV Rocephin. ? ?ED physician discussed with neuro-oncology and they did not recommend any further imaging or neurosurgery consult due to progression of her disease.  Palliative care and residential hospice has been consulted. ? ?Hospitalist on-call for admission. ? ?Review of Systems: unable to review all systems due to the inability of the  patient to answer questions. ?Past Medical History:  ?Diagnosis Date  ? Hypertension   ? Prediabetes   ? 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 ?Past Surgical History:  ?Procedure Laterality Date  ? APPLICATION OF CRANIAL NAVIGATION Left 01/02/2022  ? Procedure: APPLICATION OF CRANIAL NAVIGATION;  Surgeon: Judith Part, MD;  Location: [facility];  Service: Neurosurgery;  Laterality: Left;  ? CEREBRAL MICROVASCULAR DECOMPRESSION Right 08/02/2018  ? [place]  ? FRAMELESS  BIOPSY WITH BRAINLAB Left 01/02/2022  ? Procedure: LEFT BRAIN  BIOPSY WITH Lucky Rathke;  Surgeon: Judith Part, MD;  Location: [facility];  Service: Neurosurgery;  Laterality: Left;  ? IR IMAGING GUIDED PORT INSERTION  03/04/2022  ? RADIOLOGY WITH ANESTHESIA N/A 01/01/2022  ? Procedure: MRI of Brain with and without contrast;  Surgeon: Radiologist, Medication, MD;  Location: [facility];  Service: Radiology;  Laterality: N/A;  ? ?Social History:  reports that she quit smoking about 23 years ago. Her smoking use included cigarettes. She has never used smokeless tobacco. She reports that she does not drink alcohol and does not use drugs. ? ?Allergies  ?Allergen Reactions  ? Codeine Other (See Comments)  ?  Severe GI upset ?  ? Lisinopril Cough  ?   ?  ? Hydrocodone-Acetaminophen Nausea Only  ? ? ?Family History  ?Problem Relation Age of Onset  ? Heart failure Brother   ? Diabetes Brother   ? Leukemia Brother   ? ? ?Prior to Admission medications   ?Medication Sig Start Date End Date Taking? Authorizing Provider  ?acetaminophen (TYLENOL) 500 MG tablet  Take 500-1,000 mg by mouth 2 (two) times daily as needed for mild pain.   Yes [provider]  ?alendronate (FOSAMAX) 70 MG tablet Take 70 mg by mouth every Friday. 11/14/21  Yes [provider]  ?gabapentin (NEURONTIN) 300 MG capsule Take 900 mg by mouth 2 (two) times daily.   Yes [provider]  ?irbesartan (AVAPRO) 150 MG tablet Take 150 mg by mouth daily. 05/07/22  Yes [provider]  ?Metoprolol Tartrate 75 MG TABS Take 75 mg by mouth 2 (two) times daily. 04/18/22  Yes Charlynne Cousins, MD  ?Nystatin (GERHARDT'S BUTT CREAM) CREA Apply 1 application topically 2 (two) times daily. 04/07/22  Yes Vaslow, Acey Lav, MD  ?SYSTANE ULTRA PF 0.4-0.3 % SOLN Place 1 drop into both eyes 3 (three) times daily.   Yes [provider]  ?Vitamin D, Ergocalciferol, (DRISDOL) 1.25 MG (50000 UNIT) CAPS capsule Take 50,000 Units by mouth every Wednesday.   Yes [provider]  ? ? ?Physical Exam: ?Vitals:  ? 05/14/22 1830 05/14/22 1833 05/14/22 1845 05/14/22 1900  ?BP: 128/75 128/75  (!) 150/81  ?Pulse: 68 68  69  ?Resp:  19 19   ?Temp:      ?TempSrc:      ?SpO2: 93% 93%  98%  ? ?Constitutional: NAD, calm, comfortable, obese female sitting upright in bed and looking around ?Eyes: ids and conjunctivae normal ?ENMT: Mucous membranes are moist.  ?Neck: normal, supple,  ?Respiratory: Coarse breath sounds throughout but, no wheezing, no crackles. Normal respiratory effort ON 2L. No accessory muscle use.  ?Cardiovascular: Regular rate and rhythm, no murmurs / rubs / gallops.  +4 pitting edema bilateral lower extremity up to bilateral knee.  ?Abdomen: No grimacing with palpation of the abdomen, no masses palpated.  Bowel sounds positive.  ?Musculoskeletal: no clubbing / cyanosis. No joint deformity upper and lower extremities. Normal muscle tone.  ?Skin: no rashes, lesions, ulcers.  ?Neurologic: Patient is alert but unable to answer questions.  She said no appropriately to 1 question but otherwise did not speak afterwards.  She is not able to follow commands.  She was able to move her hand and to reach out to her son. ?Psychiatric: Normal judgment and insight. Alert and oriented x 3. Normal mood. ?Data Reviewed: ? ?See HPI ? ?Assessment and Plan: ?Acute encephalopathy ?Secondary to worsening of CNS lymphoma with CT head showing marked progression of multilobular intracranial mass with  marked vasogenic edema and rightward midline shift. ?-Neuro-oncology was consulted by ED physician and does not recommend any further imaging or neurosurgery consult.  Continue Decadron 4 mg q6hr. ?-Palliative care and residential hospice has been consulted.  Her 3 sons are her healthcare power of attorney.  Discussed her poor prognosis and need for end-of-life planning.  Sons at bedside wish for her to be DNR.  At this time they do not think they will put her through any more aggressive treatment with chemotherapy and is not interested in feeding tube if she continues to decline. ? ?UTI (urinary tract infection) ?Recently had UTI last month with culture positive for enterococcus Faecalis susceptible to Vancomycin.  ?-Received IV Rocephin in the ED.  Will switch to IV vancomycin with urine culture pending. ?-Presented with indwelling foley catheter ? ?CNS lymphoma ([place]) ? recently completed cycle 2 of chemotherapy on 03/30/22 on methotrexate and rituximab ?Follows with Dr. Mickeal Skinner of oncology.  ?See plans above regarding palliative care consult ? ?Hypertension ?Continue irbesartan if able to tolerate po ? ?  Acute on chronic diastolic CHF (congestive heart failure) ([place]) ?Not on diuretic outpatient ?-Appears intravascularly depleted with heme concentrated but appears hypervolemic on exam with significant bilateral lower extremity edema.  Chest x-ray also so small bilateral effusion. ?-will trial one time dose of IV '20mg'$  Lasix with strict intake and output, daily weights. If there is good output, will continue daily diuresis.  ? ?Dysphagia ?Previously placed on dysphasic 3 diet at recent admission.  However she appears to have further decline.  Will keep n.p.o. with medication only if able to tolerate. ?-will have speech therapy re-evaluate ? ?Trigeminal neuralgia ?Continue gabapentin for now if able to tolerate ? ?Pressure injury of skin ?Stage 1 sacral ulcer. Continue to monitor ? ?Hypokalemia ?Repleted with IV K x3 in  the ED.  Follow with repeat in the morning. ? ?Multifocal atrial tachycardia ([place]) ?Continue with home Lopressor if able to tolerate p.o. ? ? ? ? ? Advance Care Planning:   Code Status: DNR confirmed with so

## 2022-05-14 NOTE — Progress Notes (Signed)
Transition of Care [facility]) - Emergency Department Mini Assessment ? ? ?Patient Details  ?Name: Isabel Gibbs ?MRN: [id number] ?Date of Birth: 20-Feb-1946 ? ?Transition of Care (TOC) CM/SW Contact:    ?Roseanne Kaufman, RN ?Phone Number: ?05/14/2022, 6:51 PM ? ? ?Clinical Narrative: ?Patient presented to [facility] ED from [facility] due to c/o difficulty following commands and swallowing/ no interest in food. RNCM received TOC consult for residential hospice. ? ? ?ED Mini Assessment: ?  ?RNCM spoke with patient and her three sons at bedside. This Patient's sons indicated they were unsure of if they wanted home hospice or residential hospice. The family is interested in the hospice in [facility], unsure of name. This RNCM will send referral to [facility]. RNCM also advised of other hospice services as well (shared pamphlets to review). ? ?TOC will continue to follow. ? ?  ?Patient Contact and Communications ? Ieesha, AbbasiSon) [phone] [facility]), Dominica Severin (Son) [phone], [name] (son) [phone] ?  ?Admission diagnosis:  AMS (altered mental status) [R41.82] ?Patient Active Problem List  ? Diagnosis Date Noted  ? AMS (altered mental status) 05/14/2022  ? Pressure injury of skin 04/14/2022  ? HCAP (healthcare-associated pneumonia) 04/13/2022  ? Sepsis ([place]) 04/12/2022  ? Hypokalemia 04/06/2022  ? Primary CNS lymphoma ([place]) 03/10/2022  ? UTI (urinary tract infection) 03/05/2022  ? Acute encephalopathy 03/05/2022  ? Goals of care, counseling/discussion 02/17/2022  ? CNS lymphoma ([place]) 01/27/2022  ? Brain mass 01/02/2022  ? Brain lesion 12/31/2021  ? Multifocal atrial tachycardia ([place]) 12/31/2021  ? Prolonged QT interval 12/31/2021  ? Hypertension   ? Prediabetes   ? ?PCP:  Karleen Hampshire., MD ?Pharmacy:   ?[facility] [address] ?[place] [address] ?Phone: [phone] Fax: [phone] ? ?[facility] ?[address] ?[place] [address] ?Phone: [phone] Fax: [phone] ?  ?

## 2022-05-14 NOTE — Assessment & Plan Note (Addendum)
-  Presented with indwelling foley catheter. -Plan to proceed with comfort care.  Discontinue IV antibiotics

## 2022-05-14 NOTE — Progress Notes (Signed)
Pharmacy Antibiotic Note ? ?Isabel Gibbs is a 76 y.o. female admitted on 05/14/2022 with UTI.  Pharmacy has been consulted for vancomycin dosing. ? ?Plan: ?Vancomycin '1500mg'$  IV q24h for estimated AUC 427 using SCr rounded to 0.8 ?Vancomycin levels as needed, goal AUC 400-550 ?Follow up renal function & cultures ? ?  ? ?Temp (24hrs), Avg:98.4 ?F (36.9 ?C), Min:98.4 ?F (36.9 ?C), Max:98.4 ?F (36.9 ?C) ? ?Recent Labs  ?Lab 05/14/22 ?1414  ?WBC 9.5  ?CREATININE 0.61  ?LATICACIDVEN 1.1  ?  ?CrCl cannot be calculated (Unknown ideal weight.).   ? ?Allergies  ?Allergen Reactions  ? Codeine Other (See Comments)  ?  Severe GI upset ?  ? Lisinopril Cough  ?   ?  ? Hydrocodone-Acetaminophen Nausea Only  ? ? ?Antimicrobials this admission:  ?5/17 CTX x 1 ?5/17 Vanc >> ? ?Dose adjustments this admission:  ? ?Microbiology results:  ?5/17 UCx: ? ?Previous: ?4/15 UCx: > 100k colonies E. Faecalis (pansens)  ? ?Thank you for allowing pharmacy to be a part of this patient?s care. ? ?Peggyann Juba, PharmD, BCPS ?Pharmacy: [phone] ?05/14/2022 9:15 PM ? ?

## 2022-05-14 NOTE — ED Provider Notes (Signed)
?[facility] DEPT ?Provider Note ? ? ?CSN: [id number] ?Arrival date & time: 05/14/22  1342 ? ?  ? ?History ? ?Chief Complaint  ?Patient presents with  ? Altered Mental Status  ? ? ?Isabel Gibbs is a 76 y.o. female. ? ?HPI ? ?  ? 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  ? ?Her 3 sons are at bedside and provide the history.  They report that she has been residing at [place].  3 weeks ago, she was able to repeat things that they said, interact, however over the past week, she has not been speaking the way she normally does.  They report it looks like she is trying to think of an answer when they ask her something and then does not speak, or at times will provide only one-word answers.  She is normally able to state her name, sometimes is able to state the location, but has not been able to do that.  1 son also reports last week at physical therapy he thought her right side seem to be more weak than the left--he thought that this was new.  They report she was diagnosed with a urinary tract infection a week ago and they started her on antibiotics, however the culture returned negative.  She has not been able to walk for a long period of time, for several months.  She has been on oxygen since she left the hospital for admission for sepsis secondary to pneumonia.  Family reports that over the last week she has seemed to have coughing when she eats, and seems to have difficulty swallowing.  They report 3 weeks ago, she was probably able to feed herself, but over the last because needed family members to feed her, and has appeared to have increased aspiration.  She has not appeared to be short of breath, has not had vomiting, no falls, no medication changes other than the antibiotic she was prescribed last week.  No known  fevers. ? ?Past Medical History:  ?Diagnosis Date  ? Hypertension   ? Prediabetes   ? 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  ?Home Medications ?Prior to Admission medications   ?Medication Sig Start Date End Date Taking? Authorizing Provider  ?acetaminophen (TYLENOL) 500 MG tablet Take 500-1,000 mg by mouth 2 (two) times daily as needed for mild pain.   Yes [provider]  ?alendronate (FOSAMAX) 70 MG tablet Take 70 mg by mouth every Friday. 11/14/21  Yes [provider]  ?gabapentin (NEURONTIN) 300 MG capsule Take 900 mg by mouth 2 (two) times daily.   Yes [provider]  ?irbesartan (AVAPRO) 150 MG tablet Take 150 mg by mouth daily. 05/07/22  Yes [provider]  ?Metoprolol Tartrate 75 MG TABS Take 75 mg by mouth 2 (two) times daily. 04/18/22  Yes Charlynne Cousins, MD  ?Nystatin (GERHARDT'S BUTT CREAM) CREA Apply 1 application topically 2 (two) times daily. 04/07/22  Yes Vaslow, Acey Lav, MD  ?SYSTANE ULTRA PF 0.4-0.3 % SOLN Place 1 drop into both eyes 3 (three) times daily.   Yes [provider]  ?Vitamin D, Ergocalciferol, (DRISDOL) 1.25 MG (50000 UNIT) CAPS capsule Take 50,000 Units by mouth every Wednesday.   Yes [provider]  ?   ? ?Allergies    ?Codeine, Lisinopril, and Hydrocodone-acetaminophen   ? ?Review  of Systems   ?Review of Systems ? ?Physical Exam ?Updated Vital Signs ?BP (!) 161/83   Pulse 83   Temp 98 ?F (36.7 ?C) (Oral)   Resp (!) 24   SpO2 95%  ?Physical Exam ?Vitals and nursing note reviewed.  ?Constitutional:   ?   General: She is not in acute distress. ?   Appearance: She is well-developed. She is ill-appearing. She is not diaphoretic.  ?HENT:  ?   Head: Normocephalic and atraumatic.  ?Eyes:  ?   Conjunctiva/sclera: Conjunctivae normal.  ?Cardiovascular:  ?   Rate and Rhythm: Normal rate and regular rhythm.  ?   Heart sounds: Normal heart sounds. No murmur heard. ?  No friction rub. No gallop.  ?Pulmonary:  ?   Effort:  Pulmonary effort is normal. No respiratory distress.  ?   Breath sounds: Normal breath sounds. No wheezing or rales.  ?Abdominal:  ?   General: There is no distension.  ?   Palpations: Abdomen is soft.  ?   Tenderness: There is no abdominal tenderness. There is no guarding.  ?Musculoskeletal:     ?   General: No tenderness.  ?   Cervical back: Normal range of motion.  ?Skin: ?   General: Skin is warm and dry.  ?   Findings: No erythema or rash.  ?Neurological:  ?   Mental Status: She is alert.  ?   GCS: GCS eye subscore is 4. GCS motor subscore is 6.  ?   Comments: Follows some commands, holds up both arms, slight pronation RUE. Does not lift either LE to command (family reports she has had chronic LE weakness as well) ?Face appears symmetric. Appears to have full ROM of eyes but question latera gaze on right eye, has difficulty following commands. Not speaking, not answering name  ? ? ?ED Results / Procedures / Treatments   ?Labs ?(all labs ordered are listed, but only abnormal results are displayed) ?Labs Reviewed  ?COMPREHENSIVE METABOLIC PANEL - Abnormal; Notable for the following components:  ?    Result Value  ? Potassium 3.0 (*)   ? Glucose, Bld 133 (*)   ? Calcium 8.4 (*)   ? Total Protein 5.7 (*)   ? Albumin 2.7 (*)   ? AST 14 (*)   ? All other components within normal limits  ?CBC WITH DIFFERENTIAL/PLATELET - Abnormal; Notable for the following components:  ? RBC 3.53 (*)   ? Hemoglobin 10.2 (*)   ? HCT 34.2 (*)   ? MCHC 29.8 (*)   ? All other components within normal limits  ?URINALYSIS, ROUTINE W REFLEX MICROSCOPIC - Abnormal; Notable for the following components:  ? APPearance CLOUDY (*)   ? Hgb urine dipstick SMALL (*)   ? Ketones, ur 80 (*)   ? Protein, ur 30 (*)   ? Nitrite POSITIVE (*)   ? Leukocytes,Ua LARGE (*)   ? WBC, UA >50 (*)   ? All other components within normal limits  ?URINE CULTURE  ?LACTIC ACID, PLASMA  ?PROTIME-INR  ?LACTIC ACID, PLASMA  ?BASIC METABOLIC PANEL  ?CBC  ? ? ?EKG ?EKG  Interpretation ? ?Date/Time:  Wednesday May 14 2022 17:04:19 EDT ?Ventricular Rate:  116 ?PR Interval:  141 ?QRS Duration: 88 ?QT Interval:  340 ?QTC Calculation: 473 ?R Axis:   27 ?Text Interpretation: Multifocal atrial tachycardia Multiform ventricular premature complexes Borderline low voltage, extremity leads Confirmed by Gareth Morgan [id number]) on 05/14/2022 11:46:31 PM ? ?Radiology ?DG Chest 2 View ? ?  Result Date: 05/14/2022 ?CLINICAL DATA:  Suspect sepsis. EXAM: CHEST - 2 VIEW COMPARISON:  Chest x-ray 04/14/2022 FINDINGS: Right chest port catheter tip projects over the SVC. The heart is mildly enlarged, unchanged. Small bilateral pleural effusions are present. No evidence for pneumothorax. No acute fractures are seen. IMPRESSION: 1. Small bilateral pleural effusions. 2. Left basilar atelectasis/airspace disease. 3. Cardiomegaly. Electronically Signed   By: Ronney Asters M.D.   On: 05/14/2022 15:47  ? ?CT Head Wo Contrast ? ?Result Date: 05/14/2022 ?CLINICAL DATA:  Altered level of consciousness, history of CNS lymphoma EXAM: CT HEAD WITHOUT CONTRAST TECHNIQUE: Contiguous axial images were obtained from the base of the skull through the vertex without intravenous contrast. RADIATION DOSE REDUCTION: This exam was performed according to the departmental dose-optimization program which includes automated exposure control, adjustment of the mA and/or kV according to patient size and/or use of iterative reconstruction technique. COMPARISON:  03/05/2022, 04/12/2022 FINDINGS: Brain: Since the previous MRI, there has been significant progression of disease with marked enlargement of the lobular bilateral frontal lobe mass seen on previous examinations. On today's examination, this multi lobe mass centered in the left frontal lobe extends into the left basal ganglia and across the corpus callosum, measuring up to 4.4 x 5.7 by 6.2 cm, reference image 17/3 and image 28/5. On previous MRI this mass measured 3.7 x 2.1 x 1.8  cm. There is significant surrounding vasogenic edema, with marked sulcal effacement throughout the left frontal lobe. There is subfalcine herniation, and rightward midline shift measuring up to approximately

## 2022-05-14 NOTE — Assessment & Plan Note (Addendum)
recently completed cycle 2 of chemotherapy on 03/30/22 on methotrexate and rituximab Follows with Dr. Mickeal Skinner of oncology.  See plans above regarding palliative care.

## 2022-05-14 NOTE — ED Triage Notes (Signed)
Pt BIBA from [facility] for [place]. Usually A&Ox1, today A&Ox0. Facility/family reports difficulty following commands and swallowing/no interest in food. Does require feeding at baseline. Hx brain tumor, cancer. No fevers. Hx afib. 20ga LAC 250cc NS en route. CXR today at facility d/t productive cough. Recently on abx for UTI. ? ?142/60 ?HR 80 w PAC, PVC, PSVT ?

## 2022-05-14 NOTE — Assessment & Plan Note (Addendum)
Secondary to worsening of CNS lymphoma with CT head showing marked progression of multilobular intracranial mass with marked vasogenic edema and rightward midline shift. -Neuro-oncology was consulted by ED physician and does not recommend any further imaging or neurosurgery consult.  Continue Decadron 4 mg q6hr. -Palliative care and residential hospice has been consulted. -Family agreed to proceed with comfort care, discontinue IV fluids IV antibiotics, plan for residential hospice placement. Continue with oral Decadron

## 2022-05-14 NOTE — Assessment & Plan Note (Signed)
Continue gabapentin for now if able to tolerate ?

## 2022-05-14 NOTE — Assessment & Plan Note (Addendum)
Received a dose of IV Lasix.  Patient with poor oral intake.  Hold on any further diuretics

## 2022-05-14 NOTE — Assessment & Plan Note (Addendum)
Replace

## 2022-05-14 NOTE — Assessment & Plan Note (Addendum)
Continue to hold oral medications

## 2022-05-14 NOTE — Assessment & Plan Note (Addendum)
Patient had significant risk for aspiration.  Continue with comfort feeding

## 2022-05-15 ENCOUNTER — Other Ambulatory Visit: Payer: Self-pay

## 2022-05-15 ENCOUNTER — Inpatient Hospital Stay: Payer: Medicare Other | Admitting: Internal Medicine

## 2022-05-15 ENCOUNTER — Encounter (HOSPITAL_COMMUNITY): Payer: Self-pay | Admitting: Family Medicine

## 2022-05-15 DIAGNOSIS — C8589 Other specified types of non-Hodgkin lymphoma, extranodal and solid organ sites: Secondary | ICD-10-CM | POA: Diagnosis not present

## 2022-05-15 DIAGNOSIS — R627 Adult failure to thrive: Secondary | ICD-10-CM

## 2022-05-15 DIAGNOSIS — R4182 Altered mental status, unspecified: Secondary | ICD-10-CM | POA: Diagnosis not present

## 2022-05-15 LAB — BASIC METABOLIC PANEL
Anion gap: 11 (ref 5–15)
BUN: 15 mg/dL (ref 8–23)
CO2: 25 mmol/L (ref 22–32)
Calcium: 8.2 mg/dL — ABNORMAL LOW (ref 8.9–10.3)
Chloride: 109 mmol/L (ref 98–111)
Creatinine, Ser: 0.63 mg/dL (ref 0.44–1.00)
GFR, Estimated: >60 mL/min (ref >60)
Glucose, Bld: 167 mg/dL — ABNORMAL HIGH (ref 70–99)
Potassium: 3.7 mmol/L (ref 3.5–5.1)
Sodium: 145 mmol/L (ref 135–145)

## 2022-05-15 LAB — MRSA NEXT GEN BY PCR, NASAL: MRSA by PCR Next Gen: NOT DETECTED

## 2022-05-15 LAB — CBC
HCT: 34 % — ABNORMAL LOW (ref 36.0–46.0)
Hemoglobin: 10.1 g/dL — ABNORMAL LOW (ref 12.0–15.0)
MCH: 28.5 pg (ref 26.0–34.0)
MCHC: 29.7 g/dL — ABNORMAL LOW (ref 30.0–36.0)
MCV: 96 fL (ref 80.0–100.0)
Platelets: 258 10*3/uL (ref 150–400)
RBC: 3.54 MIL/uL — ABNORMAL LOW (ref 3.87–5.11)
RDW: 14.1 % (ref 11.5–15.5)
WBC: 8 10*3/uL (ref 4.0–10.5)
nRBC: 0 % (ref 0.0–0.2)

## 2022-05-15 LAB — GLUCOSE, CAPILLARY
Glucose-Capillary: 156 mg/dL — ABNORMAL HIGH (ref 70–99)
Glucose-Capillary: 145 mg/dL — ABNORMAL HIGH (ref 70–99)

## 2022-05-15 MED ORDER — LORAZEPAM 1 MG PO TABS: 1.0000 mg | ORAL_TABLET | ORAL | Status: DC | PRN

## 2022-05-15 MED ORDER — BIOTENE DRY MOUTH MT LIQD: 15.0000 mL | OROMUCOSAL | Status: DC | PRN

## 2022-05-15 MED ORDER — DEXAMETHASONE 4 MG PO TABS: 4.0000 mg | ORAL_TABLET | Freq: Three times a day (TID) | ORAL | 0 refills | Status: AC

## 2022-05-15 MED ORDER — LORAZEPAM 2 MG/ML IJ SOLN: 1.0000 mg | INTRAMUSCULAR | Status: DC | PRN

## 2022-05-15 MED ORDER — MORPHINE SULFATE (CONCENTRATE) 10 MG/0.5ML PO SOLN: 5.0000 mg | ORAL | Status: DC | PRN

## 2022-05-15 MED ORDER — IRBESARTAN 150 MG PO TABS: 150.0000 mg | ORAL_TABLET | Freq: Every day | ORAL | Status: DC

## 2022-05-15 MED ORDER — LORAZEPAM 1 MG PO TABS: 1.0000 mg | ORAL_TABLET | ORAL | 0 refills | Status: AC | PRN

## 2022-05-15 MED ORDER — MORPHINE SULFATE (CONCENTRATE) 10 MG/0.5ML PO SOLN: 5.0000 mg | ORAL | 0 refills | Status: AC | PRN

## 2022-05-15 MED ORDER — LORAZEPAM 2 MG/ML PO CONC
1.0000 mg | ORAL | Status: DC | PRN
Start: 2022-05-15 — End: 2022-05-15

## 2022-05-15 NOTE — Consult Note (Signed)
[facility] Consult Note  Patient Care Team: Karleen Hampshire., MD as PCP - General (Internal Medicine)  CHIEF COMPLAINTS/PURPOSE OF CONSULTATION:  CNS Lymphoma  HISTORY OF PRESENTING ILLNESS:  Isabel Gibbs 76 y.o. female presented to medical attention with continued impairment in cognition, failure to thrive picture.  She has declined with regards to functional independence, is no longer speaking much.  Is now fully reliant on family for all ADLs.  Even swallowing has been compromised.   MEDICAL HISTORY:  Past Medical History:  Diagnosis Date   Hypertension    Prediabetes    Trigeminal neuralgia of right side of face     SURGICAL HISTORY: Past Surgical History:  Procedure Laterality Date   APPLICATION OF CRANIAL NAVIGATION Left 01/02/2022   Procedure: APPLICATION OF CRANIAL NAVIGATION;  Surgeon: Judith Part, MD;  Location: [place];  Service: Neurosurgery;  Laterality: Left;   CEREBRAL MICROVASCULAR DECOMPRESSION Right 08/02/2018   [facility]   FRAMELESS  BIOPSY WITH BRAINLAB Left 01/02/2022   Procedure: LEFT BRAIN  BIOPSY WITH Lucky Rathke;  Surgeon: Judith Part, MD;  Location: [place];  Service: Neurosurgery;  Laterality: Left;   IR IMAGING GUIDED PORT INSERTION  03/04/2022   RADIOLOGY WITH ANESTHESIA N/A 01/01/2022   Procedure: MRI of Brain with and without contrast;  Surgeon: Radiologist, Medication, MD;  Location: [place];  Service: Radiology;  Laterality: N/A;    SOCIAL HISTORY: Social History   Socioeconomic History   Marital status: Widowed    Spouse name: Not on file   Number of children: Not on file   Years of education: Not on file   Highest education level: Not on file  Occupational History   Not on file  Tobacco Use   Smoking status: Former    Types: Cigarettes    Quit date: 2000    Years since quitting: 23.3   Smokeless tobacco: Never  Vaping Use   Vaping Use: Never used  Substance and Sexual Activity   Alcohol use: Never   Drug  use: Never   Sexual activity: Not on file  Other Topics Concern   Not on file  Social History Narrative   Not on file   Social Determinants of Health   Financial Resource Strain: Not on file  Food Insecurity: Not on file  Transportation Needs: Not on file  Physical Activity: Not on file  Stress: Not on file  Social Connections: Not on file  Intimate Partner Violence: Not on file    FAMILY HISTORY: Family History  Problem Relation Age of Onset   Heart failure Brother    Diabetes Brother    Leukemia Brother     ALLERGIES:  is allergic to codeine, lisinopril, and hydrocodone-acetaminophen.  MEDICATIONS:  Current Facility-Administered Medications  Medication Dose Route Frequency Provider Last Rate Last Admin   antiseptic oral rinse (BIOTENE) solution 15 mL  15 mL Topical PRN Regalado, Belkys A, MD       Chlorhexidine Gluconate Cloth 2 % PADS 6 each  6 each Topical Daily Tu, Ching T, DO   6 each at 05/15/22 0000   dexamethasone (DECADRON) injection 4 mg  4 mg Intravenous Q6H Tu, Ching T, DO   4 mg at 05/15/22 0520   gabapentin (NEURONTIN) capsule 900 mg  900 mg Oral BID Tu, Ching T, DO       LORazepam (ATIVAN) tablet 1 mg  1 mg Oral Q4H PRN Regalado, Belkys A, MD       Or  LORazepam (ATIVAN) 2 MG/ML concentrated solution 1 mg  1 mg Sublingual Q4H PRN Regalado, Belkys A, MD       Or   LORazepam (ATIVAN) injection 1 mg  1 mg Intravenous Q4H PRN Regalado, Belkys A, MD       morphine CONCENTRATE 10 MG/0.5ML oral solution 5 mg  5 mg Oral Q2H PRN Regalado, Belkys A, MD       Or   morphine CONCENTRATE 10 MG/0.5ML oral solution 5 mg  5 mg Sublingual Q2H PRN Regalado, Belkys A, MD       sodium chloride flush (NS) 0.9 % injection 10-40 mL  10-40 mL Intracatheter PRN Tu, Ching T, DO        REVIEW OF SYSTEMS:   Limited by cognitive impairment   PHYSICAL EXAMINATION: Vitals:   05/15/22 0507 05/15/22 0826  BP: (!) 149/72 (!) 150/60  Pulse: 67 69  Resp: 18 14  Temp: 98.1 F (36.7  C) 98.3 F (36.8 C)  SpO2: 100% 100%   KPS: 50. General: Bedbound Head: Normal EENT: No conjunctival injection or scleral icterus. Oral mucosa moist Lungs: Resp effort normal Cardiac: Regular rate and rhythm Abdomen: Soft, non-distended abdomen Skin: No rashes cyanosis or petechiae. Extremities: No clubbing or edema  NEUROLOGIC EXAM: Mental Status: Awake, alert, attentive to examiner. Globally dysphasic Cranial Nerves: Visual acuity is grossly normal. Visual fields are full. Extra-ocular movements intact. No ptosis. Face is symmetric, tongue midline. Motor: Power is impaired in all limbs, right more than left.  Sensory: Intact to light touch and temperature Gait: Non ambulatory   LABORATORY DATA:  I have reviewed the data as listed Lab Results  Component Value Date   WBC 8.0 05/15/2022   HGB 10.1 (L) 05/15/2022   HCT 34.0 (L) 05/15/2022   MCV 96.0 05/15/2022   PLT 258 05/15/2022   Recent Labs    04/13/22 0406 04/13/22 2242 04/15/22 0358 04/16/22 0159 04/18/22 0230 05/14/22 1414 05/15/22 0421  NA 142 142 142   < > 141 144 145  K 4.6 3.5 3.3*   < > 4.4 3.0* 3.7  CL 104 106 103   < > 104 106 109  CO2 30 31 33*   < > 33* 29 25  GLUCOSE 174* 124* 124*   < > 142* 133* 167*  BUN '23 23 21   '$ < > '16 14 15  '$ CREATININE 1.13* 1.25* 1.11*   < > 1.04* 0.61 0.63  CALCIUM 8.3* 8.1* 7.9*   < > 7.9* 8.4* 8.2*  GFRNONAA 50* 44* 51*   < > 55* >60 >60  PROT 5.7*  --  4.7*  --   --  5.7*  --   ALBUMIN 2.8* 2.5* 2.1*  --   --  2.7*  --   AST 17  --  16  --   --  14*  --   ALT 39  --  25  --   --  13  --   ALKPHOS 77  --  63  --   --  81  --   BILITOT 0.9  --  0.6  --   --  1.0  --    < > = values in this interval not displayed.    RADIOGRAPHIC STUDIES: I have personally reviewed the radiological images as listed and agreed with the findings in the report. DG Chest 2 View  Result Date: 05/14/2022 CLINICAL DATA:  Suspect sepsis. EXAM: CHEST - 2 VIEW COMPARISON:  Chest x-ray  04/14/2022 FINDINGS: Right chest port catheter tip projects over the SVC. The heart is mildly enlarged, unchanged. Small bilateral pleural effusions are present. No evidence for pneumothorax. No acute fractures are seen. IMPRESSION: 1. Small bilateral pleural effusions. 2. Left basilar atelectasis/airspace disease. 3. Cardiomegaly. Electronically Signed   By: Ronney Asters M.D.   On: 05/14/2022 15:47   CT Head Wo Contrast  Result Date: 05/14/2022 CLINICAL DATA:  Altered level of consciousness, history of CNS lymphoma EXAM: CT HEAD WITHOUT CONTRAST TECHNIQUE: Contiguous axial images were obtained from the base of the skull through the vertex without intravenous contrast. RADIATION DOSE REDUCTION: This exam was performed according to the departmental dose-optimization program which includes automated exposure control, adjustment of the mA and/or kV according to patient size and/or use of iterative reconstruction technique. COMPARISON:  03/05/2022, 04/12/2022 FINDINGS: Brain: Since the previous MRI, there has been significant progression of disease with marked enlargement of the lobular bilateral frontal lobe mass seen on previous examinations. On today's examination, this multi lobe mass centered in the left frontal lobe extends into the left basal ganglia and across the corpus callosum, measuring up to 4.4 x 5.7 by 6.2 cm, reference image 17/3 and image 28/5. On previous MRI this mass measured 3.7 x 2.1 x 1.8 cm. There is significant surrounding vasogenic edema, with marked sulcal effacement throughout the left frontal lobe. There is subfalcine herniation, and rightward midline shift measuring up to approximately 1.8 cm at the level of the septum pellucidum. No evidence of acute infarct or hemorrhage. No acute extra-axial fluid collection. Vascular: No hyperdense vessel or unexpected calcification. Skull: Normal. Negative for fracture or focal lesion. Sinuses/Orbits: No acute finding. Other: None. IMPRESSION: 1.  Marked progression of a multilobular intracranial mass as described above, centered in the left frontal lobe, consistent with patient's history of CNS lymphoma. There is been significant enlargement of this mass since MRI performed 04/12/2022, with marked surrounding vasogenic edema, sulcal effacement, and rightward midline shift and subfalcine herniation. 2. No evidence of acute infarct or hemorrhage. Critical Value/emergent results were called by telephone at the time of interpretation on 05/14/2022 at 5:14 pm to provider [facility] , who verbally acknowledged these results. Electronically Signed   By: Randa Ngo M.D.   On: 05/14/2022 17:16   DG Swallowing Func-Speech Pathology  Result Date: 04/16/2022 Table formatting from the original result was not included. Objective Swallowing Evaluation: Type of Study: MBS-Modified Barium Swallow Study  Patient Details Name: Isabel Gibbs MRN: [id number] Date of Birth: 1946-06-12 Today's Date: 04/16/2022 Time: SLP Start Time (ACUTE ONLY): 1400 -SLP Stop Time (ACUTE ONLY): 1435 SLP Time Calculation (min) (ACUTE ONLY): 35 min Past Medical History: Past Medical History: Diagnosis Date  Hypertension   Prediabetes   Trigeminal neuralgia of right side of face  Past Surgical History: Past Surgical History: Procedure Laterality Date  APPLICATION OF CRANIAL NAVIGATION Left 03/30/6833  Procedure: APPLICATION OF CRANIAL NAVIGATION;  Surgeon: Judith Part, MD;  Location: [place];  Service: Neurosurgery;  Laterality: Left;  CEREBRAL MICROVASCULAR DECOMPRESSION Right 08/02/2018  [facility]  FRAMELESS  BIOPSY WITH BRAINLAB Left 01/02/2022  Procedure: LEFT BRAIN  BIOPSY WITH Lucky Rathke;  Surgeon: Judith Part, MD;  Location: [place];  Service: Neurosurgery;  Laterality: Left;  IR IMAGING GUIDED PORT INSERTION  03/04/2022  RADIOLOGY WITH ANESTHESIA N/A 01/01/2022  Procedure: MRI of Brain with and without contrast;  Surgeon: Radiologist, Medication, MD;  Location: [place];  Service:  Radiology;  Laterality: N/A; HPI: Patient is a 76 y.o.  female with PMH: HTN, prediabetes, trigeminal neuralgia of right side of face, who presented to the hospital with several days lethargy, confusion, fever, chills. She was found to meet criteria for sepsis with unclear source. 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 CXR showed Increased density in the right upper and left lower lung fields may  suggest atelectasis/pneumonia.  Pt underwent BSE on Monday 4/17 which pt passed easily - Swallow evaluation reordered- therefore MBS advised and order obtained.  Subjective: pleasant, alert  Recommendations for follow up therapy are one component of a multi-disciplinary discharge planning process, led by the attending physician.  Recommendations may be updated based on patient status, additional functional criteria and insurance authorization. Assessment / Plan / Recommendation   04/16/2022   3:48 PM Clinical Impressions Clinical Impression Impression             Patient presents with mild sensorimotor oropharyngeal and mild obstructive cervical esophageal dysphagia.   She demonstrates decreased oral propulsion strength allowing separation of tablet from liquids. Tablet then spilled from oral cavity to vallecular space with uncertain pt awareness.  Further liquid swallows did not transit tablet into esophagus - pudding bolus effective.  Trial of small whole piece of graham cracker (to mimic pill) swallowed with liquid than lodged at pyriform sinus. Patient appears with large bony protrusion that impinges into cervical esophagus- likely contributing to retention of solids. Head turn to the right did not improve pharyngeal/cervical esophageal clearance.  Recommend medications with puree - crushed if large and not contraindicated. Pt also presents with delayed pharyngeal swallow over epiglottis at times allowing  inconsistent laryngeal penetration of thin/ultrathin. Chin tuck posture did not prevent and was difficult for pt to perform.   Variable pharyngeal retention of liquids noted without sensation.  Pt did not cough during entire MBS - despite trace penetration to vocal cords.  recommend continue dys3/thin with strict precautions including intermittent throat clear after liquid swallows. SLP to follow up for dysphagia management, education, strengthening exercises. Using teach back, pt educated to findings/recommendations. Gibbs follow clinically also for po tolerance, indication for acute diet modification for airway protection while pt is ill.   SLP Visit Diagnosis Dysphagia, pharyngoesophageal phase (R13.14);Dysphagia, oropharyngeal phase (R13.12) Impact on safety and function Moderate aspiration risk;Mild aspiration risk     04/16/2022   3:48 PM Treatment Recommendations Treatment Recommendations Therapy as outlined in treatment plan below     04/16/2022   3:49 PM Prognosis Prognosis for Safe Diet Advancement Good   04/16/2022   3:48 PM Diet Recommendations SLP Diet Recommendations Dysphagia 3 (Mech soft) solids;Thin liquid Liquid Administration via Cup;Straw Medication Administration Whole meds with puree Compensations Small sips/bites;Slow rate;Other (Comment)     04/16/2022   3:48 PM Other Recommendations Oral Care Recommendations Oral care QID Follow Up Recommendations Skilled nursing-short term rehab (<3 hours/day) Assistance recommended at discharge Frequent or constant Supervision/Assistance Functional Status Assessment Patient has had a recent decline in their functional status and demonstrates the ability to make significant improvements in function in a reasonable and predictable amount of time.   04/16/2022   3:48 PM Frequency and Duration  Speech Therapy Frequency (ACUTE ONLY) min 2x/week Treatment Duration 2 weeks     04/16/2022   3:13 PM Oral Phase Oral Phase Impaired Oral - Nectar Cup WFL Oral - Thin Teaspoon  WFL Oral - Thin Cup WFL Oral - Thin Straw WFL Oral - Puree WFL Oral -  Mech Soft Decreased bolus cohesion;Reduced posterior propulsion Oral - Pill Decreased bolus cohesion;Premature spillage;Lingual/palatal residue;Other (Comment) Oral Phase - Comment barium tablet prematurely spilled into pharynx from oral cavity - after separating from liquid;    04/16/2022   3:26 PM Pharyngeal Phase Pharyngeal Material enters airway, remains ABOVE vocal cords and not ejected out Pharyngeal- Thin Straw Reduced airway/laryngeal closure Pharyngeal- Puree Delayed swallow initiation-vallecula Pharyngeal Material does not enter airway Pharyngeal- Mechanical Soft Delayed swallow initiation-vallecula Pharyngeal Material does not enter airway Pharyngeal- Pill Reduced epiglottic inversion;Pharyngeal residue - valleculae Pharyngeal Material does not enter airway Pharyngeal Comment Inconsistent laryngeal penetration of thin due to decreased timing and adequacy of laryngeal closure, chin tuck did not prevent penetration - and inadequately performed; barium tablet lodged at vallecular space - small solid cracker lodged at pyriform sinus without awareness; Head turn right did not aid pharyngeal clearance; Recommend medications that are at or larger than 13 mm be given with applesauce ( crushed if not contraindicated).  Pt did not aspirate with thin - cued throat clear and re-swallow effective.    04/16/2022   3:33 PM Cervical Esophageal Phase  Cervical Esophageal Phase Impaired - obstructive nature of dysphagia, appearance of prominent cervical spine C4-C6 protruding into posterior cervical esophagus Nectar Cup WFL Thin Teaspoon WFL Thin Cup WFL Thin Straw WFL Puree WFL Mechanical Soft Other (Comment) Pill Other (Comment) [name], MS [facility] SLP Acute Rehab Services Office [phone] Pager [phone] [name] 04/16/2022, 3:52 PM                      ASSESSMENT & PLAN:  Primary CNS Lymphoma  Isabel Gibbs presents with significant  progression of disease.  She has been refractory to chemotherapy and rituximab.  She is not a candidate for futher interventions due to poor functional status, comorbidity.  We recommended transition to hospice, family is agreeable.  Palliative care Gibbs help arrange care with hospice going forward, potentially inpatient.  All questions were answered. The patient knows to call the clinic with any problems, questions or concerns.  The total time spent in the encounter was  60 minutes  and more than 50% was on counseling and review of test results     Ventura Sellers, MD 05/15/2022 12:30 PM

## 2022-05-15 NOTE — Plan of Care (Signed)
  Problem: Education: Goal: Knowledge of General Education information will improve Description: Including pain rating scale, medication(s)/side effects and non-pharmacologic comfort measures Outcome: Progressing   Problem: Clinical Measurements: Goal: Ability to maintain clinical measurements within normal limits will improve Outcome: Progressing Goal: Will remain free from infection Outcome: Progressing Goal: Diagnostic test results will improve Outcome: Progressing Goal: Respiratory complications will improve Outcome: Progressing Goal: Cardiovascular complication will be avoided Outcome: Progressing   Problem: Activity: Goal: Risk for activity intolerance will decrease Outcome: Progressing   Problem: Nutrition: Goal: Adequate nutrition will be maintained Outcome: Progressing   Problem: Elimination: Goal: Will not experience complications related to bowel motility Outcome: Progressing   Problem: Pain Managment: Goal: General experience of comfort will improve Outcome: Progressing   Problem: Safety: Goal: Ability to remain free from injury will improve Outcome: Progressing   Problem: Skin Integrity: Goal: Risk for impaired skin integrity will decrease Outcome: Progressing

## 2022-05-15 NOTE — Evaluation (Signed)
Clinical/Bedside Swallow Evaluation Patient Details  Name: Isabel Gibbs MRN: [id number] Date of Birth: 31-Jan-1946  Today's Date: 05/15/2022 Time: SLP Start Time (ACUTE ONLY): 0900 SLP Stop Time (ACUTE ONLY): 0920 SLP Time Calculation (min) (ACUTE ONLY): 20 min  Past Medical History:  Past Medical History:  Diagnosis Date   Hypertension    Prediabetes    Trigeminal neuralgia of right side of face    Past Surgical History:  Past Surgical History:  Procedure Laterality Date   APPLICATION OF CRANIAL NAVIGATION Left 01/02/2022   Procedure: APPLICATION OF CRANIAL NAVIGATION;  Surgeon: Judith Part, MD;  Location: [place];  Service: Neurosurgery;  Laterality: Left;   CEREBRAL MICROVASCULAR DECOMPRESSION Right 08/02/2018   [facility]   FRAMELESS  BIOPSY WITH BRAINLAB Left 01/02/2022   Procedure: LEFT BRAIN  BIOPSY WITH Lucky Rathke;  Surgeon: Judith Part, MD;  Location: [place];  Service: Neurosurgery;  Laterality: Left;   IR IMAGING GUIDED PORT INSERTION  03/04/2022   RADIOLOGY WITH ANESTHESIA N/A 01/01/2022   Procedure: MRI of Brain with and without contrast;  Surgeon: Radiologist, Medication, MD;  Location: [employer];  Service: Radiology;  Laterality: N/A;   HPI:  Isabel Gibbs is a 76 y.o. 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 Acute encephalopathy is  secondary to worsening of CNS lymphoma with CT head showing marked progression of multilobular intracranial mass with marked vasogenic edema and rightward midline shift. Family do not wish to escalate care. Prior MBS on 4/19 showed mild dysphagia with silent trace penetration and mild obstructive cervical esophageal dysphagia. Recommended to consume Dys 3/thin. Pt able to self feed upon d/c.    Assessment / Plan / Recommendation  Clinical Impression  Pt demonstrates signs of  aspiration across consistencies. Her mental status is altered and ability to follow one step commands is inconsistent. Pt typically cannot cough on command, but her involuntary cough response is weak and delayed after minimal PO trials with increasingly wet vocal quality. Likely pt will aspirate regardless of texture. Offered options of instrumental testing, prophylactic diet modificaiton and outright comfort feeding with known risk of aspiration. Pts son elected to try careful hand feeding of puree and nectar thickened textures with an awareness of risk of aspiration. Diet may be liberated if desired if pts plan of care transitions to full comfort measures. Reported to MD. Will follow chart for needs, but expect no SLP f/u needed ultimately. SLP Visit Diagnosis: Dysphagia, oropharyngeal phase (R13.12)    Aspiration Risk  Severe aspiration risk;Risk for inadequate nutrition/hydration    Diet Recommendation Dysphagia 1 (Puree);Nectar-thick liquid   Liquid Administration via: Cup;Spoon Medication Administration: Crushed with puree Supervision: Full supervision/cueing for compensatory strategies Compensations: Slow rate;Small sips/bites Postural Changes: Seated upright at 90 degrees    Other  Recommendations Oral Care Recommendations: Oral care BID Other Recommendations: Have oral suction available    Recommendations for follow up therapy are one component of a multi-disciplinary discharge planning process, led by the attending physician.  Recommendations may be updated based on patient status, additional functional criteria and insurance authorization.  Follow up Recommendations No SLP follow up      Assistance Recommended at Discharge    Functional Status Assessment Patient has had a recent decline in their functional status and/or demonstrates limited ability to make significant improvements in function in a reasonable and predictable amount of time  Frequency and Duration min 1 x/week  1  week       Prognosis Prognosis for Safe Diet Advancement: Guarded Barriers to Reach Goals: Severity of deficits      Swallow Study   General HPI: Isabel Gibbs is a 76 y.o. 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 Acute encephalopathy is  secondary to worsening of CNS lymphoma with CT head showing marked progression of multilobular intracranial mass with marked vasogenic edema and rightward midline shift. Family do not wish to escalate care. Prior MBS on 4/19 showed mild dysphagia with silent trace penetration and mild obstructive cervical esophageal dysphagia. Recommended to consume Dys 3/thin. Pt able to self feed upon d/c. Type of Study: Bedside Swallow Evaluation Diet Prior to this Study: NPO Temperature Spikes Noted: No Respiratory Status: Nasal cannula History of Recent Intubation: No Behavior/Cognition: Alert;Requires cueing;Doesn't follow directions Oral Cavity Assessment: Within Functional Limits Oral Care Completed by SLP: No Oral Cavity - Dentition: Edentulous Self-Feeding Abilities: Total assist Patient Positioning: Upright in bed Baseline Vocal Quality: Wet Volitional Cough: Weak;Congested;Cognitively unable to elicit Volitional Swallow: Unable to elicit    Oral/Motor/Sensory Function Overall Oral Motor/Sensory Function: Generalized oral weakness   Ice Chips Ice chips: Impaired Presentation: Spoon Pharyngeal Phase Impairments: Cough - Delayed;Suspected delayed Swallow   Thin Liquid Thin Liquid: Impaired Presentation: Cup;Spoon Pharyngeal  Phase Impairments: Suspected delayed Swallow;Wet Vocal Quality;Cough - Delayed    Nectar Thick Nectar Thick Liquid: Not tested   Honey Thick Honey Thick Liquid: Not tested   Puree Puree: Impaired Presentation: Spoon Pharyngeal Phase Impairments: Wet Vocal  Quality;Cough - Delayed   Solid     Solid: Not tested      Lynann Beaver 05/15/2022,9:25 AM

## 2022-05-15 NOTE — Discharge Summary (Signed)
Physician Discharge Summary   Patient: Isabel Gibbs MRN: [id number] DOB: 1946/09/04  Admit date:     05/14/2022  Discharge date: 05/15/22  Discharge Physician: Elmarie Shiley   PCP: Karleen Hampshire., MD   Recommendations at discharge:    Comfort care.   Discharge Diagnoses: Worsening CNS Lymphoma.  Active Problems:   Acute encephalopathy   UTI (urinary tract infection)   CNS lymphoma (HCC)   Hypertension   Multifocal atrial tachycardia (HCC)   Hypokalemia   Pressure injury of skin   Trigeminal neuralgia   Dysphagia   Acute on chronic diastolic CHF (congestive heart failure) ([place])  Resolved Problems:   * No resolved hospital problems. 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  Patient had a recent admission for sepsis due to UTI 8 on 4/21 and possible healthcare associated pneumonia and respiratory failure she was discharged to a facility.  Over the past week she has had a gradual decline.  She is not longer talking unable to feed herself.  CT head showed marked progression of multilobar intracranial mass with marked vasogenic edema and right word midline shift.  Case was discussed with Dr. Mickeal Skinner who recommended palliative care.  Discussed with family they agreed to focus on palliative care and comfort care.  Patient will be transferred to a residential hospice facility.     Assessment and Plan: Acute encephalopathy Secondary to worsening of CNS lymphoma with CT head showing marked progression of multilobular intracranial mass with marked vasogenic edema and rightward midline shift. -Neuro-oncology was consulted by ED physician and does not recommend any further imaging or neurosurgery consult.  Continue Decadron 4 mg q6hr. -Palliative care and residential hospice has  been consulted. -Family agreed to proceed with comfort care, discontinue IV fluids IV antibiotics, plan for residential hospice placement. Continue with oral Decadron  UTI (urinary tract infection) -Presented with indwelling foley catheter. -Plan to proceed with comfort care.  Discontinue IV antibiotics  CNS lymphoma ([place])  recently completed cycle 2 of chemotherapy on 03/30/22 on methotrexate and rituximab Follows with Dr. Mickeal Skinner of oncology.  See plans above regarding palliative care.  Hypertension Continue to hold oral medications  Acute on chronic diastolic CHF (congestive heart failure) ([place]) Received a dose of IV Lasix.  Patient with poor oral intake.  Hold on any further diuretics  Dysphagia Patient had significant risk for aspiration.  Continue with comfort feeding  Trigeminal neuralgia Continue gabapentin for now if able to tolerate  Pressure injury of skin Stage 1 sacral ulcer. Continue to monitor  Hypokalemia Replace  Multifocal atrial tachycardia ([place]) Continue with home Lopressor if able to tolerate p.o.         Consultants: Dr Mickeal Skinner by ED Procedures performed: None Disposition: Hospice care Diet recommendation:  Discharge Diet Orders (From admission, onward)     Start     Ordered   05/15/22 0000  Diet - low sodium heart healthy        05/15/22 1205           Dysphagia type 3   Liquid DISCHARGE MEDICATION: Allergies as of 05/15/2022       Reactions   Codeine Other (See Comments)   Severe GI upset   Lisinopril Cough      Hydrocodone-acetaminophen Nausea Only        Medication List  STOP taking these medications    acetaminophen 500 MG tablet Commonly known as: TYLENOL   alendronate 70 MG tablet Commonly known as: FOSAMAX   Gerhardt's butt cream Crea   irbesartan 150 MG tablet Commonly known as: AVAPRO   Metoprolol Tartrate 75 MG Tabs   Vitamin D (Ergocalciferol) 1.25 MG (50000 UNIT) Caps capsule Commonly known as:  DRISDOL       TAKE these medications    dexamethasone 4 MG tablet Commonly known as: DECADRON Take 1 tablet (4 mg total) by mouth 3 (three) times daily.   gabapentin 300 MG capsule Commonly known as: NEURONTIN Take 900 mg by mouth 2 (two) times daily.   LORazepam 1 MG tablet Commonly known as: ATIVAN Take 1 tablet (1 mg total) by mouth every 4 (four) hours as needed for anxiety.   morphine CONCENTRATE 10 MG/0.5ML Soln concentrated solution Take 0.25 mLs (5 mg total) by mouth every 2 (two) hours as needed for moderate pain (or dyspnea).   Systane Ultra PF 0.4-0.3 % Soln Generic drug: Polyethyl Glyc-Propyl Glyc PF Place 1 drop into both eyes 3 (three) times daily.        Discharge Exam: Filed Weights   05/15/22 0005  Weight: 85.6 kg   General; alert. Said few word  Condition at discharge: poor  The results of significant diagnostics from this hospitalization (including imaging, microbiology, ancillary and laboratory) are listed below for reference.   Imaging Studies: DG Chest 2 View  Result Date: 05/14/2022 CLINICAL DATA:  Suspect sepsis. EXAM: CHEST - 2 VIEW COMPARISON:  Chest x-ray 04/14/2022 FINDINGS: Right chest port catheter tip projects over the SVC. The heart is mildly enlarged, unchanged. Small bilateral pleural effusions are present. No evidence for pneumothorax. No acute fractures are seen. IMPRESSION: 1. Small bilateral pleural effusions. 2. Left basilar atelectasis/airspace disease. 3. Cardiomegaly. Electronically Signed   By: Ronney Asters M.D.   On: 05/14/2022 15:47   CT Head Wo Contrast  Result Date: 05/14/2022 CLINICAL DATA:  Altered level of consciousness, history of CNS lymphoma EXAM: CT HEAD WITHOUT CONTRAST TECHNIQUE: Contiguous axial images were obtained from the base of the skull through the vertex without intravenous contrast. RADIATION DOSE REDUCTION: This exam was performed according to the departmental dose-optimization program which includes  automated exposure control, adjustment of the mA and/or kV according to patient size and/or use of iterative reconstruction technique. COMPARISON:  03/05/2022, 04/12/2022 FINDINGS: Brain: Since the previous MRI, there has been significant progression of disease with marked enlargement of the lobular bilateral frontal lobe mass seen on previous examinations. On today's examination, this multi lobe mass centered in the left frontal lobe extends into the left basal ganglia and across the corpus callosum, measuring up to 4.4 x 5.7 by 6.2 cm, reference image 17/3 and image 28/5. On previous MRI this mass measured 3.7 x 2.1 x 1.8 cm. There is significant surrounding vasogenic edema, with marked sulcal effacement throughout the left frontal lobe. There is subfalcine herniation, and rightward midline shift measuring up to approximately 1.8 cm at the level of the septum pellucidum. No evidence of acute infarct or hemorrhage. No acute extra-axial fluid collection. Vascular: No hyperdense vessel or unexpected calcification. Skull: Normal. Negative for fracture or focal lesion. Sinuses/Orbits: No acute finding. Other: None. IMPRESSION: 1. Marked progression of a multilobular intracranial mass as described above, centered in the left frontal lobe, consistent with patient's history of CNS lymphoma. There is been significant enlargement of this mass since MRI performed 04/12/2022, with marked surrounding vasogenic  edema, sulcal effacement, and rightward midline shift and subfalcine herniation. 2. No evidence of acute infarct or hemorrhage. Critical Value/emergent results were called by telephone at the time of interpretation on 05/14/2022 at 5:14 pm to provider [facility] , who verbally acknowledged these results. Electronically Signed   By: Randa Ngo M.D.   On: 05/14/2022 17:16   DG Swallowing Func-Speech Pathology  Result Date: 04/16/2022 Table formatting from the original result was not included. Objective  Swallowing Evaluation: Type of Study: MBS-Modified Barium Swallow Study  Patient Details Name: Isabel Gibbs MRN: [id number] Date of Birth: [birth year] Today's Date: 04/16/2022 Time: SLP Start Time (ACUTE ONLY): 1400 -SLP Stop Time (ACUTE ONLY): 1435 SLP Time Calculation (min) (ACUTE ONLY): 35 min Past Medical History: Past Medical History: Diagnosis Date  Hypertension   Prediabetes   Trigeminal neuralgia of right side of face  Past Surgical History: Past Surgical History: Procedure Laterality Date  APPLICATION OF CRANIAL NAVIGATION Left 07/06/2955  Procedure: APPLICATION OF CRANIAL NAVIGATION;  Surgeon: Judith Part, MD;  Location: [place];  Service: Neurosurgery;  Laterality: Left;  CEREBRAL MICROVASCULAR DECOMPRESSION Right 08/02/2018  [facility]  FRAMELESS  BIOPSY WITH BRAINLAB Left 01/02/2022  Procedure: LEFT BRAIN  BIOPSY WITH Lucky Rathke;  Surgeon: Judith Part, MD;  Location: [place];  Service: Neurosurgery;  Laterality: Left;  IR IMAGING GUIDED PORT INSERTION  03/04/2022  RADIOLOGY WITH ANESTHESIA N/A 01/01/2022  Procedure: MRI of Brain with and without contrast;  Surgeon: Radiologist, Medication, MD;  Location: [place];  Service: Radiology;  Laterality: N/A; HPI: Patient is a 76 y.o. female with PMH: HTN, prediabetes, trigeminal neuralgia of right side of face, who presented to the hospital with several days lethargy, confusion, fever, chills. She was found to meet criteria for sepsis with unclear source. 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 CXR showed Increased density in the right upper and left lower lung fields may  suggest atelectasis/pneumonia.  Pt underwent BSE on Monday 4/17 which pt passed easily - Swallow evaluation reordered- therefore MBS advised and order obtained.  Subjective: pleasant, alert  Recommendations for follow up therapy are one component of a multi-disciplinary discharge planning  process, led by the attending physician.  Recommendations may be updated based on patient status, additional functional criteria and insurance authorization. Assessment / Plan / Recommendation   04/16/2022   3:48 PM Clinical Impressions Clinical Impression Impression             Patient presents with mild sensorimotor oropharyngeal and mild obstructive cervical esophageal dysphagia.   She demonstrates decreased oral propulsion strength allowing separation of tablet from liquids. Tablet then spilled from oral cavity to vallecular space with uncertain pt awareness.  Further liquid swallows did not transit tablet into esophagus - pudding bolus effective.  Trial of small whole piece of graham cracker (to mimic pill) swallowed with liquid than lodged at pyriform sinus. Patient appears with large bony protrusion that impinges into cervical esophagus- likely contributing to retention of solids. Head turn to the right did not improve pharyngeal/cervical esophageal clearance.  Recommend medications with puree - crushed if large and not contraindicated. Pt also presents with delayed pharyngeal swallow over epiglottis at times allowing inconsistent laryngeal penetration of thin/ultrathin. Chin tuck posture did not prevent and was difficult for pt to perform.   Variable pharyngeal retention of liquids noted without sensation.  Pt did not cough during entire MBS - despite trace penetration to vocal  cords.  recommend continue dys3/thin with strict precautions including intermittent throat clear after liquid swallows. SLP to follow up for dysphagia management, education, strengthening exercises. Using teach back, pt educated to findings/recommendations. Will follow clinically also for po tolerance, indication for acute diet modification for airway protection while pt is ill.   SLP Visit Diagnosis Dysphagia, pharyngoesophageal phase (R13.14);Dysphagia, oropharyngeal phase (R13.12) Impact on safety and function Moderate aspiration  risk;Mild aspiration risk     04/16/2022   3:48 PM Treatment Recommendations Treatment Recommendations Therapy as outlined in treatment plan below     04/16/2022   3:49 PM Prognosis Prognosis for Safe Diet Advancement Good   04/16/2022   3:48 PM Diet Recommendations SLP Diet Recommendations Dysphagia 3 (Mech soft) solids;Thin liquid Liquid Administration via Cup;Straw Medication Administration Whole meds with puree Compensations Small sips/bites;Slow rate;Other (Comment)     04/16/2022   3:48 PM Other Recommendations Oral Care Recommendations Oral care QID Follow Up Recommendations Skilled nursing-short term rehab (<3 hours/day) Assistance recommended at discharge Frequent or constant Supervision/Assistance Functional Status Assessment Patient has had a recent decline in their functional status and demonstrates the ability to make significant improvements in function in a reasonable and predictable amount of time.   04/16/2022   3:48 PM Frequency and Duration  Speech Therapy Frequency (ACUTE ONLY) min 2x/week Treatment Duration 2 weeks     04/16/2022   3:13 PM Oral Phase Oral Phase Impaired Oral - Nectar Cup WFL Oral - Thin Teaspoon WFL Oral - Thin Cup WFL Oral - Thin Straw WFL Oral - Puree WFL Oral - Mech Soft Decreased bolus cohesion;Reduced posterior propulsion Oral - Pill Decreased bolus cohesion;Premature spillage;Lingual/palatal residue;Other (Comment) Oral Phase - Comment barium tablet prematurely spilled into pharynx from oral cavity - after separating from liquid;    04/16/2022   3:26 PM Pharyngeal Phase Pharyngeal Material enters airway, remains ABOVE vocal cords and not ejected out Pharyngeal- Thin Straw Reduced airway/laryngeal closure Pharyngeal- Puree Delayed swallow initiation-vallecula Pharyngeal Material does not enter airway Pharyngeal- Mechanical Soft Delayed swallow initiation-vallecula Pharyngeal Material does not enter airway Pharyngeal- Pill Reduced epiglottic inversion;Pharyngeal residue -  valleculae Pharyngeal Material does not enter airway Pharyngeal Comment Inconsistent laryngeal penetration of thin due to decreased timing and adequacy of laryngeal closure, chin tuck did not prevent penetration - and inadequately performed; barium tablet lodged at vallecular space - small solid cracker lodged at pyriform sinus without awareness; Head turn right did not aid pharyngeal clearance; Recommend medications that are at or larger than 13 mm be given with applesauce ( crushed if not contraindicated).  Pt did not aspirate with thin - cued throat clear and re-swallow effective.    04/16/2022   3:33 PM Cervical Esophageal Phase  Cervical Esophageal Phase Impaired - obstructive nature of dysphagia, appearance of prominent cervical spine C4-C6 protruding into posterior cervical esophagus Nectar Cup WFL Thin Teaspoon WFL Thin Cup WFL Thin Straw WFL Puree WFL Mechanical Soft Other (Comment) Pill Other (Comment) [name], MS [facility] SLP Acute Rehab Services Office [phone] Pager [phone] [name] 04/16/2022, 3:52 PM                      Microbiology: Results for orders placed or performed during the hospital encounter of 05/14/22  MRSA Next Gen by PCR, Nasal     Status: None   Collection Time: 05/15/22  1:14 AM   Specimen: Nasal Mucosa; Nasal Swab  Result Value Ref Range Status   MRSA by PCR Next Gen NOT  DETECTED NOT DETECTED Final    Comment: (NOTE) The GeneXpert MRSA Assay (FDA approved for NASAL specimens only), is one component of a comprehensive MRSA colonization surveillance program. It is not intended to diagnose MRSA infection nor to guide or monitor treatment for MRSA infections. Test performance is not FDA approved in patients less than 19 years old. Performed at [facility], [place] [address], [place], [place] [address]     Labs: CBC: Recent Labs  Lab 05/14/22 1414 05/15/22 0421  WBC 9.5 8.0  NEUTROABS 6.7  --   HGB 10.2* 10.1*  HCT 34.2* 34.0*   MCV 96.9 96.0  PLT 268 818   Basic Metabolic Panel: Recent Labs  Lab 05/14/22 1414 05/15/22 0421  NA 144 145  K 3.0* 3.7  CL 106 109  CO2 29 25  GLUCOSE 133* 167*  BUN 14 15  CREATININE 0.61 0.63  CALCIUM 8.4* 8.2*   Liver Function Tests: Recent Labs  Lab 05/14/22 1414  AST 14*  ALT 13  ALKPHOS 81  BILITOT 1.0  PROT 5.7*  ALBUMIN 2.7*   CBG: Recent Labs  Lab 05/15/22 0723 05/15/22 1151  GLUCAP 156* 145*    Discharge time spent: greater than 30 minutes.  Signed: Elmarie Shiley, MD [facility] 05/15/2022

## 2022-05-15 NOTE — TOC Progression Note (Signed)
Transition of Care [facility]) - Progression Note    Patient Details  Name: Isabel Gibbs MRN: [id number] Date of Birth: 01-16-1946  Transition of [facility]) CM/SW Contact  Purcell Mouton, RN Phone Number: 05/15/2022, 12:56 PM  Clinical Narrative:    Pt discharging to [facility]. PTAR was called.    Expected Discharge Plan: [place] Barriers to Discharge: No Barriers Identified  Expected Discharge Plan and Services Expected Discharge Plan: [place]       Living arrangements for the past 2 months: [place] Expected Discharge Date: 05/15/22                                     Social Determinants of Health (SDOH) Interventions    Readmission Risk Interventions    03/11/2022    1:36 PM  Readmission Risk Prevention Plan  Transportation Screening Complete  PCP or Specialist Appt within 5-7 Days Complete  Home Care Screening Complete  Medication Review (RN CM) Complete

## 2022-05-15 NOTE — Progress Notes (Signed)
   This pt was referred to hospice care from ED CM [place]. I have been able to connect with the son Merry Proud discuss hospice  and comfort care approach. He is in agreement and would like to proceed with referral request and transition to the Hospice home in [facility]. Our MD has reviewed her and accepted her as a pt. The family has completed the paperwork and we can accept pt transfer today.   TOC notified and will work on arrangements for transfer. Webb Silversmith RN [phone]

## 2022-05-16 LAB — URINE CULTURE: Culture: >=100000 — AB

## 2022-06-03 ENCOUNTER — Other Ambulatory Visit: Payer: Self-pay | Admitting: Internal Medicine

## 2022-06-28 DEATH — deceased
